# Patient Record
Sex: Female | Born: 1955 | Race: White | Hispanic: No | Smoking: Never smoker
Health system: Southern US, Community
[De-identification: ages and names within clinical notes are randomized; demographics above are authoritative.]

## PROBLEM LIST (undated history)

## (undated) DIAGNOSIS — C801 Malignant (primary) neoplasm, unspecified: Secondary | ICD-10-CM

## (undated) DIAGNOSIS — T7840XA Allergy, unspecified, initial encounter: Secondary | ICD-10-CM

## (undated) DIAGNOSIS — E871 Hypo-osmolality and hyponatremia: Secondary | ICD-10-CM

## (undated) DIAGNOSIS — F419 Anxiety disorder, unspecified: Secondary | ICD-10-CM

## (undated) DIAGNOSIS — K7030 Alcoholic cirrhosis of liver without ascites: Secondary | ICD-10-CM

## (undated) DIAGNOSIS — M1990 Unspecified osteoarthritis, unspecified site: Secondary | ICD-10-CM

## (undated) DIAGNOSIS — I8500 Esophageal varices without bleeding: Secondary | ICD-10-CM

## (undated) DIAGNOSIS — K219 Gastro-esophageal reflux disease without esophagitis: Secondary | ICD-10-CM

## (undated) DIAGNOSIS — L409 Psoriasis, unspecified: Secondary | ICD-10-CM

## (undated) DIAGNOSIS — I10 Essential (primary) hypertension: Secondary | ICD-10-CM

## (undated) HISTORY — DX: Gastro-esophageal reflux disease without esophagitis: K21.9

## (undated) HISTORY — DX: Psoriasis, unspecified: L40.9

## (undated) HISTORY — PX: UPPER GASTROINTESTINAL ENDOSCOPY: SHX188

## (undated) HISTORY — DX: Anxiety disorder, unspecified: F41.9

## (undated) HISTORY — DX: Unspecified osteoarthritis, unspecified site: M19.90

## (undated) HISTORY — PX: BLADDER SURGERY: SHX569

## (undated) HISTORY — DX: Hypo-osmolality and hyponatremia: E87.1

## (undated) HISTORY — DX: Malignant (primary) neoplasm, unspecified: C80.1

## (undated) HISTORY — DX: Esophageal varices without bleeding: I85.00

## (undated) HISTORY — PX: BREAST REDUCTION SURGERY: SHX8

## (undated) HISTORY — DX: Allergy, unspecified, initial encounter: T78.40XA

## (undated) HISTORY — DX: Essential (primary) hypertension: I10

## (undated) HISTORY — DX: Alcoholic cirrhosis of liver without ascites: K70.30

## (undated) HISTORY — PX: BASAL CELL CARCINOMA EXCISION: SHX1214

## (undated) HISTORY — PX: REDUCTION MAMMAPLASTY: SUR839

## (undated) HISTORY — PX: COSMETIC SURGERY: SHX468

## (undated) HISTORY — PX: COLONOSCOPY: SHX174

---

## 2002-06-13 ENCOUNTER — Other Ambulatory Visit: Admission: RE | Admit: 2002-06-13 | Discharge: 2002-06-13 | Payer: Self-pay | Admitting: Family Medicine

## 2002-06-19 ENCOUNTER — Encounter: Admission: RE | Admit: 2002-06-19 | Discharge: 2002-06-19 | Payer: Self-pay | Admitting: Family Medicine

## 2002-06-19 ENCOUNTER — Encounter: Payer: Self-pay | Admitting: Family Medicine

## 2002-06-27 ENCOUNTER — Encounter: Payer: Self-pay | Admitting: Family Medicine

## 2002-06-27 ENCOUNTER — Encounter: Admission: RE | Admit: 2002-06-27 | Discharge: 2002-06-27 | Payer: Self-pay | Admitting: Family Medicine

## 2002-07-27 ENCOUNTER — Encounter: Payer: Self-pay | Admitting: Internal Medicine

## 2002-07-27 ENCOUNTER — Ambulatory Visit (HOSPITAL_COMMUNITY): Admission: RE | Admit: 2002-07-27 | Discharge: 2002-07-27 | Payer: Self-pay | Admitting: Internal Medicine

## 2003-07-31 ENCOUNTER — Other Ambulatory Visit: Admission: RE | Admit: 2003-07-31 | Discharge: 2003-07-31 | Payer: Self-pay | Admitting: Family Medicine

## 2003-08-07 ENCOUNTER — Encounter: Admission: RE | Admit: 2003-08-07 | Discharge: 2003-08-07 | Payer: Self-pay | Admitting: Family Medicine

## 2004-05-26 ENCOUNTER — Ambulatory Visit: Payer: Self-pay | Admitting: Family Medicine

## 2004-07-28 ENCOUNTER — Ambulatory Visit: Payer: Self-pay | Admitting: Family Medicine

## 2004-07-31 ENCOUNTER — Other Ambulatory Visit: Admission: RE | Admit: 2004-07-31 | Discharge: 2004-07-31 | Payer: Self-pay | Admitting: Family Medicine

## 2004-07-31 ENCOUNTER — Ambulatory Visit: Payer: Self-pay | Admitting: Family Medicine

## 2004-08-25 ENCOUNTER — Encounter: Admission: RE | Admit: 2004-08-25 | Discharge: 2004-08-25 | Payer: Self-pay | Admitting: Family Medicine

## 2005-08-27 ENCOUNTER — Encounter: Admission: RE | Admit: 2005-08-27 | Discharge: 2005-08-27 | Payer: Self-pay | Admitting: Family Medicine

## 2006-04-21 ENCOUNTER — Ambulatory Visit: Payer: Self-pay | Admitting: Family Medicine

## 2006-06-03 ENCOUNTER — Ambulatory Visit: Payer: Self-pay | Admitting: Family Medicine

## 2006-06-11 ENCOUNTER — Ambulatory Visit: Payer: Self-pay | Admitting: Family Medicine

## 2006-07-30 ENCOUNTER — Ambulatory Visit: Payer: Self-pay | Admitting: Family Medicine

## 2006-07-30 LAB — CONVERTED CEMR LAB
ALT: 43 units/L — ABNORMAL HIGH (ref 0–40)
AST: 300 units/L — ABNORMAL HIGH (ref 0–37)
Albumin: 3.9 g/dL (ref 3.5–5.2)
Alkaline Phosphatase: 122 units/L — ABNORMAL HIGH (ref 39–117)
BUN: 9 mg/dL (ref 6–23)
Basophils Absolute: 0.4 10*3/uL — ABNORMAL HIGH (ref 0.0–0.1)
Basophils Relative: 4.1 % — ABNORMAL HIGH (ref 0.0–1.0)
CO2: 30 meq/L (ref 19–32)
Calcium: 9.2 mg/dL (ref 8.4–10.5)
Chloride: 98 meq/L (ref 96–112)
Cholesterol: 349 mg/dL (ref 0–200)
Creatinine, Ser: 0.8 mg/dL (ref 0.4–1.2)
Direct LDL: 235.3 mg/dL
Eosinophils Absolute: 0.1 10*3/uL (ref 0.0–0.6)
Eosinophils Relative: 0.6 % (ref 0.0–5.0)
GFR calc Af Amer: 97 mL/min
GFR calc non Af Amer: 80 mL/min
Glucose, Bld: 107 mg/dL — ABNORMAL HIGH (ref 70–99)
HCT: 37.1 % (ref 36.0–46.0)
HDL: 35.8 mg/dL — ABNORMAL LOW (ref >39.0)
Hemoglobin: 13 g/dL (ref 12.0–15.0)
Hgb A1c MFr Bld: 4.8 % (ref 4.6–6.0)
Lymphocytes Relative: 21.3 % (ref 12.0–46.0)
MCHC: 35.1 g/dL (ref 30.0–36.0)
MCV: 115.2 fL — ABNORMAL HIGH (ref 78.0–100.0)
Monocytes Absolute: 0.8 10*3/uL — ABNORMAL HIGH (ref 0.2–0.7)
Monocytes Relative: 8.3 % (ref 3.0–11.0)
Neutro Abs: 6 10*3/uL (ref 1.4–7.7)
Neutrophils Relative %: 65.7 % (ref 43.0–77.0)
Platelets: 35 10*3/uL — CL (ref 150–400)
Potassium: 4 meq/L (ref 3.5–5.1)
RBC: 3.22 M/uL — ABNORMAL LOW (ref 3.87–5.11)
RDW: 15.2 % — ABNORMAL HIGH (ref 11.5–14.6)
Sodium: 141 meq/L (ref 135–145)
TSH: 10.57 microintl units/mL — ABNORMAL HIGH (ref 0.35–5.50)
Total Bilirubin: 10.7 mg/dL — ABNORMAL HIGH (ref 0.3–1.2)
Total CHOL/HDL Ratio: 9.7
Total Protein: 8 g/dL (ref 6.0–8.3)
Triglycerides: 270 mg/dL (ref 0–149)
VLDL: 54 mg/dL — ABNORMAL HIGH (ref 0–40)
WBC: 9.3 10*3/uL (ref 4.5–10.5)

## 2006-08-06 ENCOUNTER — Ambulatory Visit: Payer: Self-pay | Admitting: Family Medicine

## 2006-09-14 ENCOUNTER — Ambulatory Visit: Payer: Self-pay | Admitting: Family Medicine

## 2006-10-15 ENCOUNTER — Inpatient Hospital Stay (HOSPITAL_COMMUNITY): Admission: EM | Admit: 2006-10-15 | Discharge: 2006-10-21 | Payer: Self-pay | Admitting: Emergency Medicine

## 2006-10-15 ENCOUNTER — Ambulatory Visit: Payer: Self-pay | Admitting: Internal Medicine

## 2006-10-16 ENCOUNTER — Encounter (INDEPENDENT_AMBULATORY_CARE_PROVIDER_SITE_OTHER): Payer: Self-pay | Admitting: Specialist

## 2006-10-22 ENCOUNTER — Ambulatory Visit: Payer: Self-pay | Admitting: Gastroenterology

## 2006-11-01 ENCOUNTER — Ambulatory Visit: Payer: Self-pay | Admitting: Gastroenterology

## 2006-11-01 LAB — CONVERTED CEMR LAB
ALT: 53 units/L — ABNORMAL HIGH (ref 0–40)
AST: 82 units/L — ABNORMAL HIGH (ref 0–37)
Albumin: 3 g/dL — ABNORMAL LOW (ref 3.5–5.2)
Alkaline Phosphatase: 167 units/L — ABNORMAL HIGH (ref 39–117)
BUN: 12 mg/dL (ref 6–23)
CO2: 31 meq/L (ref 19–32)
Calcium: 8.7 mg/dL (ref 8.4–10.5)
Chloride: 92 meq/L — ABNORMAL LOW (ref 96–112)
Creatinine, Ser: 1 mg/dL (ref 0.4–1.2)
GFR calc Af Amer: 75 mL/min
GFR calc non Af Amer: 62 mL/min
Glucose, Bld: 143 mg/dL — ABNORMAL HIGH (ref 70–99)
HCT: 32.4 % — ABNORMAL LOW (ref 36.0–46.0)
Hemoglobin: 11.2 g/dL — ABNORMAL LOW (ref 12.0–15.0)
INR: 2.5 — ABNORMAL HIGH (ref 0.9–2.0)
MCHC: 34.5 g/dL (ref 30.0–36.0)
MCV: 121.7 fL — ABNORMAL HIGH (ref 78.0–100.0)
Platelets: 173 10*3/uL (ref 150–400)
Potassium: 3.7 meq/L (ref 3.5–5.1)
Prothrombin Time: 19.9 s — ABNORMAL HIGH (ref 10.0–14.0)
RBC: 2.66 M/uL — ABNORMAL LOW (ref 3.87–5.11)
RDW: 16.4 % — ABNORMAL HIGH (ref 11.5–14.6)
Sodium: 132 meq/L — ABNORMAL LOW (ref 135–145)
Total Bilirubin: 21.3 mg/dL — ABNORMAL HIGH (ref 0.3–1.2)
Total Protein: 7.1 g/dL (ref 6.0–8.3)
WBC: 21.5 10*3/uL (ref 4.5–10.5)

## 2006-11-10 ENCOUNTER — Ambulatory Visit: Payer: Self-pay | Admitting: Gastroenterology

## 2006-11-18 ENCOUNTER — Ambulatory Visit: Payer: Self-pay | Admitting: Gastroenterology

## 2006-11-18 LAB — CONVERTED CEMR LAB
ALT: 67 units/L — ABNORMAL HIGH (ref 0–40)
AST: 89 units/L — ABNORMAL HIGH (ref 0–37)
Albumin: 3 g/dL — ABNORMAL LOW (ref 3.5–5.2)
Alkaline Phosphatase: 155 units/L — ABNORMAL HIGH (ref 39–117)
BUN: 14 mg/dL (ref 6–23)
Bilirubin, Direct: 6.2 mg/dL — ABNORMAL HIGH (ref 0.0–0.3)
CO2: 25 meq/L (ref 19–32)
Calcium: 8.6 mg/dL (ref 8.4–10.5)
Chloride: 85 meq/L — ABNORMAL LOW (ref 96–112)
Creatinine, Ser: 0.9 mg/dL (ref 0.4–1.2)
GFR calc Af Amer: 85 mL/min
GFR calc non Af Amer: 70 mL/min
Glucose, Bld: 119 mg/dL — ABNORMAL HIGH (ref 70–99)
INR: 2.2 — ABNORMAL HIGH (ref 0.9–2.0)
Potassium: 5.1 meq/L (ref 3.5–5.1)
Prothrombin Time: 18.4 s — ABNORMAL HIGH (ref 10.0–14.0)
Sodium: 121 meq/L — ABNORMAL LOW (ref 135–145)
Total Bilirubin: 14.1 mg/dL — ABNORMAL HIGH (ref 0.3–1.2)
Total Protein: 6.8 g/dL (ref 6.0–8.3)

## 2006-11-26 ENCOUNTER — Ambulatory Visit: Payer: Self-pay | Admitting: Gastroenterology

## 2006-11-26 LAB — CONVERTED CEMR LAB
BUN: 7 mg/dL (ref 6–23)
CO2: 25 meq/L (ref 19–32)
Calcium: 8.6 mg/dL (ref 8.4–10.5)
Chloride: 89 meq/L — ABNORMAL LOW (ref 96–112)
Creatinine, Ser: 0.7 mg/dL (ref 0.4–1.2)
GFR calc Af Amer: 113 mL/min
GFR calc non Af Amer: 93 mL/min
Glucose, Bld: 139 mg/dL — ABNORMAL HIGH (ref 70–99)
Potassium: 4.5 meq/L (ref 3.5–5.1)
Sodium: 120 meq/L — CL (ref 135–145)

## 2006-11-30 ENCOUNTER — Ambulatory Visit: Payer: Self-pay | Admitting: Internal Medicine

## 2006-12-03 ENCOUNTER — Ambulatory Visit: Payer: Self-pay | Admitting: Gastroenterology

## 2006-12-03 LAB — CONVERTED CEMR LAB
ALT: 30 units/L (ref 0–40)
AST: 59 units/L — ABNORMAL HIGH (ref 0–37)
Albumin: 2.4 g/dL — ABNORMAL LOW (ref 3.5–5.2)
Alkaline Phosphatase: 149 units/L — ABNORMAL HIGH (ref 39–117)
BUN: 10 mg/dL (ref 6–23)
Basophils Absolute: 0 10*3/uL (ref 0.0–0.1)
Basophils Relative: 0 % (ref 0.0–1.0)
Bilirubin, Direct: 3.5 mg/dL — ABNORMAL HIGH (ref 0.0–0.3)
CO2: 26 meq/L (ref 19–32)
Calcium: 8.5 mg/dL (ref 8.4–10.5)
Chloride: 90 meq/L — ABNORMAL LOW (ref 96–112)
Creatinine, Ser: 0.7 mg/dL (ref 0.4–1.2)
Eosinophils Absolute: 0.1 10*3/uL (ref 0.0–0.6)
Eosinophils Relative: 0.5 % (ref 0.0–5.0)
GFR calc Af Amer: 113 mL/min
GFR calc non Af Amer: 93 mL/min
Glucose, Bld: 137 mg/dL — ABNORMAL HIGH (ref 70–99)
HCT: 34.2 % — ABNORMAL LOW (ref 36.0–46.0)
Hemoglobin: 12.1 g/dL (ref 12.0–15.0)
INR: 2.1 — ABNORMAL HIGH (ref 0.9–2.0)
Lymphocytes Relative: 8.4 % — ABNORMAL LOW (ref 12.0–46.0)
MCHC: 35.5 g/dL (ref 30.0–36.0)
MCV: 114.9 fL — ABNORMAL HIGH (ref 78.0–100.0)
Monocytes Absolute: 0.7 10*3/uL (ref 0.2–0.7)
Monocytes Relative: 4.7 % (ref 3.0–11.0)
Neutro Abs: 11.9 10*3/uL — ABNORMAL HIGH (ref 1.4–7.7)
Neutrophils Relative %: 86.4 % — ABNORMAL HIGH (ref 43.0–77.0)
Platelets: 160 10*3/uL (ref 150–400)
Potassium: 5.3 meq/L — ABNORMAL HIGH (ref 3.5–5.1)
Prothrombin Time: 18.2 s — ABNORMAL HIGH (ref 10.0–14.0)
RBC: 2.97 M/uL — ABNORMAL LOW (ref 3.87–5.11)
RDW: 13.8 % (ref 11.5–14.6)
Sodium: 121 meq/L — ABNORMAL LOW (ref 135–145)
Total Bilirubin: 7.8 mg/dL — ABNORMAL HIGH (ref 0.3–1.2)
Total Protein: 6.1 g/dL (ref 6.0–8.3)
WBC: 13.9 10*3/uL — ABNORMAL HIGH (ref 4.5–10.5)

## 2006-12-07 ENCOUNTER — Ambulatory Visit: Payer: Self-pay | Admitting: Gastroenterology

## 2006-12-14 ENCOUNTER — Ambulatory Visit: Payer: Self-pay | Admitting: Gastroenterology

## 2006-12-14 LAB — CONVERTED CEMR LAB
BUN: 8 mg/dL (ref 6–23)
CO2: 25 meq/L (ref 19–32)
Calcium: 8.8 mg/dL (ref 8.4–10.5)
Chloride: 85 meq/L — ABNORMAL LOW (ref 96–112)
Creatinine, Ser: 0.7 mg/dL (ref 0.4–1.2)
GFR calc Af Amer: 113 mL/min
GFR calc non Af Amer: 93 mL/min
Glucose, Bld: 114 mg/dL — ABNORMAL HIGH (ref 70–99)
Potassium: 4.5 meq/L (ref 3.5–5.1)
Sodium: 118 meq/L — CL (ref 135–145)

## 2006-12-20 ENCOUNTER — Ambulatory Visit: Payer: Self-pay | Admitting: Gastroenterology

## 2006-12-20 LAB — CONVERTED CEMR LAB
ALT: 25 units/L (ref 0–40)
AST: 73 units/L — ABNORMAL HIGH (ref 0–37)
Albumin: 2.4 g/dL — ABNORMAL LOW (ref 3.5–5.2)
Alkaline Phosphatase: 192 units/L — ABNORMAL HIGH (ref 39–117)
BUN: 12 mg/dL (ref 6–23)
CO2: 27 meq/L (ref 19–32)
Calcium: 8.3 mg/dL — ABNORMAL LOW (ref 8.4–10.5)
Chloride: 91 meq/L — ABNORMAL LOW (ref 96–112)
Creatinine, Ser: 0.7 mg/dL (ref 0.4–1.2)
GFR calc Af Amer: 113 mL/min
GFR calc non Af Amer: 93 mL/min
Glucose, Bld: 120 mg/dL — ABNORMAL HIGH (ref 70–99)
Potassium: 4.2 meq/L (ref 3.5–5.1)
Sodium: 123 meq/L — ABNORMAL LOW (ref 135–145)
Total Bilirubin: 5.5 mg/dL — ABNORMAL HIGH (ref 0.3–1.2)
Total Protein: 6 g/dL (ref 6.0–8.3)

## 2006-12-21 ENCOUNTER — Ambulatory Visit: Payer: Self-pay | Admitting: Gastroenterology

## 2006-12-29 ENCOUNTER — Ambulatory Visit: Payer: Self-pay | Admitting: Gastroenterology

## 2006-12-29 LAB — CONVERTED CEMR LAB
ALT: 25 units/L (ref 0–35)
AST: 71 units/L — ABNORMAL HIGH (ref 0–37)
Albumin: 2.3 g/dL — ABNORMAL LOW (ref 3.5–5.2)
Alkaline Phosphatase: 124 units/L — ABNORMAL HIGH (ref 39–117)
BUN: 9 mg/dL (ref 6–23)
Basophils Absolute: 0 10*3/uL (ref 0.0–0.1)
Basophils Relative: 0.2 % (ref 0.0–1.0)
Bilirubin, Direct: 2.2 mg/dL — ABNORMAL HIGH (ref 0.0–0.3)
CO2: 31 meq/L (ref 19–32)
Calcium: 8.6 mg/dL (ref 8.4–10.5)
Chloride: 92 meq/L — ABNORMAL LOW (ref 96–112)
Creatinine, Ser: 0.8 mg/dL (ref 0.4–1.2)
Eosinophils Absolute: 0.1 10*3/uL (ref 0.0–0.6)
Eosinophils Relative: 0.9 % (ref 0.0–5.0)
GFR calc Af Amer: 97 mL/min
GFR calc non Af Amer: 80 mL/min
Glucose, Bld: 105 mg/dL — ABNORMAL HIGH (ref 70–99)
HCT: 31.5 % — ABNORMAL LOW (ref 36.0–46.0)
Hemoglobin: 10.7 g/dL — ABNORMAL LOW (ref 12.0–15.0)
INR: 2.1 — ABNORMAL HIGH (ref 0.9–2.0)
Lymphocytes Relative: 28.2 % (ref 12.0–46.0)
MCHC: 34.1 g/dL (ref 30.0–36.0)
MCV: 112.2 fL — ABNORMAL HIGH (ref 78.0–100.0)
Monocytes Absolute: 1.3 10*3/uL — ABNORMAL HIGH (ref 0.2–0.7)
Monocytes Relative: 13.7 % — ABNORMAL HIGH (ref 3.0–11.0)
Neutro Abs: 5.6 10*3/uL (ref 1.4–7.7)
Neutrophils Relative %: 57 % (ref 43.0–77.0)
Platelets: 199 10*3/uL (ref 150–400)
Potassium: 3.3 meq/L — ABNORMAL LOW (ref 3.5–5.1)
Prothrombin Time: 18.2 s — ABNORMAL HIGH (ref 10.0–14.0)
RBC: 2.81 M/uL — ABNORMAL LOW (ref 3.87–5.11)
RDW: 14.5 % (ref 11.5–14.6)
Sodium: 135 meq/L (ref 135–145)
Total Bilirubin: 5.9 mg/dL — ABNORMAL HIGH (ref 0.3–1.2)
Total Protein: 6 g/dL (ref 6.0–8.3)
WBC: 9.8 10*3/uL (ref 4.5–10.5)

## 2007-01-06 ENCOUNTER — Ambulatory Visit: Payer: Self-pay | Admitting: Gastroenterology

## 2007-01-06 LAB — CONVERTED CEMR LAB
BUN: 9 mg/dL (ref 6–23)
CO2: 30 meq/L (ref 19–32)
Calcium: 8.8 mg/dL (ref 8.4–10.5)
Chloride: 87 meq/L — ABNORMAL LOW (ref 96–112)
Creatinine, Ser: 0.8 mg/dL (ref 0.4–1.2)
GFR calc Af Amer: 97 mL/min
GFR calc non Af Amer: 80 mL/min
Glucose, Bld: 111 mg/dL — ABNORMAL HIGH (ref 70–99)
Potassium: 2.8 meq/L — ABNORMAL LOW (ref 3.5–5.1)
Sodium: 134 meq/L — ABNORMAL LOW (ref 135–145)

## 2007-01-10 ENCOUNTER — Ambulatory Visit: Payer: Self-pay | Admitting: Gastroenterology

## 2007-01-10 LAB — CONVERTED CEMR LAB
ALT: 24 units/L (ref 0–35)
AST: 67 units/L — ABNORMAL HIGH (ref 0–37)
Albumin: 2.4 g/dL — ABNORMAL LOW (ref 3.5–5.2)
Alkaline Phosphatase: 149 units/L — ABNORMAL HIGH (ref 39–117)
BUN: 12 mg/dL (ref 6–23)
Basophils Absolute: 0.1 10*3/uL (ref 0.0–0.1)
Basophils Relative: 1.2 % — ABNORMAL HIGH (ref 0.0–1.0)
Bilirubin, Direct: 2 mg/dL — ABNORMAL HIGH (ref 0.0–0.3)
CO2: 36 meq/L — ABNORMAL HIGH (ref 19–32)
Calcium: 9 mg/dL (ref 8.4–10.5)
Ceruloplasmin: 31 mg/dL (ref 21–63)
Chloride: 94 meq/L — ABNORMAL LOW (ref 96–112)
Creatinine, Ser: 1 mg/dL (ref 0.4–1.2)
Eosinophils Absolute: 0.1 10*3/uL (ref 0.0–0.6)
Eosinophils Relative: 1.1 % (ref 0.0–5.0)
Ferritin: 254.1 ng/mL (ref 10.0–291.0)
GFR calc Af Amer: 75 mL/min
GFR calc non Af Amer: 62 mL/min
Glucose, Bld: 106 mg/dL — ABNORMAL HIGH (ref 70–99)
HCT: 33.2 % — ABNORMAL LOW (ref 36.0–46.0)
HCV Ab: NEGATIVE
Hemoglobin: 11.4 g/dL — ABNORMAL LOW (ref 12.0–15.0)
Hep B S Ab: NEGATIVE
Hepatitis B Surface Ag: NEGATIVE
Iron: 84 ug/dL (ref 42–145)
Lymphocytes Relative: 18.5 % (ref 12.0–46.0)
MCHC: 34.3 g/dL (ref 30.0–36.0)
MCV: 109.8 fL — ABNORMAL HIGH (ref 78.0–100.0)
Monocytes Absolute: 1.4 10*3/uL — ABNORMAL HIGH (ref 0.2–0.7)
Monocytes Relative: 13.3 % — ABNORMAL HIGH (ref 3.0–11.0)
Neutro Abs: 7.2 10*3/uL (ref 1.4–7.7)
Neutrophils Relative %: 65.9 % (ref 43.0–77.0)
Platelets: 288 10*3/uL (ref 150–400)
Potassium: 2.8 meq/L — ABNORMAL LOW (ref 3.5–5.1)
RBC: 3.03 M/uL — ABNORMAL LOW (ref 3.87–5.11)
RDW: 14.6 % (ref 11.5–14.6)
Saturation Ratios: 37.4 % (ref 20.0–50.0)
Sodium: 137 meq/L (ref 135–145)
Total Bilirubin: 5.1 mg/dL — ABNORMAL HIGH (ref 0.3–1.2)
Total Protein: 6.6 g/dL (ref 6.0–8.3)
Transferrin: 160.6 mg/dL — ABNORMAL LOW (ref >=212.0)
WBC: 10.8 10*3/uL — ABNORMAL HIGH (ref 4.5–10.5)

## 2007-01-11 ENCOUNTER — Ambulatory Visit (HOSPITAL_COMMUNITY): Admission: RE | Admit: 2007-01-11 | Discharge: 2007-01-11 | Payer: Self-pay | Admitting: Gastroenterology

## 2007-01-18 ENCOUNTER — Ambulatory Visit: Payer: Self-pay | Admitting: Gastroenterology

## 2007-01-18 LAB — CONVERTED CEMR LAB
CO2: 34 meq/L — ABNORMAL HIGH (ref 19–32)
Calcium: 9.6 mg/dL (ref 8.4–10.5)
GFR calc Af Amer: 85 mL/min
GFR calc non Af Amer: 70 mL/min
Glucose, Bld: 105 mg/dL — ABNORMAL HIGH (ref 70–99)
Sodium: 140 meq/L (ref 135–145)

## 2007-02-03 ENCOUNTER — Ambulatory Visit: Payer: Self-pay | Admitting: Gastroenterology

## 2007-02-03 LAB — CONVERTED CEMR LAB
Chloride: 98 meq/L (ref 96–112)
Creatinine, Ser: 0.7 mg/dL (ref 0.4–1.2)
GFR calc non Af Amer: 93 mL/min
Glucose, Bld: 145 mg/dL — ABNORMAL HIGH (ref 70–99)
Potassium: 3.6 meq/L (ref 3.5–5.1)
Sodium: 137 meq/L (ref 135–145)

## 2007-02-04 ENCOUNTER — Ambulatory Visit: Payer: Self-pay | Admitting: Gastroenterology

## 2007-02-11 ENCOUNTER — Ambulatory Visit: Payer: Self-pay | Admitting: Gastroenterology

## 2007-02-11 LAB — CONVERTED CEMR LAB
CO2: 31 meq/L (ref 19–32)
Creatinine, Ser: 0.9 mg/dL (ref 0.4–1.2)
GFR calc Af Amer: 85 mL/min
Potassium: 3.2 meq/L — ABNORMAL LOW (ref 3.5–5.1)
Sodium: 133 meq/L — ABNORMAL LOW (ref 135–145)

## 2007-02-18 ENCOUNTER — Ambulatory Visit: Payer: Self-pay | Admitting: Gastroenterology

## 2007-02-18 LAB — CONVERTED CEMR LAB
CO2: 35 meq/L — ABNORMAL HIGH (ref 19–32)
GFR calc Af Amer: 97 mL/min
GFR calc non Af Amer: 80 mL/min
Glucose, Bld: 105 mg/dL — ABNORMAL HIGH (ref 70–99)
Potassium: 4.3 meq/L (ref 3.5–5.1)

## 2007-03-09 ENCOUNTER — Ambulatory Visit: Payer: Self-pay | Admitting: Gastroenterology

## 2007-03-09 LAB — CONVERTED CEMR LAB
ALT: 33 units/L (ref 0–35)
Albumin: 3.5 g/dL (ref 3.5–5.2)
Alkaline Phosphatase: 213 units/L — ABNORMAL HIGH (ref 39–117)
BUN: 11 mg/dL (ref 6–23)
Basophils Absolute: 0 10*3/uL (ref 0.0–0.1)
Bilirubin, Direct: 1 mg/dL — ABNORMAL HIGH (ref 0.0–0.3)
Calcium: 10.4 mg/dL (ref 8.4–10.5)
Eosinophils Absolute: 0.2 10*3/uL (ref 0.0–0.6)
GFR calc Af Amer: 97 mL/min
GFR calc non Af Amer: 80 mL/min
Glucose, Bld: 115 mg/dL — ABNORMAL HIGH (ref 70–99)
Lymphocytes Relative: 20.4 % (ref 12.0–46.0)
MCHC: 34.4 g/dL (ref 30.0–36.0)
MCV: 107.9 fL — ABNORMAL HIGH (ref 78.0–100.0)
Monocytes Relative: 9.2 % (ref 3.0–11.0)
Neutro Abs: 6.5 10*3/uL (ref 1.4–7.7)
Platelets: 171 10*3/uL (ref 150–400)

## 2007-04-08 ENCOUNTER — Ambulatory Visit: Payer: Self-pay | Admitting: Gastroenterology

## 2007-04-22 ENCOUNTER — Ambulatory Visit: Payer: Self-pay | Admitting: Gastroenterology

## 2007-04-29 ENCOUNTER — Ambulatory Visit: Payer: Self-pay | Admitting: Gastroenterology

## 2007-05-04 ENCOUNTER — Ambulatory Visit: Payer: Self-pay | Admitting: Psychology

## 2007-05-20 ENCOUNTER — Ambulatory Visit: Payer: Self-pay | Admitting: Psychology

## 2007-05-20 ENCOUNTER — Ambulatory Visit: Payer: Self-pay | Admitting: Gastroenterology

## 2007-05-20 LAB — CONVERTED CEMR LAB
Benzodiazepines.: NEGATIVE
Creatinine,U: 55.1 mg/dL
Marijuana Metabolite: NEGATIVE
Phencyclidine (PCP): NEGATIVE
Propoxyphene: NEGATIVE

## 2007-05-31 ENCOUNTER — Ambulatory Visit: Payer: Self-pay | Admitting: Psychology

## 2007-06-20 ENCOUNTER — Ambulatory Visit: Payer: Self-pay | Admitting: Gastroenterology

## 2007-06-20 LAB — CONVERTED CEMR LAB
Alcohol, Ethyl (B): <5 mg/dL (ref 0–10)
Cocaine Metabolites: NEGATIVE
Creatinine,U: 48.2 mg/dL
Opiate Screen, Urine: NEGATIVE
Phencyclidine (PCP): NEGATIVE

## 2007-06-22 DIAGNOSIS — K7030 Alcoholic cirrhosis of liver without ascites: Secondary | ICD-10-CM | POA: Insufficient documentation

## 2007-06-22 DIAGNOSIS — L408 Other psoriasis: Secondary | ICD-10-CM | POA: Insufficient documentation

## 2007-06-22 DIAGNOSIS — J45909 Unspecified asthma, uncomplicated: Secondary | ICD-10-CM | POA: Insufficient documentation

## 2007-06-23 ENCOUNTER — Ambulatory Visit: Payer: Self-pay | Admitting: Psychology

## 2007-07-04 ENCOUNTER — Ambulatory Visit: Payer: Self-pay | Admitting: Gastroenterology

## 2007-07-04 LAB — CONVERTED CEMR LAB
ALT: 24 units/L (ref 0–35)
Alkaline Phosphatase: 208 units/L — ABNORMAL HIGH (ref 39–117)
Glucose, Bld: 98 mg/dL (ref 70–99)
Potassium: 3.9 meq/L (ref 3.5–5.1)
Sodium: 140 meq/L (ref 135–145)
Total Bilirubin: 2.7 mg/dL — ABNORMAL HIGH (ref 0.3–1.2)
Total Protein: 7 g/dL (ref 6.0–8.3)

## 2007-07-21 ENCOUNTER — Ambulatory Visit: Payer: Self-pay | Admitting: Psychology

## 2007-07-21 ENCOUNTER — Ambulatory Visit: Payer: Self-pay | Admitting: Gastroenterology

## 2007-07-21 LAB — CONVERTED CEMR LAB
Amphetamine Screen, Ur: NEGATIVE
Barbiturate Quant, Ur: NEGATIVE
Marijuana Metabolite: NEGATIVE
Methadone: NEGATIVE

## 2007-08-04 ENCOUNTER — Ambulatory Visit: Payer: Self-pay | Admitting: Psychology

## 2007-08-18 ENCOUNTER — Ambulatory Visit: Payer: Self-pay | Admitting: Gastroenterology

## 2007-08-18 LAB — CONVERTED CEMR LAB
Amphetamine Screen, Ur: NEGATIVE
Barbiturate Quant, Ur: NEGATIVE
Marijuana Metabolite: NEGATIVE
Methadone: NEGATIVE

## 2007-09-01 ENCOUNTER — Ambulatory Visit: Payer: Self-pay | Admitting: Psychology

## 2007-09-07 ENCOUNTER — Encounter: Admission: RE | Admit: 2007-09-07 | Discharge: 2007-09-07 | Payer: Self-pay | Admitting: Obstetrics and Gynecology

## 2007-09-09 DIAGNOSIS — K219 Gastro-esophageal reflux disease without esophagitis: Secondary | ICD-10-CM | POA: Insufficient documentation

## 2007-09-09 DIAGNOSIS — E871 Hypo-osmolality and hyponatremia: Secondary | ICD-10-CM | POA: Insufficient documentation

## 2007-09-09 DIAGNOSIS — I10 Essential (primary) hypertension: Secondary | ICD-10-CM | POA: Insufficient documentation

## 2007-09-20 ENCOUNTER — Ambulatory Visit: Payer: Self-pay | Admitting: Gastroenterology

## 2007-09-20 LAB — CONVERTED CEMR LAB
Amphetamine Screen, Ur: NEGATIVE
CO2: 34 meq/L — ABNORMAL HIGH (ref 19–32)
Calcium: 9.5 mg/dL (ref 8.4–10.5)
Creatinine, Ser: 0.7 mg/dL (ref 0.4–1.2)
GFR calc Af Amer: 113 mL/min
Glucose, Bld: 125 mg/dL — ABNORMAL HIGH (ref 70–99)
Marijuana Metabolite: NEGATIVE
Opiate Screen, Urine: NEGATIVE
Phencyclidine (PCP): NEGATIVE
Potassium: 3.7 meq/L (ref 3.5–5.1)
Propoxyphene: NEGATIVE

## 2007-09-29 ENCOUNTER — Ambulatory Visit: Payer: Self-pay | Admitting: Psychology

## 2007-10-19 ENCOUNTER — Ambulatory Visit: Payer: Self-pay | Admitting: Gastroenterology

## 2007-10-19 LAB — CONVERTED CEMR LAB
Amphetamine Screen, Ur: NEGATIVE
Cocaine Metabolites: NEGATIVE
Marijuana Metabolite: NEGATIVE
Opiate Screen, Urine: NEGATIVE
Phencyclidine (PCP): NEGATIVE
Propoxyphene: NEGATIVE

## 2007-10-27 ENCOUNTER — Ambulatory Visit: Payer: Self-pay | Admitting: Psychology

## 2007-11-15 ENCOUNTER — Encounter: Payer: Self-pay | Admitting: Gastroenterology

## 2007-12-26 ENCOUNTER — Ambulatory Visit: Payer: Self-pay | Admitting: Gastroenterology

## 2007-12-27 ENCOUNTER — Ambulatory Visit: Payer: Self-pay | Admitting: Gastroenterology

## 2007-12-27 LAB — CONVERTED CEMR LAB
Albumin: 3.9 g/dL (ref 3.5–5.2)
BUN: 12 mg/dL (ref 6–23)
Basophils Relative: 0.6 % (ref 0.0–1.0)
Creatinine, Ser: 0.8 mg/dL (ref 0.4–1.2)
Eosinophils Absolute: 0.3 10*3/uL (ref 0.0–0.7)
Eosinophils Relative: 3.8 % (ref 0.0–5.0)
GFR calc Af Amer: 96 mL/min
GFR calc non Af Amer: 80 mL/min
Glucose, Bld: 210 mg/dL — ABNORMAL HIGH (ref 70–99)
HCT: 39.4 % (ref 36.0–46.0)
Hemoglobin: 14 g/dL (ref 12.0–15.0)
INR: 1.3 — ABNORMAL HIGH (ref 0.8–1.0)
MCV: 100.7 fL — ABNORMAL HIGH (ref 78.0–100.0)
Monocytes Absolute: 0.7 10*3/uL (ref 0.1–1.0)
Monocytes Relative: 9.3 % (ref 3.0–12.0)
Neutro Abs: 3.6 10*3/uL (ref 1.4–7.7)
RBC: 3.92 M/uL (ref 3.87–5.11)
TSH: 2.95 microintl units/mL (ref 0.35–5.50)
Total Protein: 7.8 g/dL (ref 6.0–8.3)
WBC: 7.3 10*3/uL (ref 4.5–10.5)

## 2007-12-28 ENCOUNTER — Encounter: Payer: Self-pay | Admitting: Gastroenterology

## 2008-01-09 ENCOUNTER — Ambulatory Visit: Payer: Self-pay | Admitting: Gastroenterology

## 2008-01-10 LAB — CONVERTED CEMR LAB
BUN: 9 mg/dL (ref 6–23)
Chloride: 99 meq/L (ref 96–112)
Creatinine, Ser: 0.8 mg/dL (ref 0.4–1.2)
GFR calc Af Amer: 96 mL/min
GFR calc non Af Amer: 80 mL/min
Potassium: 4 meq/L (ref 3.5–5.1)

## 2008-02-03 ENCOUNTER — Ambulatory Visit: Payer: Self-pay | Admitting: Gastroenterology

## 2008-02-06 LAB — CONVERTED CEMR LAB
CO2: 29 meq/L (ref 19–32)
Calcium: 9.4 mg/dL (ref 8.4–10.5)
Chloride: 98 meq/L (ref 96–112)
Creatinine, Ser: 0.9 mg/dL (ref 0.4–1.2)
GFR calc non Af Amer: 70 mL/min
Sodium: 136 meq/L (ref 135–145)

## 2008-03-01 ENCOUNTER — Telehealth: Payer: Self-pay | Admitting: Gastroenterology

## 2008-03-05 ENCOUNTER — Ambulatory Visit: Payer: Self-pay | Admitting: Gastroenterology

## 2008-03-06 LAB — CONVERTED CEMR LAB
Calcium: 9.8 mg/dL (ref 8.4–10.5)
GFR calc Af Amer: 96 mL/min
Glucose, Bld: 147 mg/dL — ABNORMAL HIGH (ref 70–99)
Potassium: 3.9 meq/L (ref 3.5–5.1)
Sodium: 142 meq/L (ref 135–145)

## 2008-03-07 ENCOUNTER — Ambulatory Visit: Payer: Self-pay | Admitting: Gastroenterology

## 2008-03-08 ENCOUNTER — Encounter: Payer: Self-pay | Admitting: Gastroenterology

## 2008-03-09 ENCOUNTER — Ambulatory Visit: Payer: Self-pay | Admitting: Gastroenterology

## 2008-03-09 ENCOUNTER — Encounter: Payer: Self-pay | Admitting: Gastroenterology

## 2008-03-14 ENCOUNTER — Ambulatory Visit: Payer: Self-pay | Admitting: Gastroenterology

## 2008-03-15 LAB — CONVERTED CEMR LAB
ALT: 20 units/L (ref 0–35)
AST: 28 units/L (ref 0–37)
Alkaline Phosphatase: 174 units/L — ABNORMAL HIGH (ref 39–117)
Creatinine, Ser: 0.9 mg/dL (ref 0.4–1.2)
GFR calc Af Amer: 84 mL/min
INR: 1.3 — ABNORMAL HIGH (ref 0.8–1.0)
Prothrombin Time: 14.7 s — ABNORMAL HIGH (ref 10.9–13.3)
Sodium: 137 meq/L (ref 135–145)
Total Bilirubin: 2.1 mg/dL — ABNORMAL HIGH (ref 0.3–1.2)
Total Protein: 7.3 g/dL (ref 6.0–8.3)

## 2008-03-19 ENCOUNTER — Telehealth: Payer: Self-pay | Admitting: Gastroenterology

## 2008-03-21 ENCOUNTER — Encounter: Payer: Self-pay | Admitting: Gastroenterology

## 2008-03-21 DIAGNOSIS — R799 Abnormal finding of blood chemistry, unspecified: Secondary | ICD-10-CM | POA: Insufficient documentation

## 2008-03-30 ENCOUNTER — Ambulatory Visit (HOSPITAL_COMMUNITY): Admission: RE | Admit: 2008-03-30 | Discharge: 2008-03-30 | Payer: Self-pay | Admitting: Gastroenterology

## 2008-04-03 ENCOUNTER — Ambulatory Visit: Payer: Self-pay | Admitting: Psychology

## 2008-04-05 ENCOUNTER — Encounter: Payer: Self-pay | Admitting: Gastroenterology

## 2008-04-16 ENCOUNTER — Telehealth: Payer: Self-pay | Admitting: Gastroenterology

## 2008-04-16 ENCOUNTER — Ambulatory Visit: Payer: Self-pay | Admitting: Psychology

## 2008-05-01 ENCOUNTER — Ambulatory Visit: Payer: Self-pay | Admitting: Psychology

## 2008-05-14 ENCOUNTER — Encounter: Payer: Self-pay | Admitting: Gastroenterology

## 2008-05-21 ENCOUNTER — Telehealth: Payer: Self-pay | Admitting: Gastroenterology

## 2008-05-25 ENCOUNTER — Ambulatory Visit: Payer: Self-pay | Admitting: Gastroenterology

## 2008-05-29 ENCOUNTER — Ambulatory Visit: Payer: Self-pay | Admitting: Gastroenterology

## 2008-05-30 LAB — CONVERTED CEMR LAB
AST: 37 units/L (ref 0–37)
Bilirubin, Direct: 0.4 mg/dL — ABNORMAL HIGH (ref 0.0–0.3)
CO2: 31 meq/L (ref 19–32)
Chloride: 101 meq/L (ref 96–112)
Creatinine, Ser: 0.8 mg/dL (ref 0.4–1.2)
GFR calc non Af Amer: 80 mL/min
INR: 1.2 — ABNORMAL HIGH (ref 0.8–1.0)
Potassium: 4.2 meq/L (ref 3.5–5.1)
Prothrombin Time: 14.3 s — ABNORMAL HIGH (ref 10.9–13.3)
Sodium: 137 meq/L (ref 135–145)
Total Bilirubin: 1.6 mg/dL — ABNORMAL HIGH (ref 0.3–1.2)

## 2008-06-20 ENCOUNTER — Telehealth: Payer: Self-pay | Admitting: Gastroenterology

## 2008-06-26 ENCOUNTER — Ambulatory Visit: Payer: Self-pay | Admitting: Gastroenterology

## 2008-06-27 LAB — CONVERTED CEMR LAB
BUN: 11 mg/dL (ref 6–23)
CO2: 30 meq/L (ref 19–32)
Calcium: 9.7 mg/dL (ref 8.4–10.5)
Glucose, Bld: 106 mg/dL — ABNORMAL HIGH (ref 70–99)
Sodium: 137 meq/L (ref 135–145)

## 2008-07-25 ENCOUNTER — Telehealth: Payer: Self-pay | Admitting: Gastroenterology

## 2008-08-14 ENCOUNTER — Ambulatory Visit: Payer: Self-pay | Admitting: Gastroenterology

## 2008-09-07 ENCOUNTER — Encounter: Admission: RE | Admit: 2008-09-07 | Discharge: 2008-09-07 | Payer: Self-pay | Admitting: Obstetrics and Gynecology

## 2008-10-01 ENCOUNTER — Encounter: Payer: Self-pay | Admitting: Gastroenterology

## 2008-10-22 ENCOUNTER — Encounter: Payer: Self-pay | Admitting: Gastroenterology

## 2008-10-29 ENCOUNTER — Encounter: Payer: Self-pay | Admitting: Gastroenterology

## 2008-11-23 ENCOUNTER — Encounter (INDEPENDENT_AMBULATORY_CARE_PROVIDER_SITE_OTHER): Payer: Self-pay | Admitting: *Deleted

## 2008-12-10 ENCOUNTER — Encounter: Payer: Self-pay | Admitting: Gastroenterology

## 2009-01-09 ENCOUNTER — Ambulatory Visit: Payer: Self-pay | Admitting: Gastroenterology

## 2009-01-11 ENCOUNTER — Telehealth (INDEPENDENT_AMBULATORY_CARE_PROVIDER_SITE_OTHER): Payer: Self-pay | Admitting: *Deleted

## 2009-01-11 LAB — CONVERTED CEMR LAB
AST: 38 units/L — ABNORMAL HIGH (ref 0–37)
Alkaline Phosphatase: 142 units/L — ABNORMAL HIGH (ref 39–117)
BUN: 18 mg/dL (ref 6–23)
Basophils Relative: 0.4 % (ref 0.0–3.0)
Creatinine, Ser: 0.8 mg/dL (ref 0.4–1.2)
Eosinophils Absolute: 0.3 10*3/uL (ref 0.0–0.7)
Hemoglobin: 14.8 g/dL (ref 12.0–15.0)
INR: 1.3 — ABNORMAL HIGH (ref 0.8–1.0)
MCHC: 35.5 g/dL (ref 30.0–36.0)
MCV: 100.4 fL — ABNORMAL HIGH (ref 78.0–100.0)
Monocytes Absolute: 0.8 10*3/uL (ref 0.1–1.0)
Neutro Abs: 4.8 10*3/uL (ref 1.4–7.7)
Neutrophils Relative %: 52 % (ref 43.0–77.0)
RBC: 4.15 M/uL (ref 3.87–5.11)
RDW: 12.4 % (ref 11.5–14.6)

## 2009-01-14 ENCOUNTER — Ambulatory Visit: Payer: Self-pay | Admitting: Gastroenterology

## 2009-01-30 ENCOUNTER — Telehealth: Payer: Self-pay | Admitting: Gastroenterology

## 2009-03-21 ENCOUNTER — Ambulatory Visit (HOSPITAL_COMMUNITY): Admission: RE | Admit: 2009-03-21 | Discharge: 2009-03-21 | Payer: Self-pay | Admitting: Gastroenterology

## 2009-03-21 ENCOUNTER — Ambulatory Visit: Payer: Self-pay | Admitting: Gastroenterology

## 2009-03-26 ENCOUNTER — Encounter: Payer: Self-pay | Admitting: Gastroenterology

## 2009-03-29 ENCOUNTER — Telehealth: Payer: Self-pay | Admitting: Gastroenterology

## 2009-03-29 ENCOUNTER — Ambulatory Visit: Payer: Self-pay | Admitting: Psychology

## 2009-04-10 ENCOUNTER — Telehealth: Payer: Self-pay | Admitting: Gastroenterology

## 2009-04-12 ENCOUNTER — Ambulatory Visit: Payer: Self-pay | Admitting: Gastroenterology

## 2009-04-15 ENCOUNTER — Ambulatory Visit: Payer: Self-pay | Admitting: Gastroenterology

## 2009-04-16 LAB — CONVERTED CEMR LAB
ALT: 29 units/L (ref 0–35)
CO2: 20 meq/L (ref 19–32)
Calcium: 9.5 mg/dL (ref 8.4–10.5)
Chloride: 111 meq/L (ref 96–112)
GFR calc non Af Amer: 79.33 mL/min (ref >60)
Potassium: 5.2 meq/L — ABNORMAL HIGH (ref 3.5–5.1)
Sodium: 155 meq/L — ABNORMAL HIGH (ref 135–145)
Total Protein: 7.5 g/dL (ref 6.0–8.3)

## 2009-04-23 ENCOUNTER — Emergency Department (HOSPITAL_COMMUNITY): Admission: EM | Admit: 2009-04-23 | Discharge: 2009-04-23 | Payer: Self-pay | Admitting: Emergency Medicine

## 2009-04-26 ENCOUNTER — Ambulatory Visit: Payer: Self-pay | Admitting: Gastroenterology

## 2009-04-29 LAB — CONVERTED CEMR LAB
BUN: 14 mg/dL (ref 6–23)
GFR calc non Af Amer: 79.32 mL/min (ref >60)
Glucose, Bld: 77 mg/dL (ref 70–99)
Potassium: 4.3 meq/L (ref 3.5–5.1)

## 2009-05-16 ENCOUNTER — Encounter: Payer: Self-pay | Admitting: Gastroenterology

## 2009-06-26 ENCOUNTER — Encounter (INDEPENDENT_AMBULATORY_CARE_PROVIDER_SITE_OTHER): Payer: Self-pay | Admitting: *Deleted

## 2009-06-26 ENCOUNTER — Encounter: Payer: Self-pay | Admitting: Gastroenterology

## 2009-07-19 ENCOUNTER — Ambulatory Visit: Payer: Self-pay | Admitting: Psychology

## 2009-08-20 ENCOUNTER — Encounter: Payer: Self-pay | Admitting: Gastroenterology

## 2009-08-20 ENCOUNTER — Ambulatory Visit: Payer: Self-pay | Admitting: Psychology

## 2009-08-21 ENCOUNTER — Encounter: Payer: Self-pay | Admitting: Gastroenterology

## 2009-08-23 ENCOUNTER — Ambulatory Visit: Payer: Self-pay | Admitting: Gastroenterology

## 2009-09-09 ENCOUNTER — Encounter: Admission: RE | Admit: 2009-09-09 | Discharge: 2009-09-09 | Payer: Self-pay | Admitting: Obstetrics and Gynecology

## 2010-01-21 ENCOUNTER — Encounter: Payer: Self-pay | Admitting: Gastroenterology

## 2010-01-31 ENCOUNTER — Encounter (INDEPENDENT_AMBULATORY_CARE_PROVIDER_SITE_OTHER): Payer: Self-pay | Admitting: *Deleted

## 2010-01-31 ENCOUNTER — Telehealth (INDEPENDENT_AMBULATORY_CARE_PROVIDER_SITE_OTHER): Payer: Self-pay | Admitting: *Deleted

## 2010-01-31 DIAGNOSIS — I8500 Esophageal varices without bleeding: Secondary | ICD-10-CM | POA: Insufficient documentation

## 2010-03-27 ENCOUNTER — Ambulatory Visit (HOSPITAL_COMMUNITY): Admission: RE | Admit: 2010-03-27 | Discharge: 2010-03-27 | Payer: Self-pay | Admitting: Gastroenterology

## 2010-03-27 ENCOUNTER — Ambulatory Visit: Payer: Self-pay | Admitting: Gastroenterology

## 2010-04-30 ENCOUNTER — Encounter: Payer: Self-pay | Admitting: Gastroenterology

## 2010-05-01 ENCOUNTER — Telehealth: Payer: Self-pay | Admitting: Gastroenterology

## 2010-05-05 ENCOUNTER — Ambulatory Visit (HOSPITAL_COMMUNITY): Admission: RE | Admit: 2010-05-05 | Discharge: 2010-05-05 | Payer: Self-pay | Admitting: Gastroenterology

## 2010-07-09 ENCOUNTER — Emergency Department (HOSPITAL_COMMUNITY)
Admission: EM | Admit: 2010-07-09 | Discharge: 2010-07-09 | Payer: Self-pay | Source: Home / Self Care | Admitting: Emergency Medicine

## 2010-07-09 LAB — URINALYSIS, MICROSCOPIC ONLY
Bilirubin Urine: NEGATIVE
Ketones, ur: NEGATIVE mg/dL
Nitrite: NEGATIVE
Protein, ur: NEGATIVE mg/dL
Specific Gravity, Urine: 1.009 (ref 1.005–1.030)
Urine Glucose, Fasting: NEGATIVE mg/dL
Urobilinogen, UA: 1 mg/dL (ref 0.0–1.0)
pH: 7 (ref 5.0–8.0)

## 2010-07-09 LAB — POCT URINALYSIS DIPSTICK
Bilirubin Urine: NEGATIVE
Ketones, ur: NEGATIVE mg/dL
Nitrite: NEGATIVE
Protein, ur: NEGATIVE mg/dL
Specific Gravity, Urine: 1.015 (ref 1.005–1.030)
Urine Glucose, Fasting: NEGATIVE mg/dL
Urobilinogen, UA: 1 mg/dL (ref 0.0–1.0)
pH: 6.5 (ref 5.0–8.0)

## 2010-07-09 LAB — POCT I-STAT, CHEM 8
BUN: 10 mg/dL (ref 6–23)
Calcium, Ion: 1.18 mmol/L (ref 1.12–1.32)
Chloride: 100 mEq/L (ref 96–112)
Creatinine, Ser: 0.9 mg/dL (ref 0.4–1.2)
Glucose, Bld: 124 mg/dL — ABNORMAL HIGH (ref 70–99)
HCT: 48 % — ABNORMAL HIGH (ref 36.0–46.0)
Hemoglobin: 16.3 g/dL — ABNORMAL HIGH (ref 12.0–15.0)
Potassium: 3.8 mEq/L (ref 3.5–5.1)
Sodium: 140 mEq/L (ref 135–145)
TCO2: 32 mmol/L (ref 0–100)

## 2010-07-27 ENCOUNTER — Encounter: Payer: Self-pay | Admitting: Family Medicine

## 2010-08-05 NOTE — Miscellaneous (Signed)
Summary: rx refill  Clinical Lists Changes  Medications: Changed medication from SPIRONOLACTONE 25 MG  TABS (SPIRONOLACTONE) 2 TABLETS, ONCE A DAY to SPIRONOLACTONE 100 MG TABS (SPIRONOLACTONE) 1 once daily - Signed Rx of SPIRONOLACTONE 100 MG TABS (SPIRONOLACTONE) 1 once daily;  #30 x 6;  Signed;  Entered by: Chales Abrahams CMA;  Authorized by: Rachael Fee MD;  Method used: [facility]  [phone]*, [address] [place], [place]  [address], Ph: [phone] or [phone], Fax: [phone]    Prescriptions: SPIRONOLACTONE 100 MG TABS (SPIRONOLACTONE) 1 once daily  #30 x 6   Entered by:   Chales Abrahams CMA   Authorized by:   Rachael Fee MD   Signed by:   Chales Abrahams CMA on 03/08/2008   Method used:   Electronically to        [facility]  [phone]* (retail)       [address] [place], [place]  [address]       Ph: [phone] or [phone]       Fax: [phone]   RxID:   [id number]

## 2010-08-05 NOTE — Miscellaneous (Signed)
Summary: Nadolol  Clinical Lists Changes  Medications: Changed medication from NADOLOL 20 MG  TABS (NADOLOL) 1 tablet once daily to NADOLOL 20 MG  TABS (NADOLOL) 1 tablet once daily - Signed Rx of NADOLOL 20 MG  TABS (NADOLOL) 1 tablet once daily;  #90 x 6;  Signed;  Entered by: Chales Abrahams CMA;  Authorized by: Rachael Fee MD;  Method used: Electronically to [facility]*, [address] Shipping/mailing, [place], [place]  [address], Ph: [phone], Fax: [phone]    Prescriptions: NADOLOL 20 MG  TABS (NADOLOL) 1 tablet once daily  #90 x 6   Entered by:   Chales Abrahams CMA   Authorized by:   Rachael Fee MD   Signed by:   Chales Abrahams CMA on 10/29/2008   Method used:   Electronically to        [facility]* (retail)       [address]       [address] Shipping/mailing       [place], [place]  [address]       Ph: [phone]       Fax: [phone]   RxID:   [id number]

## 2010-08-05 NOTE — Procedures (Signed)
Summary: Gastroenterology EGD  Gastroenterology EGD   Imported By: Milford Cage CMA 09/09/2007 14:59:55  _____________________________________________________________________  External Attachment:    Type:   Image     Comment:   External Document

## 2010-08-05 NOTE — Miscellaneous (Signed)
Summary: Lasix refill  Clinical Lists Changes  Medications: Changed medication from FUROSEMIDE 80 MG TABS (FUROSEMIDE) Take 1 tablet by mouth BID to FUROSEMIDE 80 MG TABS (FUROSEMIDE) Take 1 tablet by mouth once daily - Signed Rx of FUROSEMIDE 80 MG TABS (FUROSEMIDE) Take 1 tablet by mouth once daily;  #90 x 1;  Signed;  Entered by: Chales Abrahams CMA;  Authorized by: Rachael Fee MD;  Method used: Electronically to [facility]*, [address], [address] Shipping/mailing, [place], [place]  [address], Ph: [phone], Fax: [phone]    Prescriptions: FUROSEMIDE 80 MG TABS (FUROSEMIDE) Take 1 tablet by mouth once daily  #90 x 1   Entered by:   Chales Abrahams CMA   Authorized by:   Rachael Fee MD   Signed by:   Chales Abrahams CMA on 05/14/2008   Method used:   Electronically to        [facility]* (retail)       [address]       [address] Shipping/mailing       [place], [place]  [address]       Ph: [phone]       Fax: [phone]   RxID:   [id number]

## 2010-08-05 NOTE — Letter (Signed)
Summary: [facility]  [facility]   Imported By: Sherian Rein 05/19/2010 10:15:18  _____________________________________________________________________  External Attachment:    Type:   Image     Comment:   External Document

## 2010-08-05 NOTE — Progress Notes (Signed)
Summary: Flurosemide refill  Phone Note Call from Patient Call back at Home Phone [phone]   Caller: Patient Call For: Brinkley Peet Reason for Call: Refill Medication Details for Reason: Flurosemide refill Summary of Call: before 2:30pm today: [phone] after 2:30pm today: [phone] pt out of Flurosemide 40mg  and would like more... [facility] Initial call taken by: Vallarie Mare,  June 20, 2008 12:24 PM    New/Updated Medications: FUROSEMIDE 40 MG TABS (FUROSEMIDE) 1 by mouth once daily   Prescriptions: FUROSEMIDE 40 MG TABS (FUROSEMIDE) 1 by mouth once daily  #90 x 1   Entered by:   Chales Abrahams CMA   Authorized by:   Rachael Fee MD   Signed by:   Chales Abrahams CMA on 06/20/2008   Method used:   Electronically to        [facility]* (retail)       [address]       [address] Shipping/mailing       [place], [place]  [address]       Ph: [phone]       Fax: [phone]   RxID:   [id number] FUROSEMIDE 40 MG TABS (FUROSEMIDE) 1 by mouth once daily  #90 x 3   Entered by:   Chales Abrahams CMA   Authorized by:   Rachael Fee MD   Signed by:   Chales Abrahams CMA on 06/20/2008   Method used:   [facility]ronically to        [facility]  [phone]* (retail)       [address] [place], [place]  [address]       Ph: [phone] or [phone]       Fax: [phone]   RxID:   [id number]  wrong pharmacy info resent to [facility]

## 2010-08-05 NOTE — Letter (Signed)
Summary: EGD Instructions  [facility]  [address] [place], [place] [address]   Phone: [phone]  Fax: [phone]       [name] [name]    July 21, 1955    MRN: [id number]       Procedure Day /Date:03/27/10 ONGEX     Arrival Time: 630 am      Procedure Time:730 am     Location of Procedure:                     [facility] ( Outpatient Registration)   PREPARATION FOR ENDOSCOPY   On 03/27/10  THE DAY OF THE PROCEDURE:  1.   Nothing after midnight to eat or drink.                                                                                                  MEDICATION INSTRUCTIONS  Unless otherwise instructed, you should take regular prescription medications with a small sip of water as early as possible the morning of your procedure.               OTHER INSTRUCTIONS  You will need a responsible adult at least 55 years of age to accompany you and drive you home.   This person must remain in the waiting room during your procedure.  Wear loose fitting clothing that is easily removed.  Leave jewelry and other valuables at home.  However, you may wish to bring a book to read or an iPod/MP3 player to listen to music as you wait for your procedure to start.  Remove all body piercing jewelry and leave at home.  Total time from sign-in until discharge is approximately 2-3 hours.  You should go home directly after your procedure and rest.  You can resume normal activities the day after your procedure.  The day of your procedure you should not:   Drive   Make legal decisions   Operate machinery   Drink alcohol   Return to work  You will receive specific instructions about eating, activities and medications before you leave.    The above instructions have been reviewed and explained to me by   Chales Abrahams CMA Duncan Dull)  January 31, 2010 11:29 AM     I fully understand and can verbalize these instructions over the phone and mailed to  home Date 01/31/10

## 2010-08-05 NOTE — Progress Notes (Signed)
Summary: Does she need labs  Phone Note Call from Patient Call back at [phone]   Call For: Dr Christella Hartigan Reason for Call: Talk to Nurse Summary of Call: Wonders if she needs to do labs before her rov 04-15-2009. Initial call taken by: Leanor [facility],  April 10, 2009 10:13 AM  Follow-up for Phone Call        no answer Chales Abrahams CMA (AAMA)  April 10, 2009 10:50 AM  pt aware to have labs this week Follow-up by: Chales Abrahams CMA (AAMA),  April 10, 2009 11:02 AM

## 2010-08-05 NOTE — Letter (Signed)
Summary: [facility]   Imported By: Lester [place] 09/17/2009 08:39:00  _____________________________________________________________________  External Attachment:    Type:   Image     Comment:   External Document

## 2010-08-05 NOTE — Progress Notes (Signed)
Summary: Triage  Phone Note Call from Patient Call back at [phone]   Caller: Patient Call For: Dr. Christella Hartigan Summary of Call: [facility] wants to order Ultrasound of Liver and they do not have the ability to do that at [facility]. She wants to know if Dr. Christella Hartigan can order the ultrasound of the liver Dx 571.2 Initial call taken by: Karna Christmas,  May 01, 2010 8:35 AM  Follow-up for Phone Call        Dr Christella Hartigan is this ok to order? Chales Abrahams CMA Duncan Dull)  May 01, 2010 8:48 AM   Additional Follow-up for Phone Call Additional follow up Details #1::        yes Additional Follow-up by: Rachael Fee MD,  May 01, 2010 8:59 AM    Additional Follow-up for Phone Call Additional follow up Details #2::    Dr Hamilton Capri Rolm Baptise)  would like a copy.  Appt scheduled for 05/05/10  8 am pt aware. Follow-up by: Chales Abrahams CMA Duncan Dull),  May 01, 2010 9:08 AM

## 2010-08-05 NOTE — Procedures (Signed)
Summary: Recall Assessment/[facility]  Recall Assessment/[facility]   Imported By: Sherian Rein 02/05/2010 10:53:06  _____________________________________________________________________  External Attachment:    Type:   Image     Comment:   External Document

## 2010-08-05 NOTE — Assessment & Plan Note (Signed)
GI PROBLEM LIST:   1. Alcoholic cirrhosis, extensive, long history of drinking, presented winter, 2008 with jaundice and ascites. Hospitalized April, 2008 for several days.  Her most recent imaging was an ultrasound April, 2007 showing an echogenic nodular liver consistent with cirrhosis, no obvious masses.  Ascites.  In June 2008, difficult to control electrolytes and ascites. Renal consultation with Dr. Arrie Aran. The patient is currently on Lasix 80 mg t.i.d. and very low dose aldactone 25 mg once daily with poor control of ascites.  Early July 2008, large volume paracentesis with 9 liters of ascites removed (with albumin infusion).  Chronic liver disease workup hepatitis A antibody positive (old exposure). Hepatitis B negative, hepatitis C negative;  alpha 1 antitrypsin ceruloplasmin, ANA, AMA, antismooth muscle antibody all normal. Iron tests essentially normal.  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  Started on nadolol 20 mg daily.  Summer, 2009 modifying her diuretic regimen to a more Aldactone based regimen.  Most recent EGD: September 2010, no esophageal varices seen; no portal gastropathy. Most recent local liver imaging: MRI September 2009, cirrhosis without any signs of discrete liver lesions. Most recent local AFP: September 2009, 11.3 (slightly above normal) Was undergoing pre-transplant evaluation at [facility] early 2010, however her insurance carrier pulled out of [facility]. Currently at [facility], Dr. Hamilton Capri, for pre-transplant workup (they will be doing HCC screening at [facility], imaging and AFPS.   History of Present Illness Visit Type: Follow-up Visit Primary GI MD: Rob Bunting MD Primary Provider: Herb Grays, M.D. Requesting Provider: n/a Chief Complaint: Follow up History of Present Illness:      very pleasant 55 year old woman whom I saw about 4 months.  Good holidays.  She is feeling  well. still goes to personal trainer, running again.   Saw Dr. Briant Sites earlier this week and consulted with transplant surgeon.  Current MELD based on this weeks labs from [facility] ws 9-10.           Current Medications (verified): 1)  Furosemide 40 Mg Tabs (Furosemide) .Marland Kitchen.. 1 By Mouth Once Daily 2)  Spironolactone 100 Mg Tabs (Spironolactone) .... Take 1/2 Tablet By Mouth Once Daily 3)  Nadolol 20 Mg  Tabs (Nadolol) .Marland Kitchen.. 1 Tablet Once Daily 4)  B Complex  Tabs (B Complex Vitamins) .... Take One By Mouth Once Daily 5)  Citracal/vitamin D 250-200 Mg-Unit  Tabs (Calcium Citrate-Vitamin D) .Marland Kitchen.. 1 Tablet Once Daily 6)  Advair Diskus 100-50 Mcg/dose Misc (Fluticasone-Salmeterol) .... Use Every Am 7)  Pre-Natal Formula  Tabs (Prenatal Multivit-Min-Fe-Fa) .... Take One By Mouth Once Daily  Allergies (verified): 1)  Bactrim (Sulfamethoxazole-Trimethoprim) 2)  Lisinopril (Lisinopril) 3)  Prinivil (Lisinopril)  Vital Signs:  Patient profile:   55 year old female Height:      64 inches Weight:      153.6 pounds Pulse rate:   68 / minute Pulse rhythm:   regular BP sitting:   118 / 72  (left arm) Cuff size:   regular  Vitals Entered By: Harlow Mares CMA Duncan Dull) (August 23, 2009 3:59 PM)  Physical Exam  Additional Exam:  Constitutional: generally well appearing Psychiatric: alert and oriented times 3 Abdomen: soft, non-tender, non-distended, normal bowel sounds, no ascites Extremities no lower extremity edema   Impression & Recommendations:  Problem # 1:  ALCOHOLIC CIRRHOSIS her meld score is between 9 and 10 currently. She is on fairly minimal diuretics without any edema problems. She will return to see me in  6 months and sooner if needed.  Patient Instructions: 1)  Continue on spironalactone 50mg  a day (new script written). 2)  Continue on lasix 40mg  a day. 3)  Return to see Dr. Christella Hartigan in 6 months. 4)  The medication list was reviewed and reconciled.  All changed / newly  prescribed medications were explained.  A complete medication list was provided to the patient / caregiver. Prescriptions: SPIRONOLACTONE 50 MG  TABS (SPIRONOLACTONE) take one pill a day  #30 x 11   Entered and Authorized by:   Rachael Fee MD   Signed by:   Rachael Fee MD on 08/23/2009   Method used:   Electronically to        [facility]* (retail)       [address]       [address] Shipping/mailing       [place], [place]  [address]       Ph: [phone]       Fax: [phone]   RxID:   [id number]

## 2010-08-05 NOTE — Progress Notes (Signed)
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

## 2010-08-05 NOTE — Progress Notes (Signed)
Summary: need labs put in  Phone Note Call from Patient Call back at Home Phone [phone]   Caller: Patient Call For: Jordon Bourquin Reason for Call: Talk to Nurse Summary of Call: pt want to know if we can put labs in b4 her app on 9-2 states that she always gets labs b4 seeing dr  Initial call taken by: Tawni Levy,  March 01, 2008 2:50 PM  Follow-up for Phone Call        labs entered in idx  .sign  Teryl Lucy RN  March 01, 2008 2:59 PM

## 2010-08-05 NOTE — Progress Notes (Signed)
Summary: Pt needs labs  Phone Note Outgoing Call   Call placed by: Chales Abrahams CMA,  January 11, 2009 8:42 AM Summary of Call: left message on machine to call back pt needs lab results Chales Abrahams CMA  January 11, 2009 8:42 AM  Follow-up for Phone Call        pt aware Follow-up by: Chales Abrahams CMA,  January 11, 2009 9:29 AM

## 2010-08-05 NOTE — Miscellaneous (Signed)
Summary: Spironolactone refill  Clinical Lists Changes  Medications: Changed medication from SPIRONOLACTONE 100 MG TABS (SPIRONOLACTONE) 1 once daily to SPIRONOLACTONE 100 MG TABS (SPIRONOLACTONE) 1  tab by mouth once daily - Signed Rx of SPIRONOLACTONE 100 MG TABS (SPIRONOLACTONE) 1  tab by mouth once daily;  #90 x 3;  Signed;  Entered by: Chales Abrahams CMA;  Authorized by: Rachael Fee MD;  Method used: Electronically to [facility]*, [address], [address] Shipping/mailing, [place], [place]  [address], Ph: [phone], Fax: [phone]    Prescriptions: SPIRONOLACTONE 100 MG TABS (SPIRONOLACTONE) 1  tab by mouth once daily  #90 x 3   Entered by:   Chales Abrahams CMA   Authorized by:   Rachael Fee MD   Signed by:   Chales Abrahams CMA on 10/01/2008   Method used:   Electronically to        [facility]* (retail)       [address]       [address] Shipping/mailing       [place], [place]  [address]       Ph: [phone]       Fax: [phone]   RxID:   [id number]

## 2010-08-05 NOTE — Procedures (Signed)
Summary: Upper Endoscopy  Patient: [name] Note: All result statuses are Final unless otherwise noted.  Tests: (1) Upper Endoscopy (EGD)   EGD Upper Endoscopy       DONE     [facility]     [address] [place], [place]  [address]           ENDOSCOPY PROCEDURE REPORT           PATIENT:  [name] [name], [name] [name]  MR#:  [id number]     BIRTHDATE:  09-15-1955, 54 yrs. old  GENDER:  female     ENDOSCOPIST:  Rachael Fee, MD     PROCEDURE DATE:  03/27/2010     PROCEDURE:  EGD, diagnostic     ASA CLASS:  Class III     INDICATIONS:  variceal surveillance (Grade II prsesent in 2008),     last EGD 03/2009 no varices or gastropathy     MEDICATIONS:  Fentanyl 75 mcg IV, Versed 5 mg IV     TOPICAL ANESTHETIC:  Cetacaine Spray           DESCRIPTION OF PROCEDURE:   After the risks benefits and     alternatives of the procedure were thoroughly explained, informed     consent was obtained.  The  endoscope was introduced through the     mouth and advanced to the second portion of the duodenum, without     limitations.  The instrument was slowly withdrawn as the mucosa     was fully examined.     <<PROCEDUREIMAGES>>     The upper, middle, and distal third of the esophagus were     carefully inspected and no abnormalities were noted. The z-line     was well seen at the GEJ. The endoscope was pushed into the fundus     which was normal including a retroflexed view. The antrum,gastric     body, first and second part of the duodenum were unremarkable (see     image2, image3, image4, image5, and image6).    Retroflexed views     revealed no abnormalities.    The scope was then withdrawn from     the patient and the procedure completed.     COMPLICATIONS:  None           ENDOSCOPIC IMPRESSION:     1) Normal EGD; no varices or portal gastropathy           RECOMMENDATIONS:     Will forward these results to [facility], Dr. Briant Sites.     Will repeat EGD in 18 months.     Continue the good  work!           ______________________________     Rachael Fee, MD           n.     eSIGNED:   Rachael Fee at 03/27/2010 07:43 AM           Ernst Spell, [id number]  Note: An exclamation mark (!) indicates a result that was not dispersed into the flowsheet. Document Creation Date: 03/27/2010 7:44 AM _______________________________________________________________________  (1) Order result status: Final Collection or observation date-time: 03/27/2010 07:37 Requested date-time:  Receipt date-time:  Reported date-time:  Referring Physician:   Ordering Physician: Rob Bunting [phone]) Specimen Source:  Source: Launa Grill Order Number: [id number] Lab site:   Appended Document: Upper Endoscopy cheryl, can you fax to [facility] Dr. Briant Sites, he is not in biscom  also needs recall EGD in 18 months  Appended Document: Upper Endoscopy    Clinical Lists Changes  Observations: Added new observation of EGD DUE: 09/2011 (03/31/2010 10:42)      Appended Document: Upper Endoscopy recall in IDX and EMR  Appended Document: Upper Endoscopy sent to transcription

## 2010-08-05 NOTE — Progress Notes (Signed)
Summary: RX  Phone Note Call from Patient Call back at Home Phone [phone]   Caller: Patient Call For: [name] Reason for Call: Talk to Nurse Details for Reason: RX Summary of Call: KLOROCON was cut down to half per Dr Christella Hartigan.[name] [name] is out of meds can a [facility]  [address]  [phone]* and make sure that correct mg are called in since it was cut down. Initial call taken by: Guadlupe [place],  March 19, 2008 9:19 AM      Prescriptions: KLOR-CON M20 20 MEQ TBCR (POTASSIUM CHLORIDE CRYS CR) Take 1/2  tablet by mouth once a day  #30 x 3   Entered by:   Chales Abrahams CMA   Authorized by:   Rachael Fee MD   Signed by:   Chales Abrahams CMA on 03/19/2008   Method used:   [facility]ron[facility] to        [facility]  [phone]* (retail)       [address] [place], [place]  [address]       Ph: [phone] or [phone]       Fax: [phone]   RxID:   [id number]

## 2010-08-13 ENCOUNTER — Encounter: Payer: Self-pay | Admitting: Gastroenterology

## 2010-08-15 ENCOUNTER — Other Ambulatory Visit: Payer: Self-pay | Admitting: Obstetrics and Gynecology

## 2010-08-15 DIAGNOSIS — Z1231 Encounter for screening mammogram for malignant neoplasm of breast: Secondary | ICD-10-CM

## 2010-08-27 ENCOUNTER — Encounter: Payer: Self-pay | Admitting: Gastroenterology

## 2010-08-27 NOTE — Letter (Signed)
Summary: Heloise Purpura MD/[facility]  Heloise Purpura MD/[facility]   Imported By: Lester [place] 08/19/2010 08:52:59  _____________________________________________________________________  External Attachment:    Type:   Image     Comment:   External Document

## 2010-09-09 ENCOUNTER — Other Ambulatory Visit: Payer: Self-pay | Admitting: Urology

## 2010-09-09 ENCOUNTER — Encounter (HOSPITAL_COMMUNITY): Payer: 59

## 2010-09-09 ENCOUNTER — Encounter: Payer: Self-pay | Admitting: Gastroenterology

## 2010-09-09 ENCOUNTER — Ambulatory Visit (HOSPITAL_COMMUNITY)
Admission: RE | Admit: 2010-09-09 | Discharge: 2010-09-09 | Disposition: A | Discharge status: Home / Self Care | Payer: 59 | Source: Ambulatory Visit | Attending: Urology | Admitting: Urology

## 2010-09-09 ENCOUNTER — Other Ambulatory Visit (HOSPITAL_COMMUNITY): Payer: Self-pay | Admitting: Urology

## 2010-09-09 DIAGNOSIS — D494 Neoplasm of unspecified behavior of bladder: Secondary | ICD-10-CM

## 2010-09-09 DIAGNOSIS — J45909 Unspecified asthma, uncomplicated: Secondary | ICD-10-CM | POA: Insufficient documentation

## 2010-09-09 DIAGNOSIS — I498 Other specified cardiac arrhythmias: Secondary | ICD-10-CM | POA: Insufficient documentation

## 2010-09-09 DIAGNOSIS — Z01818 Encounter for other preprocedural examination: Secondary | ICD-10-CM | POA: Insufficient documentation

## 2010-09-09 DIAGNOSIS — Z01812 Encounter for preprocedural laboratory examination: Secondary | ICD-10-CM | POA: Insufficient documentation

## 2010-09-09 LAB — CBC
MCV: 98.4 fL (ref 78.0–100.0)
Platelets: 220 10*3/uL (ref 150–400)
RDW: 13.1 % (ref 11.5–15.5)
WBC: 9 10*3/uL (ref 4.0–10.5)

## 2010-09-09 LAB — COMPREHENSIVE METABOLIC PANEL
Albumin: 4.2 g/dL (ref 3.5–5.2)
Alkaline Phosphatase: 92 U/L (ref 39–117)
BUN: 13 mg/dL (ref 6–23)
GFR calc Af Amer: >60 mL/min (ref >60)
Potassium: 4.1 mEq/L (ref 3.5–5.1)
Sodium: 140 mEq/L (ref 135–145)
Total Protein: 7.7 g/dL (ref 6.0–8.3)

## 2010-09-09 LAB — SURGICAL PCR SCREEN: MRSA, PCR: NEGATIVE

## 2010-09-11 ENCOUNTER — Ambulatory Visit
Admission: RE | Admit: 2010-09-11 | Discharge: 2010-09-11 | Disposition: A | Discharge status: Home / Self Care | Payer: 59 | Source: Ambulatory Visit | Attending: Obstetrics and Gynecology | Admitting: Obstetrics and Gynecology

## 2010-09-11 DIAGNOSIS — Z1231 Encounter for screening mammogram for malignant neoplasm of breast: Secondary | ICD-10-CM

## 2010-09-11 NOTE — Letter (Signed)
Summary: [facility]  [facility]   Imported By: Sherian Rein 09/04/2010 11:50:15  _____________________________________________________________________  External Attachment:    Type:   Image     Comment:   External Document

## 2010-09-15 ENCOUNTER — Other Ambulatory Visit: Payer: Self-pay | Admitting: Urology

## 2010-09-15 ENCOUNTER — Ambulatory Visit (HOSPITAL_COMMUNITY)
Admission: RE | Admit: 2010-09-15 | Discharge: 2010-09-15 | Disposition: A | Discharge status: Home / Self Care | Payer: 59 | Source: Ambulatory Visit | Attending: Urology | Admitting: Urology

## 2010-09-15 DIAGNOSIS — J45909 Unspecified asthma, uncomplicated: Secondary | ICD-10-CM | POA: Insufficient documentation

## 2010-09-15 DIAGNOSIS — K7460 Unspecified cirrhosis of liver: Secondary | ICD-10-CM | POA: Insufficient documentation

## 2010-09-15 DIAGNOSIS — C672 Malignant neoplasm of lateral wall of bladder: Secondary | ICD-10-CM | POA: Insufficient documentation

## 2010-09-15 DIAGNOSIS — I10 Essential (primary) hypertension: Secondary | ICD-10-CM | POA: Insufficient documentation

## 2010-09-21 NOTE — Op Note (Signed)
[name], [name]             ACCOUNT NO.:  [id number]  MEDICAL RECORD NO.:  [id number]           PATIENT TYPE:  O  LOCATION:  DAYL                         FACILITY:  [facility]  PHYSICIAN:  Heloise Purpura, MD      DATE OF BIRTH:  28-Apr-1956  DATE OF PROCEDURE:  09/15/2010 DATE OF DISCHARGE:                              OPERATIVE REPORT   PREOPERATIVE DIAGNOSIS:  Bladder tumor.  POSTOPERATIVE DIAGNOSIS:  Bladder tumor.  PROCEDURE: 1. Cystoscopy. 2. Transurethral resection of bladder tumor (3 cm). 3. Pelvic exam under anesthesia.  SURGEON:  Heloise Purpura, MD  ANESTHESIA:  General.  COMPLICATIONS:  None.  ESTIMATED BLOOD LOSS:  Minimal.  INDICATION:  [name] is a 55 year old female who recently presented with gross hematuria and underwent an evaluation including a CT scan which revealed no evidence of upper tract malignancy.  However, examination of the bladder demonstrated a 3-cm papillary left-sided bladder tumor.  I therefore recommended proceeding with transurethral resection of her bladder tumor.  The potential risks, complications, and alternative options associated with the above procedure were discussed in detail and informed consent was obtained.  DESCRIPTION OF PROCEDURE:  The patient was taken to the operating room and a general anesthetic was administered.  She was given preoperative antibiotics, placed in the dorsal lithotomy position, and prepped and draped in the usual sterile fashion.  Next, preoperative time-out was performed.  Cystourethroscopy was then performed with the 12 and 70 degrees lenses.  This revealed the aforementioned 3 cm papillary bladder tumor on the left side of the bladder just beyond the left ureteral orifice but not involving the left ureteral orifice.  No other bladder tumors, stones, or other mucosal pathology was identified.  The ureteral orifices were both in their normal anatomic position effluxing clear urine.  The  cystoscope was then removed and replaced with a 28-French resectoscope sheath.  Using loupe cutting resection, the patient's bladder tumor was resected in its entirety down to the base with cuts down into the muscularis propria.  This bladder tumor was then removed for permanent pathologic analysis.  The left ureteral orifice did not appear to be involved with the resection.  No residual tumor was identified at the resection site, but there was noted to be evidence of perivesical fat.  For this reason, it was decided not to proceed with postoperative mitomycin C instillation at the end of the procedure.  A 0.038 sensor guidewire was advanced up the left ureter to confirm patency of the ureter.  Again, there was no evidence to suggest involvement of the ureteral orifice or distal ureter with the resection. Loop cautery was used to provide hemostasis and the bladder was emptied and reinspected.  There did appear to be excellent hemostasis at this time and the procedure was ended.  The bladder was emptied.  A pelvic exam was then performed with no evidence of any pelvic masses noted.  The patient tolerated the procedure well without complications.  She was able to be awakened and transferred to the recovery unit in satisfactory condition.     Heloise Purpura, MD     LB/MEDQ  D:  09/15/2010  T:  09/15/2010  Job:  [id number]  Electronically Signed by Heloise Purpura MD on 09/21/2010 04:41:50 PM

## 2010-10-09 LAB — POCT URINALYSIS DIP (DEVICE)
Nitrite: POSITIVE — AB
Protein, ur: >=300 mg/dL — AB
Urobilinogen, UA: 1 mg/dL (ref 0.0–1.0)
pH: 7 (ref 5.0–8.0)

## 2010-11-12 ENCOUNTER — Other Ambulatory Visit: Payer: Self-pay

## 2010-11-12 ENCOUNTER — Other Ambulatory Visit (HOSPITAL_COMMUNITY): Payer: Self-pay | Admitting: *Deleted

## 2010-11-12 DIAGNOSIS — K7030 Alcoholic cirrhosis of liver without ascites: Secondary | ICD-10-CM

## 2010-11-18 NOTE — Assessment & Plan Note (Signed)
[facility]                         GASTROENTEROLOGY OFFICE NOTE   NAME:[name] [name], [name] [name]                    MRN:          [id number]  DATE:04/22/2007                            DOB:          Nov 09, 1955    PRIMARY CARE PHYSICIAN:  Dr. Herb Grays.   NEPHROLOGIST:  Dr. Arrie Aran.   LIVER TRANSPLANT HEPATOLOGIST:  Dr. Jason Coop at [facility].   GI PROBLEM LIST:  Alcoholic cirrhosis, extensive, long history of  drinking, presented winter, 2008 with jaundice and ascites.  Hospitalized April, 2008 for several days.  Her most recent imaging was  an ultrasound April, 2007 showing an echogenic nodular liver consistent  with cirrhosis, no obvious masses.  Ascites.  In June 2008, difficult to  control electrolytes and ascites. Renal consultation with Dr.  Arrie Aran. The patient is currently on Lasix 80 mg t.i.d. and very low  dose aldactone 25 mg once daily with poor control of ascites.  Early  July 2008, large volume paracentesis with 9 liters of ascites removed  (with albumin infusion).  Chronic liver disease workup hepatitis A  antibody positive (old exposure). Hepatitis B negative, hepatitis C  negative;  alpha 1 antitrypsin ceruloplasmin, ANA, AMA, antismooth  muscle antibody all normal. Iron tests essentially normal.  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  Started on nadolol 20 mg  daily.   INTERVAL HISTORY:  I last saw Tyese 1 month ago.  She has been doing  very well since then.  Her fluid overload is very well controlled on an  unusual combination of diuretics (80 of Lasix t.i.d. and 25 mg of  spironolactone once daily).  This keeps her edema under good control,  and keeps her sodium from dropping out like it did when she was on  higher doses of aldactone.  She takes 20 mEq of potassium every day.  She saw Dr. Jason Coop earlier this week for initial liver  transplant  evaluation.  She is rescheduled to see him again in 4 months.  I  performed esophagogastroduodenoscopy earlier this month summarized  above.  She was started on nadolol with good response.   CURRENT MEDICINES:  1. Advair.  2. B12.  3. Citracal.  4. Lasix 80 mg t.i.d.  5. Spironolactone 25 mg once daily.  6. Proventil.  7. Klor-Con.  8. Prenatal vitamins.  9. Nadolol 20 mg once daily.   PHYSICAL EXAMINATION:  Weight 117 pounds which is up 2 pounds since her  last visit.  Blood pressure 102/62, pulse 68.  CONSTITUTIONAL:  Generally well-appearing.  ABDOMEN:  Soft, nontender, nondistended, normal bowel sounds.  No  obvious ascites.  EXTREMITIES:  Trace edema bilaterally.   ASSESSMENT AND PLAN:  A 55 year old woman with alcoholic cirrhosis  complicated by edema, under good control on current diuretics.  Shia is  doing very well.  She had labs drawn 1 week ago with Dr. Arrie Aran and  that showed a potassium of 4.2 and creatinine of 0.8.  Her liver tests  have essentially stabilized with a bilirubin the mid 2s.  I see  no  reason to adjust her diuretics at this point.  It is admittedly an  unusual ratio, but she had sodiums that were as low as 120, and slightly  lower on higher doses of spironolactone, and so her current ratio is  very electrolyte neutral, and also is controlling her edema very well.  She will follow up with me in 2 months.  She will follow up with Dr.  Julieta Gutting in 4 months.  We will get her arranged to begin a formal alcohol  cessation program.     Rachael Fee, MD  Electronically Signed    DPJ/MedQ  DD: 04/22/2007  DT: 04/23/2007  Job #: [id number]   cc:   Tammy R. Collins Scotland, M.D.  Terrial Rhodes, M.D.  Jason Coop, MD

## 2010-11-18 NOTE — Assessment & Plan Note (Signed)
[facility]                         GASTROENTEROLOGY OFFICE NOTE   [name], [name] CROSLIN                    MRN:          [id number]  DATE:02/04/2007                            DOB:          [birth year]    PRIMARY CARE PHYSICIAN:  Tammy R. Collins Scotland, M.D.   GI PROBLEM LIST:  Alcoholic cirrhosis, extensive, long history of  drinking, presented winter, 2008 with jaundice and ascites.  Hospitalized April, 2008 for several days.  Her most recent imaging was  an ultrasound April, 2007 showing an echogenic nodular liver consistent  with cirrhosis, no obvious masses.  Ascites.  In June 2008, difficult to  control electrolytes and ascites. Renal consultation with Dr.  Arrie Aran. The patient is currently on Lasix 80 mg t.i.d. and very low  dose aldactone 25 mg once daily with poor control of ascites.  Early  July 2008, large volume paracentesis with 9 liters of ascites removed  (with albumin infusion).  Chronic liver disease workup hepatitis A  antibody positive (old exposure). Hepatitis B negative, hepatitis C  negative;  alpha 1 antitrypsin ceruloplasmin, ANA, AMA, antismooth  muscle antibody all normal. Iron tests essentially normal.   INTERVAL HISTORY:  I last saw [name] [name] one month ago. Since then, she  underwent a 9 liter large volume paracentesis with albumin infusion. She  has done extremely well since then. She actually continued to lose about  a pound a day following the paracentesis and is down 30 pounds now since  her last visit. She continues on Lasix 80 mg t.i.d. and aldactone 25 mg  once daily with 15 mEq of potassium (just recently increased). She had  labs done yesterday showing normal electrolytes, normal creatinine. She  feels much better. She is not still losing weight. She actually  stabilized about a week ago and may have put on 1-2 pounds since then.   CURRENT MEDICATIONS:  1. Advair.  2. B12.  3. Citracal.  4. Lasix 80 mg t.i.d.  5.  Spironolactone 25 mg once daily.  6. Proventil.  7. Klor-Con 15 mEq once daily.   PHYSICAL EXAMINATION:  Weight 109 pounds, which is down 30 pounds from  her last visit. Blood pressure 118/78, pulse 104.  CONSTITUTIONAL: Mildly jaundiced.  NEUROLOGIC: Alert and oriented x3. No tremor.  ABDOMEN: Flat to only mild ascites.  EXTREMITIES: Trace edema bilaterally in her ankles.   ASSESSMENT/PLAN:  A 55 year old woman with advanced alcoholic cirrhosis  complicated by edema that seems to be under good control on current  diuretics.   She had 9 liters taken off and then continued to lose weight. She did  not contact me. I would have been a bit nervous about her creatinine if  she had contacted [place] about her continued weight loss, but she seems to  have stabilized and after 30 pounds down and her electrolytes are  remarkably very normal. She will continue on Lasix 80 mg three times a  day, spironolactone at this small dose of 25 mg once daily and keep her  Klor-Con at the same dose of 15 and she will get a BMET checked next  week  to see if we need to adjust her potassium dose. She will return to  see me in one month. We will begin her on hepatitis B immunization  series. I will dictate a note to her boss saying she looks much better  and is probably fit to begin slowly back into work. Not yet performed  EGD to screen her for varices, we will discuss that at her next visit in  one months.     Rachael Fee, MD  Electronically Signed    DPJ/MedQ  DD: 02/04/2007  DT: 02/04/2007  Job #: [id number]   cc:   Terrial Rhodes, M.D.  Tammy R. Collins Scotland, M.D.

## 2010-11-18 NOTE — Assessment & Plan Note (Signed)
[facility]                         GASTROENTEROLOGY OFFICE NOTE   [name], [name] [name] GAUTNEY                    MRN:          [id number]  DATE:09/20/2007                            DOB:          [birth year]    PRIMARY CARE PHYSICIAN:  Tammy R. Collins Scotland, M.D.   LIVER TRANSPLANT HEPATOLOGIST:  Jason Coop, M.D., [facility].   GI PROBLEM LIST:  Alcoholic cirrhosis, extensive, long history of  drinking, presented winter, 2008 with jaundice and ascites.  Hospitalized April, 2008 for several days.  Her most recent imaging was  an ultrasound April, 2007 showing an echogenic nodular liver consistent  with cirrhosis, no obvious masses.  Ascites.  In June 2008, difficult to  control electrolytes and ascites. Renal consultation with Dr.  Arrie Aran. The patient is currently on Lasix 80 mg t.i.d. and very low  dose aldactone 25 mg once daily with poor control of ascites.  Early  July 2008, large volume paracentesis with 9 liters of ascites removed  (with albumin infusion).  Chronic liver disease workup hepatitis A  antibody positive (old exposure). Hepatitis B negative, hepatitis C  negative;  alpha 1 antitrypsin ceruloplasmin, ANA, AMA, antismooth  muscle antibody all normal. Iron tests essentially normal.  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  Started on nadolol 20 mg  daily.   INTERVAL HISTORY:  I last saw [name] [name] [name] about three months ago.  Since then  she has done very well.  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  She was  seen at [facility] about two weeks ago and they are very  pleased with how she has done.  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  She had an AFP also that was slightly above normal at 11.   CURRENT  MEDICATIONS:  1. Advair.  2. Prenatal vitamin.  3. Proventil inhaler.  4. Ativan.  5. Citrucel.  6. Lasix 80 mg t.i.d.  7. Spironolactone 25 mg once daily.  8. Nadolol 20 mg once daily.   PHYSICAL EXAMINATION:  VITAL SIGNS:  Weight 124 pounds.  This is up six  pounds since her last visit.  Blood pressure 104/78, pulse 72.  GENERAL:  Generally well-appearing.  HEENT:  Anicteric sclerae.  LUNGS:  Clear to auscultation bilaterally.  ABDOMEN:  Soft, nontender, nondistended.  No obvious ascites.  EXTREMITIES:  No lower extremity edema.   ASSESSMENT/PLAN:  A 55 year old woman with alcoholic cirrhosis  complicated by edema and portal hypertension.   [name] is doing very well.  She has a very unusual diuretic regimen but  this seems to work for her (Lasix 80 mg t.i.d., aldactone 25 mg once  daily).  Prior to this she had very hard to control potassium and  electrolyte levels, but under the care of Dr. Arrie Aran from nephrology  we have got her to this regimen and she has been on this for several  months without any problems with her electrolytes.  As long as she stays  on her potassium daily her kidney function has stayed normal.  I will  arrange for her just to have a BMET check today.  She will return to see  me in three months.  Just prior to that she will get a full set of labs  to include complete metabolic profile, CBC, and coags.  I will  communicate the results with her liver transplant center, who plan to  see her every six months for now.  Her MELD score is low, must be less  than 10, although I do not have labs in the recent 2-3 months to  calculate it completely.  Needless to say she has been abstinent for  almost a year and doing fantastically.     Rachael Fee, MD  Electronically Signed    DPJ/MedQ  DD: 09/20/2007  DT: 09/21/2007  Job #: [id number]   cc:   Onalee Hua L. Dellia Cloud, PhD  Tammy R. Collins Scotland, M.D.  Jason Coop, MD

## 2010-11-18 NOTE — Assessment & Plan Note (Signed)
[facility]                         GASTROENTEROLOGY OFFICE NOTE   [name], [name]                    MRN:          [id number]  DATE:01/10/2007                            DOB:          1956-03-06    PRIMARY CARE PHYSICIAN:  [name] [name], M.D.   GI PROBLEM LIST:  Alcoholic cirrhosis, extensive, long history of  drinking, presented winter, 2008 with jaundice and ascites.  Hospitalized April, 2008 for several days.  Her most recent imaging was  an ultrasound April, 2007 showing an echogenic nodular liver consistent  with cirrhosis, no obvious masses.  Ascites.  In June 2008, difficult to  control electrolytes and ascites. Renal consultation with Dr.  Arrie [name]. The patient is currently on Lasix 80 mg t.i.d. and very low  dose aldactone 25 mg once daily with poor control of ascites.   INTERVAL HISTORY:  I last saw [name] [name] 2-3 weeks ago. Since then, her Lasix  has been greatly increased and her aldactone initially stopped, but  after a visitation with Dr. Arrie [name] it was restarted at a very low  dose. Her sodium has responded very well. Lab tests done July 3 showed a  sodium of 134, potassium of 2.8, normal BUN and creatinine. Dr.  Arrie [name] saw her that day and started her on spironolactone 25 mg once  daily, which she started shortly after those lab tests were done so she  has been on spironolactone now for 3-4 days. Her weight is up 6 pounds  since her last visit. She is getting increasingly uncomfortable with  abdominal girth and lower extremity edema.   CURRENT MEDICATIONS:  1. Advair.  2. B12.  3. Citracal.  4. Lasix 80 mg t.i.d.  5. Spironolactone 25 mg once daily.  6. Proventil.   PHYSICAL EXAMINATION:  Weight 139 pounds, blood pressure 112/78, pulse  92.  CONSTITUTIONAL: Mildly jaundiced, alert and oriented x3. Mild tremor,  but no asterixis.  ABDOMEN: Tense ascites.  There is 2-3+ bilateral edema in her ankles and up to her  mid calves.   ASSESSMENT/PLAN:  A 55 year old woman with advanced alcoholic cirrhosis  complicated by edema and electrolyte issues.   Her potassium was 2.8 last Thursday. Since then, she has started  spironolactone and restarted spironolactone at a very low dose so it  probably is a bit higher than that now. I will get a repeat basic  metabolic profile today to see where she is at. We may have to start  potassium supplements. Her ascites and edema is not getting better and  this is complicated by her difficult electrolyte situation. I will  arrange for a large volume paracentesis to be done by Interventional  Radiology and if more than 5 liters are removed, she will get 12.5 mg of  IV albumin per liter removed. Looking back on her records, I do not see  formal labs done for hepatitis A, B or C as well as other common causes  of chronic liver disease so I will arrange for that to be done with her  blood testing today. She is about 2-1/2 months sober and she has no  thoughts about restarting drinking. Hopefully, this will continue and if  we get a couple more months behind her without drinking I will arrange  for her to have liver transplant evaluation at one of the nearby  transplant centers. We briefly discussed TIPS procedure as an  alternative way to manage edema in her situation. She did have some  encephalopathy when she first presented and she does have mild tremor. I  do not think she is encephalopathic, but I am concerned that TIPS may  put her into encephalopathy, but we may need to consider that option in  the future. She will return to see me in three weeks and sooner if  needed.     [name] Fee, MD  Electronically Signed    DPJ/MedQ  DD: 01/10/2007  DT: 01/10/2007  Job #: [id number]   cc:   [name] [name], M.D.

## 2010-11-18 NOTE — Letter (Signed)
February 04, 2007    Nancy Nordmann  Executive Vice [employer]  [address]  [place], [place]  [address]  Fax:  [phone]   RE:  [name] [name], [name] [name]  MRN:  [id number]  /  DOB:  17-Dec-1955   Dear Mr. Anderson:   I have been taking care of [name] [name] and she has been improving.  I  think it is safe for her to return to work on a limited basis.  I  advised part time and slowly increasing as she can tolerate.    Sincerely,      Rachael Fee, MD  Electronically Signed    DPJ/MedQ  DD: 02/04/2007  DT: 02/05/2007  Job #: [id number]

## 2010-11-18 NOTE — Assessment & Plan Note (Signed)
[facility]                         GASTROENTEROLOGY OFFICE NOTE   [name], [name] GRZESIAK                    MRN:          [id number]  DATE:11/10/2006                            DOB:          Sep 10, 1955    GASTROINTESTINAL PROBLEM LIST:  1. Alcoholic cirrhosis.  Extensive long history of drinking.      Presented winter 2008 with jaundice and ascites.  Hospitalized      April 2008 for several days.   MOST RECENT IMAGING:  Ultrasound April 40981 showed echogenic nodular  liver consistent with cirrhosis, no obvious masses.  Ascites.   Most recent liver tests late April 2008, total bilirubin 21, AST 81, ALT  53, INR 2.5, creatinine 1.  White count 22,000 (patient on  prednisolone), hemoglobin 11.2, platelets 173.   INTERVAL HISTORY:  I last saw [name] [name] when she was hospitalized at  [facility] for several days for complications from  alcoholic cirrhosis.  She had a 4.5 liter ascites tap.  Fluid analysis  showed noninfected ascites consistent with portal hypertension.  Her  hepatitis discriminate function was in the upper 60s, low 70s, so she  was started on prednisolone.  She was also started on Aldactone and  Lasix at 100 and 40 mg, respectively.  She has been home for 2-3 weeks.  She has 2-3 bowel movements a day.  She says she is more able to  concentrate very well. She is still fairly tremulous.  She was taking 1-  2 Ativan a day.  She has not had anything more to drink in about a month  now.   CURRENT MEDICATIONS:  1. Advair.  2. Rhinocort.  3. Allegra.  4. Albuterol.  5. Lasix 40 mg once daily.  6. Spironolactone 100 mg once daily.  7. Prenatal vitamin.  8. Prednisone 40 mg once daily.  9. Potassium chloride unclear dose once daily.  10.Proventil inhaler.  11.Ativan 1 mg 2-3 times daily.   PHYSICAL EXAMINATION:  VITAL SIGNS:  Weight 133 pounds, blood pressure  118/82, pulse 80.  HEENT:  Jaundice skin.  Icteric sclerae.  LUNGS:  Clear to auscultation bilaterally.  Fairly tense ascites.  ABDOMEN:  Nontender.  EXTREMITIES:  There is 1+ pitting edema in ankles bilaterally.   ASSESSMENT/PLAN:  A 55 year old woman with advanced alcoholic cirrhosis.   She has been not watching her salt intake, so I recommended that she be  on a low salt diet, avoiding any extra salt and staying away from can  foods that may have extra salt in them.  Will also increase her  Aldactone to 150 mg a day and increase her Lasix to 60 mg once daily.  I  have refilled her Ativan, given her 50 pills of that. She does say it  does seem to be helping her with some minor tremors.  She does not have  extreme encephalopathy but she asked about driving again and I told her  that I do not think it is safe for her to drive.  She also asked about  going back to work and I do not see that she  could be very functional at  work in her current state and recommend that she begin filing for long-  term disability.  I told her that her long-term prognosis is still quite  guarded.  She will begin tapering the prednisone that she is on over the  next 2-3 weeks so that by the time she returns to see me in 2-3 weeks,  she will be off of prednisone.  She will get basic metabolic profile  checked in one week after the increase of her water pills.     Rachael Fee, MD  Electronically Signed    DPJ/MedQ  DD: 11/10/2006  DT: 11/10/2006  Job #: [id number]

## 2010-11-18 NOTE — Assessment & Plan Note (Signed)
[facility]                         GASTROENTEROLOGY OFFICE NOTE   NAME:[name] [name], [name] [name]                    MRN:          [id number]  DATE:03/09/2007                            DOB:          1955-07-23    PRIMARY CARE PHYSICIAN:  Dr. Herb Grays.   GI PROBLEM LIST:  Alcoholic cirrhosis, extensive, long history of  drinking, presented winter, 2008 with jaundice and ascites.  Hospitalized April, 2008 for several days.  Her most recent imaging was  an ultrasound April, 2007 showing an echogenic nodular liver consistent  with cirrhosis, no obvious masses.  Ascites.  In June 2008, difficult to  control electrolytes and ascites. Renal consultation with Dr.  Arrie Aran. The patient is currently on Lasix 80 mg t.i.d. and very low  dose aldactone 25 mg once daily with poor control of ascites.  Early  July 2008, large volume paracentesis with 9 liters of ascites removed  (with albumin infusion).  Chronic liver disease workup hepatitis A  antibody positive (old exposure). Hepatitis B negative, hepatitis C  negative;  alpha 1 antitrypsin ceruloplasmin, ANA, AMA, antismooth  muscle antibody all normal. Iron tests essentially normal.   INTERVAL HISTORY:  I last saw [name] 1 month ago.  Since then she has  done very well.  Her edema is very well controlled on 80 of Lasix t.i.d.  and 25 mg of spironolactone once daily.  She takes 20 mEq of potassium  every day.  She is back to working about half day.  She is not drinking,  her last drink was at least 5 months ago.  She feels great.   CURRENT MEDICINES:  1. Advair.  2. B-12.  3. Citracal.  4. Lasix 80 mg t.i.d.  5. Spironolactone 25 mg once daily.  6. Proventil.  7. Klor-Con 20 mEq once daily.   PHYSICAL EXAMINATION:  Weight 115 pounds which is up 6 pounds since her  last visit, blood pressure 122/84, pulse 80.  CONSTITUTIONAL:  Generally well-appearing.  ABDOMEN:  Soft, nontender, nondistended, no obvious  ascites.  EXTREMITIES:  No lower extremity edema.   ASSESSMENT/PLAN:  A 55 year old woman with alcoholic cirrhosis  complicated by edema under good control on current diuretics.   She just had her second hepatitis B shot today.  She will get a basic  set of labs including liver tests, coags, electrolytes and I will adjust  her diuretics if needed based on her electrolytes.  Will arrange for her  to have an esophagogastroduodenoscopy to screen her for varices in the  near future.  I do not see that she has had an alpha fetoprotein so I  will have that drawn today too.  Lastly I will arrange for her to be  evaluated by liver transplant center at [facility].  She has really done well  over the past couple months but at her young age I feel that she  probably will eventually require liver transplantation and getting to  know their team is probably a good idea now.  She will return to see me  in 6-8 weeks and sooner if needed.     Reuel Boom  Marye Round, MD  Electronically Signed    DPJ/MedQ  DD: 03/09/2007  DT: 03/09/2007  Job #: [id number]   cc:   Tammy R. Collins Scotland, M.D.

## 2010-11-18 NOTE — Assessment & Plan Note (Signed)
[facility]                         GASTROENTEROLOGY OFFICE NOTE   [name], [name] HENSLER                    MRN:          [id number]  DATE:12/21/2006                            DOB:          [birth year]    GI FOLLOWUP NOTE:   GI PROBLEM LIST:  Alcoholic cirrhosis, extensive, long history of  drinking, presented winter, 2008 with jaundice and ascites.  Hospitalized April, 2008 for several days.  Her most recent imaging was  an ultrasound April, 2007 showing an echogenic nodular liver consistent  with cirrhosis, no obvious masses.  Ascites.  Her most recent liver  tests were late April, 2008, bilirubin improving.  Total bili 7.8, was  as high as 20.  Platelets 160, INR 2.1, AST 59, ALT 30, albumin 2.4.  Sodium 121, potassium 5.3.   INTERVAL HISTORY:  I last saw [name] [name] 2 weeks ago for difficulties  with her hyponatremia and her hyperkalemia.  I had decreased her Lasix  to 40 mg a day and decreased her Spironolactone to 50 mg a day,  rechecked her basic metabolic profile, and her sodium actually went even  lower to 118.  Dr. Leone Payor, one of my partners, changed her diuretics by  stopping her Spironolactone completely and starting her Lasix back up at  60 mg twice a day.  She had electrolytes rechecked yesterday showing a  potassium of 4.2, sodium of 123.  Her creatinine is 0.7.  Her liver  tests continued to slowly improve; now her total bilirubin is 5.5.  She  has gained 10 pounds since her last visit.  She is getting increasingly  uncomfortable in her abdomen and her legs.  She has had to buy looser-  fitting shoes even.   CURRENT MEDICINES:  1. Advair twice daily.  2. Lasix 60 mg twice a day.  3. Prenatal vitamin.  4. Proventil.  5. Ativan.  6. B-12.  7. Citrucel.   PHYSICAL EXAMINATION:  VITAL SIGNS:  Weight 133 pounds, which is up 13  pounds since her last visit.  Blood pressure 98/66, pulse 88.  CONSTITUTIONAL:  Jaundiced  appearing.  Alert and oriented x3.  Mild  tremor but no asterixis.  ABDOMEN:  Somewhat tense ascites.  2 to 3+ edema bilaterally in her  ankles.   ASSESSMENT AND PLAN:  A 55 year old woman with advanced alcoholic  cirrhosis complicated by edema, hyponatremia.   I will adjust her Lasix further.  She will go up to 80 mg twice a day.  She will call me on Friday, just 2 or 3 days from now, if she is still  slipping in terms of her edema, and then I will change this to 80 mg  three times a day.  Will also get a renal consultation for help with her  hyponatremia and her very difficult-to-manage electrolytes and fluid  status.  She has not restarted drinking.  It has been 2-3 months now.  Hopefully, we can get her to the 5-6 month  period, at which time, if she is still sober at that point, I will be  referring her for consideration for liver transplantation.  She will  return to see me in 1-2 weeks, and sooner if needed.     Rachael Fee, MD  Electronically Signed    DPJ/MedQ  DD: 12/21/2006  DT: 12/22/2006  Job #: [id number]   cc:   Tammy R. Collins Scotland, M.D.

## 2010-11-18 NOTE — Assessment & Plan Note (Signed)
[facility]                         GASTROENTEROLOGY OFFICE NOTE   NAME:[name] [name], [name] [name]                    MRN:          [id number]  DATE:07/04/2007                            DOB:          [birth year]    PRIMARY CARE PHYSICIAN:  Tammy R. Collins Scotland, M.D.   LIVER TRANSPLANT HEPATOLOGIST:  Jason Coop, MD at [facility]   GI PROBLEM LIST:  Alcoholic cirrhosis, extensive, long history of  drinking, presented winter, 2008 with jaundice and ascites.  Hospitalized April, 2008 for several days.  Her most recent imaging was  an ultrasound April, 2007 showing an echogenic nodular liver consistent  with cirrhosis, no obvious masses.  Ascites.  In June 2008, difficult to  control electrolytes and ascites. Renal consultation with Dr.  Arrie Aran. The patient is currently on Lasix 80 mg t.i.d. and very low  dose aldactone 25 mg once daily with poor control of ascites.  Early  July 2008, large volume paracentesis with 9 liters of ascites removed  (with albumin infusion).  Chronic liver disease workup hepatitis A  antibody positive (old exposure). Hepatitis B negative, hepatitis C  negative;  alpha 1 antitrypsin ceruloplasmin, ANA, AMA, antismooth  muscle antibody all normal. Iron tests essentially normal.  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  Started on nadolol 20 mg  daily.   INTERVAL HISTORY:  I last saw [name] [name] 2-1/2 months ago. Since then, she  has done very well. 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 She has  had no recurrence of edema, abdominal or in her extremities. She moves  her bowels once a day. She did have an acute confusional episode 3-4  weeks ago that lasted less than a half hour. She said she was wearing a  lot of clothes and felt very hot and so perhaps this had something to do  with  it. This does not sound like hepatic encephalopathy due to its  onset and resolution. She has not had anything to drink in many months.  She had a urine drug screen mid December that was negative for all  substances including alcohol. She is due to see Dr. Julieta Gutting again in  early March.   CURRENT MEDICATIONS:  1. Advair.  2. B12.  3. Citracal.  4. Lasix 80 mg t.i.d.  5. Spironolactone 25 mg once daily.  6. Potassium 20 mg once daily.  7. Prenatal vitamins.  8. Nadolol 20 mg once daily.   PHYSICAL EXAMINATION:  Weight 118 pounds, which is up one pound since  her last visit. Blood pressure 110/80, pulse 60.  CONSTITUTIONAL: Generally well-appearing. Anicteric sclerae.  LUNGS:  Clear to auscultation bilaterally.  ABDOMEN: Soft and nontender, nondistended. No obvious ascites.  EXTREMITIES: No lower extremity edema.   ASSESSMENT/PLAN:  A 55 year old woman with alcoholic cirrhosis  complicated by edema and portal hypertension.   [name] [name] is doing very well. She will get a repeat set of liver tests and  electrolytes today. If her electrolytes  and creatinine are in the normal  range, I would not adjust her diuretics, although she is admittedly on a  fairly unusual regimen. She is due to see Dr. Julieta Gutting again in two  months time and will return to see me 2-3 weeks after that.     Rachael Fee, MD  Electronically Signed    DPJ/MedQ  DD: 07/04/2007  DT: 07/04/2007  Job #: [id number]   cc:   Tammy R. Collins Scotland, M.D.  Jason Coop, MD

## 2010-11-18 NOTE — Assessment & Plan Note (Signed)
[facility]                         GASTROENTEROLOGY OFFICE NOTE   NAME:[name] [name], [name] [name]                    MRN:          [id number]  DATE:12/07/2006                            DOB:          07-30-1955    GI PROBLEM LIST:  Alcoholic cirrhosis, extensive, long history of  drinking, presented winter, 2008 with jaundice and ascites.  Hospitalized April, 2008 for several days.  Her most recent imaging was  an ultrasound April, 2007 showing an echogenic nodular liver consistent  with cirrhosis, no obvious masses.  Ascites.  Her most recent liver  tests were late April, 2008, bilirubin improving.  Total bili 7.8, was  as high as 20.  Platelets 160, INR 2.1, AST 59, ALT 30, albumin 2.4.  Sodium 121, potassium 5.3.   HISTORY:  I last saw [name] [name] about a month ago.  We have been  following her electrolytes quite closely.  Her sodium dipped to the low  120s.  She was very difficult to get in touch with.  It turns out she  has been drinking a lot of extra free water, and so, more recently, she  stopped that.  I have decreased her diuretics a bit as well.  Her most  recent electrolytes are summarized above.  She obviously still has some  mild hyperkalemia and hyponatremia.  She has not restarted drinking.   CURRENT MEDICINES:  1. Advair.  2. Lasix 60 mg once daily.  3. Spironolactone 100 mg once daily.  4. Potassium chloride, unclear what dose, she takes once daily.  5. Vitamin B12.  6. Citrucel.   PHYSICAL EXAMINATION:  Weight 120 pounds, which is down 13 pounds since  her visit 1 month ago.  Blood pressure 118/70, pulse 84.  CONSTITUTIONAL:  Less jaundiced appearing.  Alert and oriented x3.  Still very mild, fine tremor.  I do not believe this is asterixis.  ABDOMEN:  Soft, obvious ascites, although not tense.  EXTREMITIES:  Trace edema in right ankle, 1+ edema in left ankle.   ASSESSMENT AND PLAN:  A 55 year old woman with advanced alcoholic  cirrhosis.  She has had issues with her electrolytes.  I see that she is  on a potassium supplement, and we will have her hold that.  She will  call back today with the exact dose of what she is for our own records.  She will decrease the Lasix to 40 mg a day, and decrease spironolactone  to 50 mg a day.  She will have a basic metabolic profile checked in 1  week's time.  Of note, her sodium has been in the low 120s for at least  4-5 weeks.  She has been drinking a lot of extra water, and has been  cutting that back the past couple of days.  Overall, I am encouraged  that her bilirubin is about a third of what it was at it's height,  although she obviously has underlying cirrhosis.  She will return to see  me in 2 weeks' time, sooner if needed.   She asked that I communicate with her employer about her condition, and  I  will forward this note to them.  I cannot provide an anticipated date  of return to work.     Rachael Fee, MD  Electronically Signed    DPJ/MedQ  DD: 12/07/2006  DT: 12/07/2006  Job #: [id number]   cc:   Mr. Nancy Nordmann

## 2010-11-19 ENCOUNTER — Ambulatory Visit (HOSPITAL_COMMUNITY)
Admission: RE | Admit: 2010-11-19 | Discharge: 2010-11-19 | Disposition: A | Discharge status: Home / Self Care | Payer: 59 | Source: Ambulatory Visit | Attending: *Deleted | Admitting: *Deleted

## 2010-11-19 ENCOUNTER — Ambulatory Visit (HOSPITAL_COMMUNITY)
Admission: RE | Admit: 2010-11-19 | Discharge: 2010-11-19 | Disposition: A | Discharge status: Home / Self Care | Payer: 59 | Source: Ambulatory Visit | Attending: Gastroenterology | Admitting: Gastroenterology

## 2010-11-19 ENCOUNTER — Other Ambulatory Visit (HOSPITAL_COMMUNITY): Payer: Self-pay | Admitting: *Deleted

## 2010-11-19 ENCOUNTER — Telehealth: Payer: Self-pay | Admitting: Gastroenterology

## 2010-11-19 DIAGNOSIS — F1020 Alcohol dependence, uncomplicated: Secondary | ICD-10-CM | POA: Insufficient documentation

## 2010-11-19 DIAGNOSIS — K7030 Alcoholic cirrhosis of liver without ascites: Secondary | ICD-10-CM | POA: Insufficient documentation

## 2010-11-19 DIAGNOSIS — K5900 Constipation, unspecified: Secondary | ICD-10-CM | POA: Insufficient documentation

## 2010-11-19 DIAGNOSIS — K7460 Unspecified cirrhosis of liver: Secondary | ICD-10-CM

## 2010-11-19 DIAGNOSIS — K449 Diaphragmatic hernia without obstruction or gangrene: Secondary | ICD-10-CM | POA: Insufficient documentation

## 2010-11-19 MED ORDER — IOHEXOL 350 MG/ML SOLN
100.0000 mL | Freq: Once | INTRAVENOUS | Status: AC | PRN
  Administered 2010-11-19: 100 mL via INTRAVENOUS

## 2010-11-19 NOTE — Telephone Encounter (Signed)
Dr Christella Hartigan pt would like you to see her recent CT scan Meld 11 now.  She just wants you to have all the info.

## 2010-11-20 NOTE — Telephone Encounter (Signed)
Please call her.  The CT looks good; no masses in liver.  There was a question of fullness in stomach but she had EGD less than a year ago and it was normal. I am not worried about gastric lesion.

## 2010-11-20 NOTE — Telephone Encounter (Signed)
Pt aware.

## 2010-11-21 NOTE — Consult Note (Signed)
[name] [name], [name] [name]             ACCOUNT NO.:  [id number]   MEDICAL RECORD NO.:  [id number]          PATIENT TYPE:  INP   LOCATION:  1844                         FACILITY:  [facility]   PHYSICIAN:  Rachael Fee, MD   DATE OF BIRTH:  11-17-1955   DATE OF CONSULTATION:  DATE OF DISCHARGE:                                 CONSULTATION   GI CONSULTATION:   PRIMARY GASTROENTEROLOGIST:  Dr. Lina Sar.   REASON FOR REFERRAL:  Dr. Drue Novel asked me to evaluate [name] [name] in  consultation regarding hepatitic failure.   HPI:  [name] [name] is a pleasant 55 year old alcoholic who has been  jaundice for several months.  She saw Dr. Tawanna Cooler as an outpatient in early  February.  He recommended, very strongly, that she needed hospital  admission.  At that point, her bilirubin was 10.  She declined admission  and has finally resurfaced after several more months of drinking vodka  and orange juice on a daily basis.  She noticed the yellowing in her  eyes, getting worse and worse and in the past 2-3 weeks, she has had  increased swelling in her abdomen and in her legs.   She has had no hematemesis.  No hematochezia.  No melena.  No fevers or  chills.  No specific abdominal pains, although her abdominal girth is  causing some general abdominal discomfort.  She has taken no Tylenol in  the past several months.   LABS IN THE EMERGENCY ROOM:  Show a CBC with a hemoglobin of 11.9, an  MCV of 113, platelets of 43,000.  Total bili of 19.2, AST of 246, ALT of  39, albumin of 2.9.  INR of 2.2.  Sodium of 131, BUN 5, creatinine 0.6,  lipase of 140 and an ammonia level of 44.   REVIEW OF SYSTEMS:  Is notable for no fevers, chills.  Mild shortness of  breath has increased as her abdominal girth has increased.  No marked  confusion.  The rest of her review of systems is essentially normal.   PAST MEDICAL HISTORY:  1. Alcoholism.  2. Hypertension.  3. Asthma.  4. Gastroesophageal reflux disease.   OUTPATIENT MEDICINES:  Zantac, Advair, Proventil.   ALLERGIES:  TO SULFA ANTIBIOTICS.   SOCIAL HISTORY:  Nonsmoker.  Drinks 4-5 vodka drinks on a daily basis.  Married.   FAMILY HISTORY:  Noncontributory.   PHYSICAL EXAMINATION:  VITAL SIGNS:  Afebrile.  Blood pressure 130/90.  Pulse 101, 97% on room air.  CONSTITUTIONAL:  Deeply jaundiced.  Cachectic, except for her  protuberant abdomen.  HEENT:  Eyes:  Icteric.  Extraocular movements intact.  Mouth:  Oropharynx moist.  No lesions.  NECK:  Supple.  No  lymphadenopathy.  LUNGS:  Clear to auscultation bilaterally.  HEART:  Regular rate and rhythm.  ABDOMEN:  Distended.  Obvious large amount of tense ascites.  No caput  medusae.  EXTREMITIES:  Two plus edema bilaterally in her legs.  Also, mild  asterixis.  SKIN:  No rashes or lesions on visible extremities.   ASSESSMENT/PLAN:  [age]-old woman with acute alcohol hepatitis,  it  is unclear if she has underlying cirrhosis given her many years of  significant alcohol abuse.  She probably does have underlying cirrhosis.  I spoke very clearly with her and her husband about the life threatening  nature of her disease and that if she survives this episode and returns  to drinking, it is unlikely that she will be alive in 1-2 years from  now.  I think it is important to determine whether she does have  underlying cirrhosis and so I will arrange for her to have abdominal  ultrasound to see if she has nodular contours to her liver.  She will  also be started on diuretics at 100 of Aldactone daily and 40 of Lasix  daily.  She will also be arranged to have a large volume paresthesias to  help her with some of the abdominal discomfort that she is experiencing.  The fluid will need to be sent for the usual testing, including cell  count, differential, culture, albumin and total protein level.  There is  some data that steroids may be of some benefit.  I will calculate her  hepatitis  discriminant function after we make sure that she is not  infected or bleeding, which I do not think is the case and if she  qualifies, I will start her on the appropriate steroids.  There is also  data encouraging of eating is important and we will do that for her.  She has not been without alcohol for more than a day or two, so it is  not clear that she will go into DTs after the next 48-72 hours.  She has  some shakes now, that is either alcoholic withdrawal, the beginning of  DTs or some asterixis.  She will be on the alcohol withdrawal pathway.      Rachael Fee, MD  Electronically Signed     DPJ/MEDQ  D:  10/15/2006  T:  10/16/2006  Job:  [id number]   cc:   Tinnie Gens A. Tawanna Cooler, MD  Hedwig Morton. Juanda Chance, MD

## 2010-11-21 NOTE — Assessment & Plan Note (Signed)
HEALTHCARE                            [facility] OFFICE NOTE   NAME:Crist, OMUNIQUE PEDERSON                    MRN:          [id number]  DATE:08/06/2006                            DOB:          06/09/1956    PHYSICAL EVALUATION:  Ms. Colquhoun is a 54 year old married female who  comes in today for evaluation of nose bleeds, jaundice, anxiety,  depression and alcohol abuse.   The patient was noted back in 2003 to be an alcoholic.  At that time she  was sent to see Dr. Lina Sar for a complete GI evaluation.  She  subsequently has continued to drink and comes in today extremely  jaundiced, having recurrent nose bleeds, weight loss from 140 to 127  over a 2 month period of time.  Blood pressure down to 97/53.   PAST MEDICAL HISTORY:  See diagnostic and evaluation form of 2003.  There has been essentially no change.   CURRENT MEDICATIONS:  1. Diovan/hydrochlorothiazide 80/12.5 one daily.  2. Zantac 300 daily.  3. Advair 1 puff b.i.d.  4. Allegra 180 daily.   She denies smoking but she continues to drink excessively.   PHYSICAL EVALUATION:  Height 5 feet 2-3/4 inches.  Weight 127.  Temp 97.  Pulse 70 and regular.  Respirations 12 regular.  BP 97/53.  GENERAL:  She was a thin, jaundiced female.  HEAD, EYES, EARS, NOSE, THROAT:  Were negative except for marked scleral  icterus and skin icterus.  NECK:  Was supple. Thyroid was not enlarged.  CHEST:  Was clear to auscultation.  CARDIAC:  Exam negative.  BREAST:  Exam was negative.  She has had a reduction mammoplasty.  ABDOMEN:  Shows the liver to be markedly enlarged at 6 to 8 cm below the  right costal margin and it is nodular.  The rest of the abdominal exam  was negative.  RECTUM:  Stool was guaiac positive and normal color and of course it was  jaundiced stool, yellow stool, but guaiac positive.   LABORATORY DATA:  Shows a hemoglobin of 13, a platelet count of 35,000,  cholesterol 349,  triglycerides 270, blood sugar 107, alk phos 122, SGOT  300, SGPT 43, total bilirubin 10.7, TSH 10.57.   1. Acute hepatic failure, secondary to alcoholism with marked jaundice      and gastrointestinal bleed.  Plan:  Admit stat for immediate      evaluation and treatment.  The patient declined, she says she is      going to go home, she does not want any therapy.  I then tried to      talk to her about [facility].  She declined in that too. I      then tried to get her husband involved, but she said she would not      allow me to speak with him.  2. Hypertension.  Blood pressure low because she is not metabolizing      her medication.  I asked her to hold her Diovan.  3. Allergic rhinitis.  Continue Allegra p.r.n.  4. History of asthma.  Continue  Advair 1 puff b.i.d.  5. Gastroesophageal reflux disease.  Continue Zantac 300 daily.   Again, an hour was spent with the patient trying to convince her to go  to the hospital to be admitted today for evaluation, over and over again  the patient declined.  EKG, tetanus booster and Pneumovax were done.     Jeffrey A. Tawanna Cooler, MD  Electronically Signed    JAT/MedQ  DD: 08/06/2006  DT: 08/06/2006  Job #: [id number]   cc:   Hedwig Morton. Juanda Chance, MD

## 2010-11-21 NOTE — Assessment & Plan Note (Signed)
[facility] OFFICE NOTE   NAME:[name] [name], [name] [name]                    MRN:          [id number]  DATE:08/20/2006                            DOB:          [birth year]    The patient was originally seen on August 06, 2006 for a physical  evaluation.  At that time she was noted be markedly jaundice secondary  to chronic and acute alcohol abuse.  At that juncture we recommended  stat admission, indeed the chemistry showed she was in marked liver  failure with elevation of all of her enzymes including a marked  elevation of her bilirubin.  At that juncture her bilirubin was 43.   The patient declined admission.  She also declined outpatient therapy.  She was asked to come back in one week for follow up, hoping she would  reconsider.  The patient never came back for follow up.   As I explained in my written note, she is in a marked hepatic failure.  She needs immediate hospitalization and it is most probable that this  liver failure is not reversible, however I think we ought to try.  However, the patient declined to even try.  She understands that the  consequences of not coming to hospital include bleeding, hepatic  failure, death, etc.  She accepts this responsibility, but again refuses  either outpatient or inpatient treatment.     Jeffrey A. Tawanna Cooler, MD  Electronically Signed    JAT/MedQ  DD: 08/20/2006  DT: 08/20/2006  Job #: [id number]

## 2010-11-21 NOTE — Discharge Summary (Signed)
[name] [name], [name] [name]             ACCOUNT NO.:  [id number]   MEDICAL RECORD NO.:  [id number]          PATIENT TYPE:  INP   LOCATION:  5709                         FACILITY:  [facility]   PHYSICIAN:  Iva Boop, MD,FACGDATE OF BIRTH:  [birth year]   DATE OF ADMISSION:  10/15/2006  DATE OF DISCHARGE:  10/22/2006                               DISCHARGE SUMMARY   PROCEDURE:  Paracentesis with removal of 4.5 L of sterile fluid October 16, 2006 by Michael Litter PA-C for interventional radiology.   ADMISSION DIAGNOSES:  1. Chronic alcoholism.  2. Longstanding jaundice dating back to February 1.  Her total      bilirubin was 10.7 on July 30, 2006.  Hemoglobin at time of this      admission was 19.2.  3. History of asthma.  4. History of hypertension.  5. Gastroesophageal reflux disease.  6. Status post breast reduction surgery.  7. 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  8. 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  Her      platelets on August 04, 2006 measured 35.  9. Hyperlipidemia with cholesterol of 349, triglycerides of 270 and      HDL 35.8,  LDL of 54, August 04, 2006.  10.Mild anemia with marked macrocytosis.   DISCHARGE DIAGNOSES:  1. Acute alcoholic hepatitis on top of chronic alcoholic cirrhosis.  2. Marked jaundice.  Has begun treatment with prednisolone for      alcoholic hepatitis.  She has had some improvement in her total      bilirubin during the course of hospital stay.  3. New onset coagulopathy secondary to synthetic liver dysfunction.  4. Questionable urinary tract infection.  On analysis and recheck of      urinalysis there is no evidence to support active urinary tract      infection and antibiotics were discontinued.  5. Mild increase of lipase.  No evidence for active pancreatitis.  6. Incidental gallbladder sludge seen on ultrasound.  7. Ascites, status post paracentesis.  No evidence for pancreatitis      and  also no biliary ductal dilatation seen on abdominal ultrasound.  8. Mild hyperglycemia.  9. Macrocytic anemia.  Hemoglobin and hematocrit stable following      initial decline.  No active gastrointestinal bleeding.  10.Hypokalemia, corrected in daily potassium supplementation      initiated.  11.Gait instability, multifactorial, probably secondary to chronic      alcohol abuse.  12.The patient may have a small element of hepatic encephalopathy, but      mentally she is clear.  13.Mildly elevated thyroid stimulated hormone.   BRIEF HISTORY:  [name] [name] is a 55 year old white female who is an  alcoholic.  When she was seen by Dr. Tawanna Cooler in the office in February, she  was jaundiced and was advised that she should be admitted to the  hospital for treatment.  She declined admission and returned to her  daily four to five vodka and orange juice drinks daily.  She has become  more noticeably jaundiced.  She has also had developed swelling in her  legs and abdomen.  It was because of swelling that she presented to the  emergency room and was admitted by Dr. Willow Ora.  He initiated diuretics  of Aldactone and Lasix as well as called for GI consult.   LABORATORY:  Hemoglobin on admission was 11.9.  It was 10.4 at  discharge.  Hematocrit 30 at discharge.  Platelets ranged from 43 on  admission to 53 near discharge.  White blood cell count was 9.4. MCV was  115.6.  Hematocrit 33.8 at admission and 30 at discharge.  Differential  on blood count was high mostly normal with the exception of slight  decrease in lymphocytes at 10%.  PT 29, INR 2.6 and PTT of 56.  Serial  assay of PT/INR showed some increase in measurements during the course  of hospitalization.  Sodium 131 at admission, 139 at discharge.  Potassium low at 2.7.  It was 3.5 at discharge.  BUN 5, creatinine 0.9.  Glucose ranging 101 to 122.  Chloride 92, CO2 30.  Calcium low at 7.9.  Total protein 5.4.  Albumin 2.3.  Total bilirubin  19.2 on admission,  17.9 near discharge.  Direct bilirubin 9.5 on admission.  Alkaline  phosphatase ranging 101 to 95.  AST 246 on admission, 114 at discharge.  ALT 39 on admission, 31 at discharge.  Albumin 2.2.  TSH 9.14. Hepatitis  A total antibody positive.  The hepatitis A IgM was negative.  Hepatitis  B surface antigen, hepatitis B surface antibody, hepatitis C antibody  all negative.  Analysis of ascitic fluid showed clear yellow appearance,  8 white blood cells of which 9% were neutrophils, 39% lymphocytes, 52%  macrophages.  Mesothelial cells were present in the ascites.  The  ascitic protein level was less than 3 and the ascitic albumin level less  than 1.  Initial urinalysis showed positive nitrites, small amount of  leukocyte esterase, but many epithelials and only 0-2 white blood cells  and red blood cells.  Bacteria were a few.  On redo urinalysis was a  large amount of bilirubin, no nitrites, no leukocyte esterase and  microscopic analysis was not pursued.  Ammonia level of 44.  Elevated  TSH 9.146.   HOSPITAL COURSE:  1. Alcoholic hepatitis and cirrhosis.  After admission, the patient      had serial CMETs drawn and this showed steady improvement in the      transaminitis.  Her bilirubin was slower to decline, but it did      show some improvement.  However, she was still quite jaundiced near      the time of discharge.  The patient was advised that she should      completely abstain from alcohol.  She was advised that her overall      prognosis for 5 years arrival was poor and her prognosis would be      much worse should she pick up alcohol again.  She declined      outpatient rehab, but said that she had had her last drink prior to      admission.  Dr. Christella Hartigan started  prednisolone within the first 48      hours of the patient's hospitalization, and she is to continue on      this medication at 40 mg a day for at least 1 month.  This is to     treat the alcoholic  hepatitis.  The patient's appetite improved.      She was a  little bit tired and somewhat out of it, but never truly      confused when she came in.  Overall her psychomotor slowing      improved during the course of this hospitalization.  Her ammonia      level was only minimally elevated at 44 and she was not started on      any lactulose.  2. Chronic thrombocytopenia.  This was unchanged during this      admission.  3. Macrocytic anemia.  Following hydration, the patient's hemoglobin      did drop.  She had no overt bleeding. We did not hemoccult her      stools, but they were brown and benign in appearance.  The patient      may require endoscopic evaluation in the future in the outpatient      setting.  4. Question of UTI.  She was empirically treated for a few days on      Cipro, but the urinalysis was contaminated with multiple squamous      epithelials.  The Cipro was discontinued and recheck of the urine      showed a normal urinalysis.  5. Ascites.  The patient had a paracentesis with removal of 4.5 L of      ascitic fluid on April 12.  The fluid analysis did not show any      evidence for SBE.  She was started on Aldactone 100 mg daily and      furosemide 40 mg daily and has shown steady decline in her weight.      Renal function was monitored and did not show any onset of renal      dysfunction related to the diuretics therapy.  Plan will be to      check CMET next week between hospital departure and a return office      visit with Dr. Christella Hartigan.  6. Hypokalemia.  This was corrected with potassium by mouth and she      has been started on a daily supplement of potassium chloride 20 mEq      daily.  7. Coagulopathy secondary to liver dysfunction.  Coags were monitored      several times during hospital stay and did not show any      improvement.  In fact, her PT and her INR both went up during the      course of hospitalization.  8. Gait instability.  The patient had several  bruises on her body and      did relay that she had fallen when she went to her dentist's office      a couple of weeks ago.  She says she tripped, but in the hospital      her gait was quite unsteady.  The patient's claim was that she was      walking fine when she came into the hospital, but this is      questionable.  The gait instability may reflect some alcohol-      related neuropathy, but in any event, she did have a fall during      the hospitalization and bruised her buttock.  She was seen by PT      and they worked with her on her gait and plan to have some home PT      and plan to initiate home PT after discharge.  They also      recommended, and the patient was ordered, a rolling  walker for use      at home.  9. Elevated TSH.  The level was mildly elevated at 9.14.  Dr. Felicity Coyer      recommended that this be checked in about a month after the patient      has been convalescent and hopefully alcohol free.  CONDITION ON DISCHARGE:  Improved, but overall prognosis in this patient  with advanced cirrhosis and acute alcoholic hepatitis is poor.   DISCHARGE MEDICATIONS:  1. Furosemide 40 mg once daily.  2. Spirolactone 100 mg once daily.  3. Advair discus inhaler to 100/50 1 puff twice daily.  4. Prenatal vitamin (this can be OTC type) once daily.  5. Prednisolone 5 mg tablets eight tablets for a total dose of 40 mg      once daily.  6. Potassium chloride 20 mEq once daily.  7. Proventil inhaler as needed.  8. Ativan 1 mg p.o. q.6h. p.r.n.   The patient is advised to follow complete and total alcohol abstinence.   FOLLOW UP:  1. Physical therapy through [facility] services will be      coming to see the patient at home.  2. Medical appointment with Dr. Rob Bunting at [facility], May 7 at      9:30 a.m.  3. Follow up with Dr. Tawanna Cooler as needed, but should probably have an      appointment made with him regarding the TSH level in about a      month's time.  4.  At this point the bulk of her followup will probably be with GI.   WORK STATUS:  The patient has been advised she cannot return to work and  that for the time being she should not be driving an automobile.   SOCIAL CONSIDERATIONS:  The patient has social work review pending with  the hospital Child psychotherapist.  She does not qualify for any financial  assistance from the hospital or from the pharmacy here because she does  have health care insurance.  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  Another factor is that her husband is a been unemployed for a  month or so and she is now essentially unemployed and it is doubtful  that she will be going back to work.  Social worker is planning to  see  the patient before she is discharged, but the patient will need to  contact the Social Security Administration for assistance with  disability application and talk to her employer about any disability  that she might be provided with through her insurance.      Jennye Moccasin, PA-C      Iva Boop, MD,FACG  Electronically Signed    SG/MEDQ  D:  10/21/2006  T:  10/22/2006  Job:  [phone]   cc:   Tinnie Gens A. Tawanna Cooler, MD

## 2011-03-11 ENCOUNTER — Encounter (INDEPENDENT_AMBULATORY_CARE_PROVIDER_SITE_OTHER): Payer: 59 | Admitting: Ophthalmology

## 2011-03-11 DIAGNOSIS — H3530 Unspecified macular degeneration: Secondary | ICD-10-CM

## 2011-03-11 DIAGNOSIS — H2510 Age-related nuclear cataract, unspecified eye: Secondary | ICD-10-CM

## 2011-03-11 DIAGNOSIS — H43819 Vitreous degeneration, unspecified eye: Secondary | ICD-10-CM

## 2011-04-08 ENCOUNTER — Telehealth: Payer: Self-pay

## 2011-04-08 DIAGNOSIS — K7460 Unspecified cirrhosis of liver: Secondary | ICD-10-CM

## 2011-04-08 NOTE — Telephone Encounter (Signed)
Message copied by Donata Duff on Wed Apr 08, 2011  1:43 PM ------      Message from: Donata Duff      Created: Tue Sep 09, 2010  1:59 PM       Pt needs rov in October needs cmet inr 571.2 prior to visit

## 2011-04-08 NOTE — Telephone Encounter (Signed)
rov and labs in [facility] pt aware

## 2011-04-09 ENCOUNTER — Telehealth: Payer: Self-pay | Admitting: Gastroenterology

## 2011-04-09 DIAGNOSIS — K7460 Unspecified cirrhosis of liver: Secondary | ICD-10-CM

## 2011-04-09 NOTE — Telephone Encounter (Signed)
Dr Christella Hartigan can we add this?

## 2011-04-12 NOTE — Telephone Encounter (Signed)
Yes, please add a fasting lipid panel and fasting blood glucose.  thanks

## 2011-04-13 NOTE — Telephone Encounter (Signed)
Pt aware labs added to [facility]

## 2011-04-14 ENCOUNTER — Other Ambulatory Visit: Payer: Self-pay

## 2011-04-14 MED ORDER — NADOLOL 20 MG PO TABS: 20.0000 mg | ORAL_TABLET | Freq: Every day | ORAL | Status: DC

## 2011-05-01 ENCOUNTER — Other Ambulatory Visit: Payer: Self-pay | Admitting: Gastroenterology

## 2011-05-01 ENCOUNTER — Other Ambulatory Visit (INDEPENDENT_AMBULATORY_CARE_PROVIDER_SITE_OTHER): Payer: 59

## 2011-05-01 DIAGNOSIS — K7460 Unspecified cirrhosis of liver: Secondary | ICD-10-CM

## 2011-05-01 LAB — COMPREHENSIVE METABOLIC PANEL
ALT: 36 U/L — ABNORMAL HIGH (ref 0–35)
AST: 47 U/L — ABNORMAL HIGH (ref 0–37)
BUN: 13 mg/dL (ref 6–23)
CO2: 29 mEq/L (ref 19–32)
Chloride: 102 mEq/L (ref 96–112)
Glucose, Bld: 89 mg/dL (ref 70–99)
Potassium: 4.3 mEq/L (ref 3.5–5.1)
Total Bilirubin: 0.9 mg/dL (ref 0.3–1.2)
Total Protein: 7.9 g/dL (ref 6.0–8.3)

## 2011-05-01 LAB — LIPID PANEL
Cholesterol: 207 mg/dL — ABNORMAL HIGH (ref 0–200)
HDL: 58.4 mg/dL (ref >39.00)
Total CHOL/HDL Ratio: 4
Triglycerides: 46 mg/dL (ref 0.0–149.0)
VLDL: 9.2 mg/dL (ref 0.0–40.0)

## 2011-05-05 ENCOUNTER — Other Ambulatory Visit: Payer: 59

## 2011-05-05 ENCOUNTER — Ambulatory Visit (INDEPENDENT_AMBULATORY_CARE_PROVIDER_SITE_OTHER): Payer: 59 | Admitting: Gastroenterology

## 2011-05-05 ENCOUNTER — Encounter: Payer: Self-pay | Admitting: Gastroenterology

## 2011-05-05 VITALS — BP 118/64 | HR 72 | Ht 63.0 in | Wt 130.0 lb

## 2011-05-05 DIAGNOSIS — K7460 Unspecified cirrhosis of liver: Secondary | ICD-10-CM

## 2011-05-05 NOTE — Progress Notes (Signed)
1. Alcoholic cirrhosis, extensive, long history of  drinking, presented winter, 2008 with jaundice and ascites.  Hospitalized April, 2008 for several days. Her most recent imaging was  an ultrasound April, 2007 showing an echogenic nodular liver consistent  with cirrhosis, no obvious masses. Ascites. In June 2008, difficult to  control electrolytes and ascites. Renal consultation with Dr.  Arrie Aran. The patient is currently on Lasix 80 mg t.i.d. and very low  dose aldactone 25 mg once daily with poor control of ascites. Early  July 2008, large volume paracentesis with 9 liters of ascites removed  (with albumin infusion). Chronic liver disease workup hepatitis A  antibody positive (old exposure). Hepatitis B negative, hepatitis C  negative; alpha 1 antitrypsin ceruloplasmin, ANA, AMA, antismooth  muscle antibody all normal. Iron tests essentially normal. 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 Started on nadolol 20 mg  daily. Summer, 2009 modifying her diuretic regimen to a more Aldactone based regimen.   Most recent EGD: sept 2011: normal EGD (recommended repeat at 26month interval)  Was undergoing pre-transplant evaluation at [facility], early 2010, however her insurance carrier pulled out of [facility].   Currently at [facility], Dr. Hamilton Capri, for pre-transplant workup (they will be doing HCC screening at [facility] imaging and AFPS.    HPI: This is a very pleasant 55 year old woman whom I last saw over a year ago.  In the interim she was diagnosed with a bladder tumor. She was treated with surgery and then bladder washing with bcg.  She had an arthritic reaction that developed in her for several weeks.  From a liver standpoint she has done very well. No edema, no overt GI bleeding. She continues to take Lasix, Aldactone, nadolol.  She has not had screening for hepatoma in about a year. She has not been back to [facility] in a while.    Past Medical History  Diagnosis Date  . Esophageal varices   . Cirrhosis, alcoholic   . Hyponatremia   . GERD (gastroesophageal reflux disease)   . Hypertension   . Psoriasis   . Asthma   . Arthritis     Past Surgical History  Procedure Date  . Bladder surgery     Tumor removed   . Breast reduction surgery     Bilateral     Current Outpatient Prescriptions  Medication Sig Dispense Refill  . b complex vitamins tablet Take 1 tablet by mouth daily.        . calcium citrate-vitamin D 200-200 MG-UNIT TABS Take 1 tablet by mouth daily.        . diclofenac (VOLTAREN) 75 MG EC tablet Take 75 mg by mouth daily.       . Fluticasone-Salmeterol (ADVAIR DISKUS) 100-50 MCG/DOSE AEPB Inhale 1 puff into the lungs every morning.        . furosemide (LASIX) 40 MG tablet Take 40 mg by mouth daily.        . nadolol (CORGARD) 20 MG tablet Take 1 tablet (20 mg total) by mouth daily.  90 tablet  3  . Prenatal Multivit-Min-Fe-FA (PRE-NATAL FORMULA) TABS Take by mouth daily.        Marland Kitchen spironolactone (ALDACTONE) 50 MG tablet Take 50 mg by mouth daily.          Allergies as of 05/05/2011 - Review Complete 05/05/2011  Allergen Reaction Noted  . Lisinopril  12/07/2006  . Sulfamethoxazole w/trimethoprim  12/07/2006  Family History  Problem Relation Age of Onset  . Colon cancer Neg Hx     History   Social History  . Marital Status: Married    Spouse Name: N/A    Number of Children: N/A  . Years of Education: N/A   Occupational History  . Not on file.   Social History Main Topics  . Smoking status: Never Smoker   . Smokeless tobacco: Never Used  . Alcohol Use: No  . Drug Use: No  . Sexually Active: Not on file   Other Topics Concern  . Not on file   Social History Narrative  . No narrative on file      Physical Exam: BP 118/64  Pulse 72  Ht 5\' 3"  (1.6 m)  Wt 130 lb (58.968 kg)  BMI 23.03 kg/m2 Constitutional: generally  well-appearing Anicteric sclera Psychiatric: alert and oriented x3 Abdomen: soft, nontender, nondistended, no obvious ascites, no peritoneal signs, normal bowel sounds No lower extremity edema    Assessment and plan: 55 y.o. female with alcohol cirrhosis  Clinically she is very well compensated cirrhosis. She had a basic set of labs recently showing INR 1.2, bilirubin normal, transaminase is very slightly elevated, normal platelets. She needs hepatoma screening with ultrasound and alpha-fetoprotein. She will continue on her relatively low-dose diuretics and nadolol. She'll return to see me in one year and sooner if needed.

## 2011-05-05 NOTE — Patient Instructions (Addendum)
You will be set up for an ultrasound for cirrhosis, screening for hepatoma.   You will have labs checked today in the basement lab.  Please head down after you check out with the front desk  (AFP) Continue current lasix, nadolol, aldactone doses. Return to see Dr. Christella Hartigan in 1 year, sooner if needed. It is important that you have a relatively low salt diet.  High salt diet can cause fluid to accumulate in your legs, abdomen and even around your lungs. You should try to avoid NSAID type over the counter pain medicines as best as possible. Tylenol is safe to take for 'routine' aches and pains, but never take more than 1/2 the dose suggested on the package instructions (never more than 2 grams per day). Avoid alcohol.

## 2011-05-06 ENCOUNTER — Telehealth: Payer: Self-pay

## 2011-05-06 NOTE — Telephone Encounter (Signed)
Her MELD score is 8.  Thanks

## 2011-05-06 NOTE — Telephone Encounter (Signed)
Pt is aware and thanked me and Dr Christella Hartigan for all our help

## 2011-05-06 NOTE — Telephone Encounter (Signed)
Pt scheduled for abd [place] 05/11/11 8 am 745 arrival nothing to eat or drink after midnight  Dr Stormy Card would like for you to calculate her MELD score, your lap top was not working when she was here yesterday.

## 2011-05-11 ENCOUNTER — Ambulatory Visit (HOSPITAL_COMMUNITY)
Admission: RE | Admit: 2011-05-11 | Discharge: 2011-05-11 | Disposition: A | Discharge status: Home / Self Care | Payer: 59 | Source: Ambulatory Visit | Attending: Gastroenterology | Admitting: Gastroenterology

## 2011-05-11 DIAGNOSIS — K7460 Unspecified cirrhosis of liver: Secondary | ICD-10-CM | POA: Insufficient documentation

## 2011-07-21 ENCOUNTER — Encounter: Payer: Self-pay | Admitting: Gastroenterology

## 2011-08-14 ENCOUNTER — Other Ambulatory Visit: Payer: Self-pay | Admitting: Obstetrics and Gynecology

## 2011-08-14 DIAGNOSIS — Z1231 Encounter for screening mammogram for malignant neoplasm of breast: Secondary | ICD-10-CM

## 2011-08-18 ENCOUNTER — Other Ambulatory Visit: Payer: Self-pay

## 2011-08-18 MED ORDER — FUROSEMIDE 40 MG PO TABS: 40.0000 mg | ORAL_TABLET | Freq: Every day | ORAL | Status: DC

## 2011-08-18 MED ORDER — SPIRONOLACTONE 50 MG PO TABS: 50.0000 mg | ORAL_TABLET | Freq: Every day | ORAL | Status: DC

## 2011-08-21 ENCOUNTER — Other Ambulatory Visit: Payer: Self-pay | Admitting: Obstetrics and Gynecology

## 2011-08-21 DIAGNOSIS — M899 Disorder of bone, unspecified: Secondary | ICD-10-CM

## 2011-08-21 DIAGNOSIS — M949 Disorder of cartilage, unspecified: Secondary | ICD-10-CM

## 2011-09-14 ENCOUNTER — Ambulatory Visit
Admission: RE | Admit: 2011-09-14 | Discharge: 2011-09-14 | Disposition: A | Discharge status: Home / Self Care | Payer: 59 | Source: Ambulatory Visit | Attending: Obstetrics and Gynecology | Admitting: Obstetrics and Gynecology

## 2011-09-14 DIAGNOSIS — M949 Disorder of cartilage, unspecified: Secondary | ICD-10-CM

## 2011-09-14 DIAGNOSIS — M899 Disorder of bone, unspecified: Secondary | ICD-10-CM

## 2011-09-14 DIAGNOSIS — Z1231 Encounter for screening mammogram for malignant neoplasm of breast: Secondary | ICD-10-CM

## 2011-11-09 ENCOUNTER — Telehealth: Payer: Self-pay

## 2011-11-09 DIAGNOSIS — K7460 Unspecified cirrhosis of liver: Secondary | ICD-10-CM

## 2011-11-09 NOTE — Telephone Encounter (Signed)
830 am  11/11/11 arrive 815 am [facility]  For [place] and labs have been entered.  Pt is aware

## 2011-11-09 NOTE — Telephone Encounter (Signed)
Message copied by Donata Duff on Mon Nov 09, 2011  8:13 AM ------      Message from: Donata Duff      Created: Tue May 12, 2011  1:14 PM       Ultrasound shows no sign of tumors, neither did AFP.  She will need BOTH of these tests again in 6 months for hepatoma screening.

## 2011-11-11 ENCOUNTER — Ambulatory Visit (HOSPITAL_COMMUNITY)
Admission: RE | Admit: 2011-11-11 | Discharge: 2011-11-11 | Disposition: A | Discharge status: Home / Self Care | Payer: 59 | Source: Ambulatory Visit | Attending: Gastroenterology | Admitting: Gastroenterology

## 2011-11-11 ENCOUNTER — Other Ambulatory Visit: Payer: 59

## 2011-11-11 DIAGNOSIS — K7460 Unspecified cirrhosis of liver: Secondary | ICD-10-CM | POA: Insufficient documentation

## 2011-11-11 DIAGNOSIS — I868 Varicose veins of other specified sites: Secondary | ICD-10-CM | POA: Insufficient documentation

## 2012-02-12 ENCOUNTER — Other Ambulatory Visit: Payer: Self-pay | Admitting: Dermatology

## 2012-03-11 ENCOUNTER — Encounter (INDEPENDENT_AMBULATORY_CARE_PROVIDER_SITE_OTHER): Payer: 59 | Admitting: Ophthalmology

## 2012-03-11 DIAGNOSIS — H2510 Age-related nuclear cataract, unspecified eye: Secondary | ICD-10-CM

## 2012-03-11 DIAGNOSIS — H3553 Other dystrophies primarily involving the sensory retina: Secondary | ICD-10-CM

## 2012-03-11 DIAGNOSIS — H3530 Unspecified macular degeneration: Secondary | ICD-10-CM

## 2012-03-11 DIAGNOSIS — H43819 Vitreous degeneration, unspecified eye: Secondary | ICD-10-CM

## 2012-03-22 ENCOUNTER — Other Ambulatory Visit: Payer: Self-pay | Admitting: Gastroenterology

## 2012-04-11 ENCOUNTER — Other Ambulatory Visit: Payer: Self-pay | Admitting: Gastroenterology

## 2012-04-12 ENCOUNTER — Encounter: Payer: Self-pay | Admitting: Gastroenterology

## 2012-04-26 ENCOUNTER — Encounter: Payer: Self-pay | Admitting: Gastroenterology

## 2012-05-05 ENCOUNTER — Other Ambulatory Visit: Payer: Self-pay | Admitting: Dermatology

## 2012-05-18 ENCOUNTER — Encounter: Payer: Self-pay | Admitting: Gastroenterology

## 2012-05-18 ENCOUNTER — Ambulatory Visit (INDEPENDENT_AMBULATORY_CARE_PROVIDER_SITE_OTHER): Payer: 59 | Admitting: Gastroenterology

## 2012-05-18 ENCOUNTER — Ambulatory Visit: Payer: 59

## 2012-05-18 VITALS — BP 120/70 | HR 60 | Ht 63.0 in | Wt 141.0 lb

## 2012-05-18 DIAGNOSIS — K7460 Unspecified cirrhosis of liver: Secondary | ICD-10-CM

## 2012-05-18 LAB — COMPREHENSIVE METABOLIC PANEL
ALT: 42 U/L — ABNORMAL HIGH (ref 0–35)
CO2: 31 mEq/L (ref 19–32)
Creatinine, Ser: 0.9 mg/dL (ref 0.4–1.2)
GFR: 66.76 mL/min (ref >60.00)
Total Bilirubin: 1.6 mg/dL — ABNORMAL HIGH (ref 0.3–1.2)

## 2012-05-18 LAB — CBC WITH DIFFERENTIAL/PLATELET
Basophils Absolute: 0.1 10*3/uL (ref 0.0–0.1)
Eosinophils Absolute: 0.3 10*3/uL (ref 0.0–0.7)
HCT: 41 % (ref 36.0–46.0)
Hemoglobin: 13.7 g/dL (ref 12.0–15.0)
Lymphs Abs: 2.4 10*3/uL (ref 0.7–4.0)
MCHC: 33.5 g/dL (ref 30.0–36.0)
MCV: 99.8 fl (ref 78.0–100.0)
Monocytes Absolute: 0.7 10*3/uL (ref 0.1–1.0)
Monocytes Relative: 9.8 % (ref 3.0–12.0)
Neutro Abs: 3.6 10*3/uL (ref 1.4–7.7)
RDW: 13.5 % (ref 11.5–14.6)

## 2012-05-18 NOTE — Progress Notes (Signed)
Review of pertinent gastrointestinal problems: 1. Alcoholic cirrhosis, extensive, long history of drinking, presented winter, 2008 with jaundice and ascites. Hospitalized April, 2008 for several days. Her most recent imaging was  an ultrasound April, 2007 showing an echogenic nodular liver consistent with cirrhosis, no obvious masses. Ascites. In June 2008, difficult to control electrolytes and ascites. Renal consultation with Dr. Arrie Aran. The patient is currently on Lasix 80 mg t.i.d. and very low dose aldactone 25 mg once daily with poor control of ascites. Early July 2008, large volume paracentesis with 9 liters of ascites removed (with albumin infusion). Chronic liver disease workup hepatitis A antibody positive (old exposure). Hepatitis B negative, hepatitis C negative; alpha 1 antitrypsin ceruloplasmin, ANA, AMA, antismooth muscle antibody all normal. Iron tests essentially normal. 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 Started on nadolol 20 mg daily. Summer, 2009 modifying her diuretic regimen to a more Aldactone based regimen.  Most recent EGD: sept 2011: normal EGD (recommended repeat at 37month interval)  Was undergoing pre-transplant evaluation at [facility], early 2010, however her insurance carrier pulled out of [facility].  Currently at [facility], Dr. Hamilton Capri, for pre-transplant workup (they will be doing HCC screening at [facility], imaging and AFPS.  Was bumped off transplant list due to bladder tumor diagnosis in 2012. Most recent MELD 8, 04/2011 Most recent AFP 6, 11/2011 Most recent liver imaging 11/2011 US showed no discrete liver lesions, + cirrhosis    HPI: This is a    very pleasant 56 year old woman whom I last saw about a year ago.  She has been running a lot. Ran 1/2 marathon a few weeks; finished it in about 2 hours.  Had bladder tumor last year, BCG treatment, with reactive arthritis which improved.  The bladder  tumor is possibly cured, undergoing q 79month surveillance with Dr. Laverle Patter.  No overt liver disease symptoms.  On nadolol 20 once daily.  Lasix 40 once daily, aldacont 50 once daily.   Past Medical History  Diagnosis Date  . Esophageal varices   . Cirrhosis, alcoholic   . Hyponatremia   . GERD (gastroesophageal reflux disease)   . Hypertension   . Psoriasis   . Asthma   . Arthritis     Past Surgical History  Procedure Date  . Bladder surgery     Tumor removed   . Breast reduction surgery     Bilateral     Current Outpatient Prescriptions  Medication Sig Dispense Refill  . b complex vitamins tablet Take 1 tablet by mouth daily.        . calcium citrate-vitamin D 200-200 MG-UNIT TABS Take 1 tablet by mouth daily.        . diclofenac (VOLTAREN) 75 MG EC tablet Take 75 mg by mouth every other day.       . Fluticasone-Salmeterol (ADVAIR DISKUS) 100-50 MCG/DOSE AEPB Inhale 1 puff into the lungs every morning.        . furosemide (LASIX) 40 MG tablet TAKE 1 TABLET BY MOUTH ONCE DAILY  30 tablet  PRN  . nadolol (CORGARD) 20 MG tablet TAKE 1 TABLET BY MOUTH DAILY.  90 tablet  3  . Prenatal Multivit-Min-Fe-FA (PRE-NATAL FORMULA) TABS Take by mouth daily.        Marland Kitchen spironolactone (ALDACTONE) 50 MG tablet Take 1 tablet (50 mg total) by mouth daily.  30 tablet  11    Allergies as of 05/18/2012 - Review Complete 05/18/2012  Allergen Reaction Noted  .  Lisinopril  12/07/2006  . Sulfamethoxazole w-trimethoprim  12/07/2006    Family History  Problem Relation Age of Onset  . Colon cancer Neg Hx     History   Social History  . Marital Status: Married    Spouse Name: N/A    Number of Children: N/A  . Years of Education: N/A   Occupational History  . Not on file.   Social History Main Topics  . Smoking status: Never Smoker   . Smokeless tobacco: Never Used  . Alcohol Use: No  . Drug Use: No  . Sexually Active: Not on file   Other Topics Concern  . Not on file   Social  History Narrative  . No narrative on file      Physical Exam: BP 120/70  Pulse 60  Ht 5\' 3"  (1.6 m)  Wt 141 lb (63.957 kg)  BMI 24.98 kg/m2 Constitutional: generally well-appearing Psychiatric: alert and oriented x3 Abdomen: soft, nontender, nondistended, no obvious ascites, no peritoneal signs, normal bowel sounds No lower extremity edema    Assessment and plan: 56 y.o. female with cirrhosis, well compensated.  She is doing Audiological scientist.  Basic set of labs today to restage her liver disease. She needs hepatoma screening with alpha-fetoprotein and ultrasound. Going to defer on her repeat endoscopy at this point her last one was about 2 years ago showed no varices. She will return to see me in a year and sooner if needed. She has been on diclofenac, taking it every other day and her rheumatologist was noting slightly elevated liver tests. I will have those records sent for. Will compare them to her known baseline LFT abnormality. I will let her know when I decide on this. And we'll also inform her of her meld scor after her labs are backe

## 2012-05-18 NOTE — Patient Instructions (Addendum)
You will have labs checked today in the basement lab.  Please head down after you check out with the front desk  (cbc, inr, AFP, CMET) You will be set up for an ultrasound for hepatoma screening. Will defer on repeat EGD for varices surveillance at this point. We will get records from your [facility], Dr. Clent Ridges sent for review (recent lab tests in past 2-3 months). Return to see Dr. Christella Hartigan in 12 months, sooner if needed. Continue your great health habits. A copy of this information will be made available to Dr. Clent Ridges.  You have been scheduled for an abdominal ultrasound at [facility] Radiology (1st floor of hospital) on 05/20/12 at 8 am. Please arrive 15 minutes prior to your appointment for registration. Make certain not to have anything to eat or drink 6 hours prior to your appointment. Should you need to reschedule your appointment, please contact radiology at [phone]. This test typically takes about 30 minutes to perform.

## 2012-05-19 LAB — AFP TUMOR MARKER: AFP-Tumor Marker: 6.3 ng/mL (ref 0.0–8.0)

## 2012-05-20 ENCOUNTER — Ambulatory Visit (HOSPITAL_COMMUNITY)
Admission: RE | Admit: 2012-05-20 | Discharge: 2012-05-20 | Disposition: A | Discharge status: Home / Self Care | Payer: 59 | Source: Ambulatory Visit | Attending: Gastroenterology | Admitting: Gastroenterology

## 2012-05-20 DIAGNOSIS — K7460 Unspecified cirrhosis of liver: Secondary | ICD-10-CM | POA: Insufficient documentation

## 2012-08-22 ENCOUNTER — Other Ambulatory Visit: Payer: Self-pay | Admitting: Obstetrics and Gynecology

## 2012-08-22 DIAGNOSIS — Z1231 Encounter for screening mammogram for malignant neoplasm of breast: Secondary | ICD-10-CM

## 2012-08-24 ENCOUNTER — Telehealth: Payer: Self-pay | Admitting: Gastroenterology

## 2012-08-24 MED ORDER — SPIRONOLACTONE 50 MG PO TABS: 50.0000 mg | ORAL_TABLET | Freq: Every day | ORAL | Status: DC

## 2012-08-24 NOTE — Telephone Encounter (Signed)
rx sent to the pharmacy and pt is aware. 

## 2012-09-02 ENCOUNTER — Other Ambulatory Visit: Payer: Self-pay | Admitting: Gastroenterology

## 2012-09-15 ENCOUNTER — Ambulatory Visit
Admission: RE | Admit: 2012-09-15 | Discharge: 2012-09-15 | Disposition: A | Discharge status: Home / Self Care | Payer: 59 | Source: Ambulatory Visit | Attending: Obstetrics and Gynecology | Admitting: Obstetrics and Gynecology

## 2012-09-15 DIAGNOSIS — Z1231 Encounter for screening mammogram for malignant neoplasm of breast: Secondary | ICD-10-CM

## 2012-11-21 ENCOUNTER — Telehealth: Payer: Self-pay

## 2012-11-21 DIAGNOSIS — K7460 Unspecified cirrhosis of liver: Secondary | ICD-10-CM

## 2012-11-21 NOTE — Telephone Encounter (Signed)
Message copied by Donata Duff on Mon Nov 21, 2012  8:13 AM ------      Message from: Donata Duff      Created: Mon May 23, 2012  9:25 AM                   ----- Message -----         From: Donata Duff, CMA         Sent: 05/23/2012   9:20 AM           To: Donata Duff, CMA             AFP and liver Ultrasound in 6 months to screen again for hepatoma ------

## 2012-11-22 ENCOUNTER — Other Ambulatory Visit: Payer: 59

## 2012-11-22 DIAGNOSIS — K7460 Unspecified cirrhosis of liver: Secondary | ICD-10-CM

## 2012-11-22 NOTE — Telephone Encounter (Signed)
Pt request [facility] radiology she will come in for labs and request her PCP change to Dr Valentina Lucks.  8 am 11/23/12 NPO for [place].  Pt also will have her rheumatologist send labs that were done recently

## 2012-11-23 ENCOUNTER — Ambulatory Visit (HOSPITAL_COMMUNITY)
Admission: RE | Admit: 2012-11-23 | Discharge: 2012-11-23 | Disposition: A | Discharge status: Home / Self Care | Payer: 59 | Source: Ambulatory Visit | Attending: Gastroenterology | Admitting: Gastroenterology

## 2012-11-23 DIAGNOSIS — K7460 Unspecified cirrhosis of liver: Secondary | ICD-10-CM | POA: Insufficient documentation

## 2013-02-11 ENCOUNTER — Encounter: Payer: Self-pay | Admitting: Gastroenterology

## 2013-03-13 ENCOUNTER — Ambulatory Visit (INDEPENDENT_AMBULATORY_CARE_PROVIDER_SITE_OTHER): Payer: Commercial Managed Care - PPO | Admitting: Ophthalmology

## 2013-03-13 DIAGNOSIS — H3553 Other dystrophies primarily involving the sensory retina: Secondary | ICD-10-CM

## 2013-03-13 DIAGNOSIS — H3530 Unspecified macular degeneration: Secondary | ICD-10-CM

## 2013-03-13 DIAGNOSIS — H43819 Vitreous degeneration, unspecified eye: Secondary | ICD-10-CM

## 2013-03-13 DIAGNOSIS — H2510 Age-related nuclear cataract, unspecified eye: Secondary | ICD-10-CM

## 2013-03-23 ENCOUNTER — Other Ambulatory Visit: Payer: Self-pay | Admitting: Gastroenterology

## 2013-04-07 ENCOUNTER — Other Ambulatory Visit: Payer: Self-pay | Admitting: Gastroenterology

## 2013-05-16 ENCOUNTER — Telehealth: Payer: Self-pay | Admitting: Gastroenterology

## 2013-05-16 DIAGNOSIS — K7460 Unspecified cirrhosis of liver: Secondary | ICD-10-CM

## 2013-05-16 NOTE — Telephone Encounter (Signed)
Yes, cbc, inr, cmet, afp.  Thanks

## 2013-05-16 NOTE — Telephone Encounter (Signed)
Does she need to have labs prior to upcoming appt?

## 2013-05-16 NOTE — Telephone Encounter (Signed)
Pt aware and will have labs  

## 2013-05-19 ENCOUNTER — Other Ambulatory Visit (INDEPENDENT_AMBULATORY_CARE_PROVIDER_SITE_OTHER): Payer: 59

## 2013-05-19 DIAGNOSIS — K7460 Unspecified cirrhosis of liver: Secondary | ICD-10-CM

## 2013-05-19 LAB — COMPREHENSIVE METABOLIC PANEL
Albumin: 3.9 g/dL (ref 3.5–5.2)
Alkaline Phosphatase: 110 U/L (ref 39–117)
BUN: 14 mg/dL (ref 6–23)
CO2: 29 mEq/L (ref 19–32)
Creatinine, Ser: 0.7 mg/dL (ref 0.4–1.2)
GFR: 88.28 mL/min (ref >60.00)
Glucose, Bld: 84 mg/dL (ref 70–99)
Potassium: 4.1 mEq/L (ref 3.5–5.1)
Sodium: 139 mEq/L (ref 135–145)
Total Bilirubin: 1.1 mg/dL (ref 0.3–1.2)
Total Protein: 7.3 g/dL (ref 6.0–8.3)

## 2013-05-19 LAB — CBC WITH DIFFERENTIAL/PLATELET
Basophils Relative: 0.8 % (ref 0.0–3.0)
Eosinophils Absolute: 0.2 10*3/uL (ref 0.0–0.7)
HCT: 41.5 % (ref 36.0–46.0)
Hemoglobin: 13.9 g/dL (ref 12.0–15.0)
Lymphocytes Relative: 35.2 % (ref 12.0–46.0)
Lymphs Abs: 2.4 10*3/uL (ref 0.7–4.0)
MCHC: 33.5 g/dL (ref 30.0–36.0)
Neutro Abs: 3.5 10*3/uL (ref 1.4–7.7)
RBC: 4.23 Mil/uL (ref 3.87–5.11)
WBC: 6.9 10*3/uL (ref 4.5–10.5)

## 2013-05-20 LAB — AFP TUMOR MARKER: AFP-Tumor Marker: 4.8 ng/mL (ref 0.0–8.0)

## 2013-05-30 ENCOUNTER — Encounter: Payer: Self-pay | Admitting: Gastroenterology

## 2013-05-30 ENCOUNTER — Ambulatory Visit (INDEPENDENT_AMBULATORY_CARE_PROVIDER_SITE_OTHER): Payer: 59 | Admitting: Gastroenterology

## 2013-05-30 VITALS — BP 134/70 | HR 72 | Ht 63.25 in | Wt 146.1 lb

## 2013-05-30 DIAGNOSIS — K7460 Unspecified cirrhosis of liver: Secondary | ICD-10-CM

## 2013-05-30 NOTE — Progress Notes (Signed)
Review of pertinent gastrointestinal problems:  1. Alcoholic cirrhosis, extensive, long history of drinking, presented winter, 2008 with jaundice and ascites. Hospitalized April, 2008 for several days. Her most recent imaging was an ultrasound April, 2007 showing an echogenic nodular liver consistent with cirrhosis, no obvious masses. Ascites. In June 2008, difficult to control electrolytes and ascites. Renal consultation with Dr. Arrie Aran. The patient is currently on Lasix 80 mg t.i.d. and very low dose aldactone 25 mg once daily with poor control of ascites. Early July 2008, large volume paracentesis with 9 liters of ascites removed (with albumin infusion). Chronic liver disease workup hepatitis A antibody positive (old exposure). Hepatitis B negative, hepatitis C negative; alpha 1 antitrypsin ceruloplasmin, ANA, AMA, antismooth muscle antibody all normal. Iron tests essentially normal. 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 Started on nadolol 20 mg daily. Summer, 2009 modifying her diuretic regimen to a more Aldactone based regimen.  Most recent EGD: sept 2011: normal EGD Was seen at [facility], Dr. Hamilton Capri, for pre-transplant workup. Was bumped off transplant list due to bladder tumor diagnosis in 2012.  Most recent MELD UNOS 10, 05/2013 labs  Most recent AFP normal, 05/2013 Most recent liver imaging 11/2012 US showed no discrete liver lesions, + cirrhosis Hep A immune, immunized for Hep B through work (works at [employer]).   HPI: This is a  very pleasant 57 year old woman whom I last saw about a year ago.  She's been feeling 'fabulous.'  Running a lot.   Will run 3 times during week (3-57miles).  Longer on weekends.  Up to 8 miles. Doing 1/2 marathon in January in [place].    She has had no overt GI bleeding. No trouble with swelling or edema.     Past Medical History  Diagnosis Date  . Esophageal varices   .  Cirrhosis, alcoholic   . Hyponatremia   . GERD (gastroesophageal reflux disease)   . Hypertension   . Psoriasis   . Asthma   . Arthritis     Past Surgical History  Procedure Laterality Date  . Bladder surgery      Tumor removed   . Breast reduction surgery      Bilateral     Current Outpatient Prescriptions  Medication Sig Dispense Refill  . b complex vitamins tablet Take 1 tablet by mouth daily.        . diclofenac (VOLTAREN) 75 MG EC tablet Take 75 mg by mouth every other day.       . Fluticasone-Salmeterol (ADVAIR DISKUS) 100-50 MCG/DOSE AEPB Inhale 1 puff into the lungs every morning.        . furosemide (LASIX) 40 MG tablet TAKE 1 TABLET BY MOUTH ONCE DAILY  30 tablet  PRN  . nadolol (CORGARD) 20 MG tablet TAKE 1 TABLET BY MOUTH DAILY.  90 tablet  3  . Prenatal Multivit-Min-Fe-FA (PRE-NATAL FORMULA) TABS Take by mouth daily.        Marland Kitchen spironolactone (ALDACTONE) 50 MG tablet Take 1 tablet (50 mg total) by mouth daily.  30 tablet  11   No current facility-administered medications for this visit.    Allergies as of 05/30/2013 - Review Complete 05/30/2013  Allergen Reaction Noted  . Lisinopril  12/07/2006  . Sulfamethoxazole-trimethoprim  12/07/2006    Family History  Problem Relation Age of Onset  . Colon cancer Neg Hx     History   Social History  . Marital Status: Married  Spouse Name: N/A    Number of Children: N/A  . Years of Education: N/A   Occupational History  . Not on file.   Social History Main Topics  . Smoking status: Never Smoker   . Smokeless tobacco: Never Used  . Alcohol Use: No  . Drug Use: No  . Sexual Activity: Not on file   Other Topics Concern  . Not on file   Social History Narrative  . No narrative on file      Physical Exam: BP 134/70  Pulse 72  Ht 5' 3.25" (1.607 m)  Wt 146 lb 2 oz (66.282 kg)  BMI 25.67 kg/m2 Constitutional: generally well-appearing Psychiatric: alert and oriented x3 Abdomen: soft, nontender,  nondistended, no obvious ascites, no peritoneal signs, normal bowel sounds     Assessment and plan: 57 y.o. female with cirrhosis  She is continuing to do very well with a current UNOs   meld score of 10. She is on fairly low-dose diuretics. She is up-to-date on hepatoma screening except she needs abdominal ultrasound now. We will set this up for her. She will continue hepatoma screening every 6 months and will return to see me in one year.

## 2013-05-30 NOTE — Patient Instructions (Addendum)
Continue doing well. You will be set up for an ultrasound for hepatoma screening now and every 6 months.  You have been scheduled for an abdominal ultrasound at [facility] Radiology (1st floor of hospital) on 06/06/13 at 730 am. Please arrive 15 minutes prior to your appointment for registration. Make certain not to have anything to eat or drink 6 hours prior to your appointment. Should you need to reschedule your appointment, please contact radiology at [phone]. This test typically takes about 30 minutes to perform. Please return to see Dr. Christella Hartigan in 12 months, sooner if needed.

## 2013-06-06 ENCOUNTER — Other Ambulatory Visit (HOSPITAL_COMMUNITY): Payer: 59

## 2013-06-06 ENCOUNTER — Ambulatory Visit (HOSPITAL_COMMUNITY)
Admission: RE | Admit: 2013-06-06 | Discharge: 2013-06-06 | Disposition: A | Discharge status: Home / Self Care | Payer: 59 | Source: Ambulatory Visit | Attending: Gastroenterology | Admitting: Gastroenterology

## 2013-06-06 DIAGNOSIS — K7460 Unspecified cirrhosis of liver: Secondary | ICD-10-CM | POA: Insufficient documentation

## 2013-06-18 ENCOUNTER — Encounter: Payer: Self-pay | Admitting: Emergency Medicine

## 2013-06-18 ENCOUNTER — Emergency Department (INDEPENDENT_AMBULATORY_CARE_PROVIDER_SITE_OTHER)
Admission: EM | Admit: 2013-06-18 | Discharge: 2013-06-18 | Disposition: A | Discharge status: Home / Self Care | Payer: 59 | Source: Home / Self Care | Attending: Family Medicine | Admitting: Family Medicine

## 2013-06-18 DIAGNOSIS — H538 Other visual disturbances: Secondary | ICD-10-CM

## 2013-06-18 LAB — POCT URINALYSIS DIP (MANUAL ENTRY)

## 2013-06-18 MED ORDER — ERYTHROMYCIN 5 MG/GM OP OINT: 1.0000 "application " | TOPICAL_OINTMENT | Freq: Four times a day (QID) | OPHTHALMIC | Status: DC

## 2013-06-18 NOTE — ED Provider Notes (Signed)
CSN: [id number]     Arrival date & time 06/18/13  1111 History   First MD Initiated Contact with Patient 06/18/13 1112     Chief Complaint  Patient presents with  . Eye Pain    HPI  Eye blurriness and mild pain x 3-4 days  Pt initially noticed mild eye redness late last week.  Blurriness persisted with development of mild eye pain and redness.  No known trauma  Denies any activity that may have caused a foreign body to go in to the eye.  No eye crusting.  Mild headache.  No recent infections.  Baseline hx/o esophageal varices, liver cirrhosis, p  Past Medical History  Diagnosis Date  . Esophageal varices   . Cirrhosis, alcoholic   . Hyponatremia   . GERD (gastroesophageal reflux disease)   . Hypertension   . Psoriasis   . Asthma   . Arthritis    Past Surgical History  Procedure Laterality Date  . Bladder surgery      Tumor removed   . Breast reduction surgery      Bilateral    Family History  Problem Relation Age of Onset  . Colon cancer Neg Hx    History  Substance Use Topics  . Smoking status: Never Smoker   . Smokeless tobacco: Never Used  . Alcohol Use: No   OB History   Grav Para Term Preterm Abortions TAB SAB Ect Mult Living                 Review of Systems  All other systems reviewed and are negative.    Allergies  Lisinopril and Sulfamethoxazole-trimethoprim  Home Medications   Current Outpatient Rx  Name  Route  Sig  Dispense  Refill  . b complex vitamins tablet   Oral   Take 1 tablet by mouth daily.           . diclofenac (VOLTAREN) 75 MG EC tablet   Oral   Take 75 mg by mouth every other day.          . erythromycin [facility]) ophthalmic ointment   Right Eye   Place 1 application into the right eye 4 (four) times daily.   3.5 g   0   . Fluticasone-Salmeterol (ADVAIR DISKUS) 100-50 MCG/DOSE AEPB   Inhalation   Inhale 1 puff into the lungs every morning.           . furosemide (LASIX) 40 MG tablet      TAKE 1 TABLET  BY MOUTH ONCE DAILY   30 tablet   PRN   . nadolol (CORGARD) 20 MG tablet      TAKE 1 TABLET BY MOUTH DAILY.   90 tablet   3   . Prenatal Multivit-Min-Fe-FA (PRE-NATAL FORMULA) TABS   Oral   Take by mouth daily.           Marland Kitchen spironolactone (ALDACTONE) 50 MG tablet   Oral   Take 1 tablet (50 mg total) by mouth daily.   30 tablet   11    BP 131/90  Pulse 57  Temp(Src) 98.5 F (36.9 C) (Oral)  Ht 5\' 3"  (1.6 m)  Wt 144 lb (65.318 kg)  BMI 25.51 kg/m2  SpO2 96% Physical Exam  Constitutional: She is oriented to person, place, and time. She appears well-developed and well-nourished.  HENT:  Head: Normocephalic and atraumatic.  Right Ear: External ear normal.  Left Ear: External ear normal.  Eyes:    R eye  conjunctivitis  Minimal photophobia.  No corneal abrasion on woods lamp.    Neck: Normal range of motion. Neck supple.  Cardiovascular: Normal rate and regular rhythm.   Pulmonary/Chest: Effort normal and breath sounds normal.  Abdominal: Soft.  Neurological: She is alert and oriented to person, place, and time.  Skin: Skin is warm.    ED Course  Procedures (including critical care time) Labs Review Labs Reviewed  URINE CULTURE  POCT URINALYSIS DIP (MANUAL ENTRY)   Imaging Review No results found.  EKG Interpretation    Date/Time:    Ventricular Rate:    PR Interval:    QRS Duration:   QT Interval:    QTC Calculation:   R Axis:     Text Interpretation:              MDM   1. Blurry vision, right eye    DDx for sxs is fairly broad including conjunctivitis, iritis, uveitis.  Vision is decreased in affected eye.  There is very little pain  Given that pt does have a hx/o psoriasis, inflammatory process may need to be consider in the differential.  Discussed overall plan of care with pt.  Will place pt on op erythromycin with plan for pt to follow up with ophthalmology in the am.  Discussed with pt at length that if she develops any  significant pain or worsening vision, to go to ER.  Pt expressed understanding.      Doree Albee, MD 06/18/13 [id number]

## 2013-06-18 NOTE — ED Notes (Signed)
States on Thursday noted right eye to feel scratchy, with redness and pain

## 2013-06-21 ENCOUNTER — Encounter (INDEPENDENT_AMBULATORY_CARE_PROVIDER_SITE_OTHER): Payer: Commercial Managed Care - PPO | Admitting: Ophthalmology

## 2013-06-21 DIAGNOSIS — H3553 Other dystrophies primarily involving the sensory retina: Secondary | ICD-10-CM

## 2013-06-21 DIAGNOSIS — H43819 Vitreous degeneration, unspecified eye: Secondary | ICD-10-CM

## 2013-06-21 DIAGNOSIS — H2000 Unspecified acute and subacute iridocyclitis: Secondary | ICD-10-CM

## 2013-06-21 DIAGNOSIS — H3530 Unspecified macular degeneration: Secondary | ICD-10-CM

## 2013-06-21 DIAGNOSIS — H2510 Age-related nuclear cataract, unspecified eye: Secondary | ICD-10-CM

## 2013-07-14 ENCOUNTER — Telehealth: Payer: Self-pay

## 2013-07-14 NOTE — Telephone Encounter (Signed)
Cannot switch, needs to be a "non-selective B blocker" such as nadolol. The others are not "non-selective"

## 2013-07-14 NOTE — Telephone Encounter (Signed)
Pharmacy has been notified.

## 2013-07-14 NOTE — Telephone Encounter (Signed)
Metoprolol and atenolol is now the covered medications instead of nadalol.  Can she switch to one of the preferred?  She will get the nadalol if she needs to?  Please advise

## 2013-08-11 ENCOUNTER — Other Ambulatory Visit: Payer: Self-pay

## 2013-08-11 DIAGNOSIS — Z1231 Encounter for screening mammogram for malignant neoplasm of breast: Secondary | ICD-10-CM

## 2013-08-28 ENCOUNTER — Other Ambulatory Visit: Payer: Self-pay | Admitting: Gastroenterology

## 2013-09-18 ENCOUNTER — Ambulatory Visit
Admission: RE | Admit: 2013-09-18 | Discharge: 2013-09-18 | Disposition: A | Discharge status: Home / Self Care | Payer: 59 | Source: Ambulatory Visit

## 2013-09-18 DIAGNOSIS — Z1231 Encounter for screening mammogram for malignant neoplasm of breast: Secondary | ICD-10-CM

## 2013-12-07 ENCOUNTER — Telehealth: Payer: Self-pay

## 2013-12-07 ENCOUNTER — Other Ambulatory Visit: Payer: 59

## 2013-12-07 DIAGNOSIS — K7460 Unspecified cirrhosis of liver: Secondary | ICD-10-CM

## 2013-12-07 NOTE — Telephone Encounter (Signed)
Message copied by Barron Alvine on Thu Dec 07, 2013  8:21 AM ------      Message from: LEWIS, Armstrong Creasy L      Created: Thu Jun 08, 2013  1:28 PM       labs (AFP) and [place] for hepatoma screening in 6 months ------

## 2013-12-07 NOTE — Telephone Encounter (Signed)
Pt has been notified.

## 2013-12-07 NOTE — Telephone Encounter (Addendum)
Left message on machine to call back regarding labs and [place]  You have been scheduled for an abdominal ultrasound at [facility] Radiology (1st floor of hospital) on 12/13/13 at 730 am. Please arrive 15 minutes prior to your appointment for registration. Make certain not to have anything to eat or drink 6 hours prior to your appointment. Should you need to reschedule your appointment, please contact radiology at [phone]. This test typically takes about 30 minutes to perform.

## 2013-12-08 LAB — AFP TUMOR MARKER: AFP TUMOR MARKER: 6.2 ng/mL (ref 0.0–8.0)

## 2013-12-13 ENCOUNTER — Ambulatory Visit (HOSPITAL_COMMUNITY)
Admission: RE | Admit: 2013-12-13 | Discharge: 2013-12-13 | Disposition: A | Discharge status: Home / Self Care | Payer: 59 | Source: Ambulatory Visit | Attending: Gastroenterology | Admitting: Gastroenterology

## 2013-12-13 DIAGNOSIS — K7460 Unspecified cirrhosis of liver: Secondary | ICD-10-CM | POA: Insufficient documentation

## 2013-12-15 ENCOUNTER — Other Ambulatory Visit: Payer: Self-pay | Admitting: Obstetrics and Gynecology

## 2013-12-15 DIAGNOSIS — M8580 Other specified disorders of bone density and structure, unspecified site: Secondary | ICD-10-CM

## 2014-01-24 ENCOUNTER — Ambulatory Visit
Admission: RE | Admit: 2014-01-24 | Discharge: 2014-01-24 | Disposition: A | Discharge status: Home / Self Care | Payer: 59 | Source: Ambulatory Visit | Attending: Obstetrics and Gynecology | Admitting: Obstetrics and Gynecology

## 2014-01-24 DIAGNOSIS — M8580 Other specified disorders of bone density and structure, unspecified site: Secondary | ICD-10-CM

## 2014-03-14 ENCOUNTER — Ambulatory Visit (INDEPENDENT_AMBULATORY_CARE_PROVIDER_SITE_OTHER): Payer: Commercial Managed Care - PPO | Admitting: Ophthalmology

## 2014-03-14 DIAGNOSIS — H2510 Age-related nuclear cataract, unspecified eye: Secondary | ICD-10-CM

## 2014-03-14 DIAGNOSIS — H43819 Vitreous degeneration, unspecified eye: Secondary | ICD-10-CM

## 2014-03-14 DIAGNOSIS — H3553 Other dystrophies primarily involving the sensory retina: Secondary | ICD-10-CM

## 2014-04-09 ENCOUNTER — Encounter: Payer: Self-pay | Admitting: Gastroenterology

## 2014-04-09 ENCOUNTER — Other Ambulatory Visit: Payer: Self-pay | Admitting: Gastroenterology

## 2014-06-06 ENCOUNTER — Telehealth: Payer: Self-pay | Admitting: Gastroenterology

## 2014-06-06 NOTE — Telephone Encounter (Signed)
Pt aware and will keep appt on 06/11/14

## 2014-06-11 ENCOUNTER — Other Ambulatory Visit (INDEPENDENT_AMBULATORY_CARE_PROVIDER_SITE_OTHER): Payer: 59

## 2014-06-11 ENCOUNTER — Ambulatory Visit (INDEPENDENT_AMBULATORY_CARE_PROVIDER_SITE_OTHER): Payer: 59 | Admitting: Gastroenterology

## 2014-06-11 ENCOUNTER — Encounter: Payer: Self-pay | Admitting: Gastroenterology

## 2014-06-11 VITALS — BP 102/64 | HR 56 | Ht 62.75 in | Wt 145.4 lb

## 2014-06-11 DIAGNOSIS — K7030 Alcoholic cirrhosis of liver without ascites: Secondary | ICD-10-CM

## 2014-06-11 LAB — CBC WITH DIFFERENTIAL/PLATELET
BASOS ABS: 0.1 10*3/uL (ref 0.0–0.1)
Basophils Relative: 0.9 % (ref 0.0–3.0)
Eosinophils Absolute: 0.5 10*3/uL (ref 0.0–0.7)
Eosinophils Relative: 5.6 % — ABNORMAL HIGH (ref 0.0–5.0)
HEMATOCRIT: 42.5 % (ref 36.0–46.0)
HEMOGLOBIN: 14.4 g/dL (ref 12.0–15.0)
LYMPHS ABS: 3.3 10*3/uL (ref 0.7–4.0)
Lymphocytes Relative: 35.8 % (ref 12.0–46.0)
MCHC: 33.8 g/dL (ref 30.0–36.0)
MCV: 98.3 fl (ref 78.0–100.0)
MONO ABS: 0.7 10*3/uL (ref 0.1–1.0)
MONOS PCT: 8 % (ref 3.0–12.0)
NEUTROS ABS: 4.5 10*3/uL (ref 1.4–7.7)
Neutrophils Relative %: 49.7 % (ref 43.0–77.0)
Platelets: 243 10*3/uL (ref 150.0–400.0)
RBC: 4.33 Mil/uL (ref 3.87–5.11)
RDW: 13.6 % (ref 11.5–15.5)
WBC: 9.1 10*3/uL (ref 4.0–10.5)

## 2014-06-11 LAB — COMPREHENSIVE METABOLIC PANEL
ALT: 30 U/L (ref 0–35)
AST: 31 U/L (ref 0–37)
Albumin: 4.2 g/dL (ref 3.5–5.2)
Alkaline Phosphatase: 101 U/L (ref 39–117)
BUN: 20 mg/dL (ref 6–23)
CO2: 29 meq/L (ref 19–32)
CREATININE: 1 mg/dL (ref 0.4–1.2)
Calcium: 9.7 mg/dL (ref 8.4–10.5)
Chloride: 99 mEq/L (ref 96–112)
GFR: 59.52 mL/min — AB (ref >60.00)
Glucose, Bld: 91 mg/dL (ref 70–99)
Potassium: 4.2 mEq/L (ref 3.5–5.1)
Sodium: 135 mEq/L (ref 135–145)
Total Bilirubin: 1.3 mg/dL — ABNORMAL HIGH (ref 0.2–1.2)
Total Protein: 7.4 g/dL (ref 6.0–8.3)

## 2014-06-11 LAB — PROTIME-INR
INR: 1.3 ratio — ABNORMAL HIGH (ref 0.8–1.0)
Prothrombin Time: 14.6 s — ABNORMAL HIGH (ref 9.6–13.1)

## 2014-06-11 NOTE — Progress Notes (Signed)
Review of pertinent gastrointestinal problems:  1. Alcoholic cirrhosis, extensive, long history of drinking, presented winter, 2008 with jaundice and ascites. Hospitalized April, 2008 for several days. Her most recent imaging was an ultrasound April, 2007 showing an echogenic nodular liver consistent with cirrhosis, no obvious masses. Ascites. In June 2008, difficult to control electrolytes and ascites. Renal consultation with Dr. Marval Regal. The patient is currently on Lasix 80 mg t.i.d. and very low dose aldactone 25 mg once daily with poor control of ascites. Early July 2008, large volume paracentesis with 9 liters of ascites removed (with albumin infusion). Chronic liver disease workup hepatitis A antibody positive (old exposure). Hepatitis B negative, hepatitis C negative; alpha 1 antitrypsin ceruloplasmin, ANA, AMA, antismooth muscle antibody all normal. Iron tests essentially normal. 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 Started on nadolol 20 mg daily. Summer, 2009 modifying her diuretic regimen to a more Aldactone based regimen.   Most recent EGD: sept 2011: normal EGD  Was seen at [facility], Dr. Zollie Scale, for pre-transplant workup. Was bumped off transplant list due to bladder tumor diagnosis in 2012.   Most recent MELD UNOS 10, 05/2013 labs   Most recent AFP normal, 12/2013  Most recent liver imaging 12/2013 US showed no discrete liver lesions, + cirrhosis  Hep A immune, immunized for Hep B through work (works at [employer]).  HPI: This is a   very pleasant 58 year old woman whom I last saw about 1 year ago.  Feels great.  Down 1 pound since a year ago same scale.  Takes diclofenac daily.   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  She is on pretty minimal diuretics and on that has no problem with fluid overload.  Past Medical History  Diagnosis Date  . Esophageal varices   . Cirrhosis,  alcoholic   . Hyponatremia   . GERD (gastroesophageal reflux disease)   . Hypertension   . Psoriasis   . Asthma   . Arthritis     Past Surgical History  Procedure Laterality Date  . Bladder surgery      Tumor removed   . Breast reduction surgery      Bilateral     Current Outpatient Prescriptions  Medication Sig Dispense Refill  . b complex vitamins tablet Take 1 tablet by mouth daily.      . diclofenac (VOLTAREN) 75 MG EC tablet Take 75 mg by mouth daily.     . furosemide (LASIX) 40 MG tablet TAKE 1 TABLET BY MOUTH ONCE DAILY 30 tablet PRN  . nadolol (CORGARD) 20 MG tablet TAKE 1 TABLET BY MOUTH DAILY. 90 tablet 3  . Prenatal Multivit-Min-Fe-FA (PRE-NATAL FORMULA) TABS Take by mouth daily.      Marland Kitchen spironolactone (ALDACTONE) 50 MG tablet TAKE 1 TABLET (50 MG TOTAL) BY MOUTH DAILY. 30 tablet PRN   No current facility-administered medications for this visit.    Allergies as of 06/11/2014 - Review Complete 06/11/2014  Allergen Reaction Noted  . Lisinopril  12/07/2006  . Sulfamethoxazole-trimethoprim  12/07/2006    Family History  Problem Relation Age of Onset  . Colon cancer Neg Hx     History   Social History  . Marital Status: Married    Spouse Name: N/A    Number of Children: N/A  . Years of Education: N/A   Occupational History  . Not on file.   Social History Main Topics  . Smoking status: Never Smoker   . Smokeless tobacco:  Never Used  . Alcohol Use: No  . Drug Use: No  . Sexual Activity: Not on file   Other Topics Concern  . Not on file   Social History Narrative      Physical Exam: Ht 5' 2.75" (1.594 m)  Wt 145 lb 6 oz (65.942 kg)  BMI 25.95 kg/m2 Constitutional: generally well-appearing Psychiatric: alert and oriented x3 Abdomen: soft, nontender, nondistended, no obvious ascites, no peritoneal signs, normal bowel sounds     Assessment and plan: 58 y.o. female with will compensate alcoholic cirrhosis  She hasn't had a sip of alcohol  in many years. She is on fairly minimal diuretics to control her lower extremity edema very well. She is on nadolol for small to medium sized esophageal varices. She needs alpha-fetoprotein and abdominal ultrasound for hepatoma screening and she will have a repeat set of liver tests, CBC, coags to restage her liver disease as well. She will return to see me in one year and sooner if needed.

## 2014-06-11 NOTE — Patient Instructions (Addendum)
You will be set up for an ultrasound for cirrhosis, hepatoma screening, for January 6th.  You have been scheduled for an abdominal ultrasound at [facility] Radiology (1st floor of hospital) on 07/11/14 at 9:00am. Please arrive 15 minutes prior to your appointment for registration. Make certain not to have anything to eat or drink 6 hours prior to your appointment. Should you need to reschedule your appointment, please contact radiology at [phone]. This test typically takes about 30 minutes to perform.  You will have labs checked today in the basement lab.  Please head down after you check out with the front desk  (cbc, cmet, inr, AFP).  It is important that you have a relatively low salt diet.  High salt diet can cause fluid to accumulate in your legs, abdomen and even around your lungs.  You should try to avoid NSAID type over the counter pain medicines as best as possible. Tylenol is safe to take for 'routine' aches and pains, but never take more than 1/2 the dose suggested on the package instructions (never more than 2 grams per day). Continue to avoid alcohol as you have been doing for the past several years.  Please return to see Dr. Ardis Hughs in 1 year, sooner if needed.

## 2014-06-12 LAB — AFP TUMOR MARKER: AFP TUMOR MARKER: 4.8 ng/mL (ref <6.1)

## 2014-06-22 ENCOUNTER — Other Ambulatory Visit: Payer: Self-pay | Admitting: Gastroenterology

## 2014-07-11 ENCOUNTER — Ambulatory Visit (HOSPITAL_COMMUNITY)
Admission: RE | Admit: 2014-07-11 | Discharge: 2014-07-11 | Disposition: A | Discharge status: Home / Self Care | Payer: 59 | Source: Ambulatory Visit | Attending: Gastroenterology | Admitting: Gastroenterology

## 2014-07-11 ENCOUNTER — Ambulatory Visit (HOSPITAL_COMMUNITY): Payer: 59

## 2014-07-11 DIAGNOSIS — K7460 Unspecified cirrhosis of liver: Secondary | ICD-10-CM | POA: Diagnosis present

## 2014-07-11 DIAGNOSIS — K7030 Alcoholic cirrhosis of liver without ascites: Secondary | ICD-10-CM

## 2014-07-12 ENCOUNTER — Telehealth: Payer: Self-pay | Admitting: Gastroenterology

## 2014-07-12 NOTE — Telephone Encounter (Signed)
Please call the patient. US shows no changes, no concerning lesions. Needs repeat US and AFP level in 6 months, rov with me in 1 year. Thanks

## 2014-07-12 NOTE — Telephone Encounter (Signed)
Left message on machine to call back  

## 2014-07-13 NOTE — Telephone Encounter (Signed)
Pt aware.

## 2014-08-29 ENCOUNTER — Other Ambulatory Visit: Payer: Self-pay

## 2014-08-29 DIAGNOSIS — Z9889 Other specified postprocedural states: Secondary | ICD-10-CM

## 2014-08-29 DIAGNOSIS — Z1231 Encounter for screening mammogram for malignant neoplasm of breast: Secondary | ICD-10-CM

## 2014-09-20 ENCOUNTER — Ambulatory Visit
Admission: RE | Admit: 2014-09-20 | Discharge: 2014-09-20 | Disposition: A | Discharge status: Home / Self Care | Payer: 59 | Source: Ambulatory Visit

## 2014-09-20 DIAGNOSIS — Z9889 Other specified postprocedural states: Secondary | ICD-10-CM

## 2014-09-20 DIAGNOSIS — Z1231 Encounter for screening mammogram for malignant neoplasm of breast: Secondary | ICD-10-CM

## 2014-11-09 ENCOUNTER — Encounter: Payer: Self-pay | Admitting: Gastroenterology

## 2014-11-09 ENCOUNTER — Other Ambulatory Visit: Payer: Self-pay | Admitting: Gastroenterology

## 2014-12-31 ENCOUNTER — Emergency Department (HOSPITAL_COMMUNITY): Payer: 59

## 2014-12-31 ENCOUNTER — Emergency Department (HOSPITAL_COMMUNITY)
Admission: EM | Admit: 2014-12-31 | Discharge: 2015-01-01 | Disposition: A | Discharge status: Home / Self Care | Payer: 59 | Attending: Emergency Medicine | Admitting: Emergency Medicine

## 2014-12-31 ENCOUNTER — Encounter (HOSPITAL_COMMUNITY): Payer: Self-pay

## 2014-12-31 DIAGNOSIS — Z8719 Personal history of other diseases of the digestive system: Secondary | ICD-10-CM | POA: Diagnosis not present

## 2014-12-31 DIAGNOSIS — I951 Orthostatic hypotension: Secondary | ICD-10-CM

## 2014-12-31 DIAGNOSIS — Z872 Personal history of diseases of the skin and subcutaneous tissue: Secondary | ICD-10-CM | POA: Diagnosis not present

## 2014-12-31 DIAGNOSIS — M1990 Unspecified osteoarthritis, unspecified site: Secondary | ICD-10-CM | POA: Insufficient documentation

## 2014-12-31 DIAGNOSIS — E860 Dehydration: Secondary | ICD-10-CM | POA: Diagnosis not present

## 2014-12-31 DIAGNOSIS — R42 Dizziness and giddiness: Secondary | ICD-10-CM | POA: Diagnosis present

## 2014-12-31 DIAGNOSIS — Z791 Long term (current) use of non-steroidal anti-inflammatories (NSAID): Secondary | ICD-10-CM | POA: Diagnosis not present

## 2014-12-31 DIAGNOSIS — J45901 Unspecified asthma with (acute) exacerbation: Secondary | ICD-10-CM | POA: Insufficient documentation

## 2014-12-31 DIAGNOSIS — R110 Nausea: Secondary | ICD-10-CM | POA: Insufficient documentation

## 2014-12-31 DIAGNOSIS — I10 Essential (primary) hypertension: Secondary | ICD-10-CM | POA: Diagnosis not present

## 2014-12-31 DIAGNOSIS — Z79899 Other long term (current) drug therapy: Secondary | ICD-10-CM | POA: Diagnosis not present

## 2014-12-31 DIAGNOSIS — R0602 Shortness of breath: Secondary | ICD-10-CM

## 2014-12-31 NOTE — ED Notes (Signed)
Pt arrived via EMS c/o hypotension, dizziness, nausea.  Denies any pain. Recently started training for a marathon.

## 2014-12-31 NOTE — ED Provider Notes (Signed)
CSN: [id number]     Arrival date & time 12/31/14  2253 History   First MD Initiated Contact with Patient 12/31/14 2302     Chief Complaint  Patient presents with  . Hypotension     (Consider location/radiation/quality/duration/timing/severity/associated sxs/prior Treatment) HPI Patient presents with dizziness described as lightheadedness starting around 8:30 PM. She states she got up from dinner and became lightheaded feeling like she was going to pass out. She also states she was nauseated with mild photo and phonophobia. Denies room spinning sensation. Just complains of some shortness of breath. She denies any cough. She's had no vomiting or diarrhea. Denies pain, specifically chest or abdominal pain. She's had no recent change in her medications and took them at 6 AM this morning. She states she recently started training for a marathon and runs regularly outside. Denies any blood in the stool or melanotic stools. Per EMS systolic blood pressure. Her symptoms have improved while lying flat. Denies any lower extremity swelling or pain.   Past Medical History  Diagnosis Date  . Esophageal varices   . Cirrhosis, alcoholic   . Hyponatremia   . GERD (gastroesophageal reflux disease)   . Hypertension   . Psoriasis   . Asthma   . Arthritis    Past Surgical History  Procedure Laterality Date  . Bladder surgery      Tumor removed   . Breast reduction surgery      Bilateral    Family History  Problem Relation Age of Onset  . Colon cancer Neg Hx    History  Substance Use Topics  . Smoking status: Never Smoker   . Smokeless tobacco: Never Used  . Alcohol Use: No   OB History    No data available     Review of Systems  Constitutional: Negative for fever and chills.  Respiratory: Positive for shortness of breath. Negative for cough and wheezing.   Cardiovascular: Negative for chest pain, palpitations and leg swelling.  Gastrointestinal: Positive for nausea. Negative for  vomiting, abdominal pain, diarrhea, constipation and blood in stool.  Genitourinary: Negative for dysuria, frequency, flank pain and difficulty urinating.  Musculoskeletal: Negative for back pain, neck pain and neck stiffness.  Skin: Negative for rash and wound.  Neurological: Positive for dizziness and light-headedness. Negative for syncope, weakness, numbness and headaches.  All other systems reviewed and are negative.     Allergies  Lisinopril and Sulfamethoxazole-trimethoprim  Home Medications   Prior to Admission medications   Medication Sig Start Date End Date Taking? Authorizing Provider  albuterol (PROVENTIL HFA;VENTOLIN HFA) 108 (90 BASE) MCG/ACT inhaler Inhale 2 puffs into the lungs every 6 (six) hours as needed for wheezing or shortness of breath.   Yes Historical Provider, MD  b complex vitamins tablet Take 1 tablet by mouth daily.     Yes Historical Provider, MD  diclofenac (VOLTAREN) 75 MG EC tablet Take 75 mg by mouth daily.    Yes Historical Provider, MD  furosemide (LASIX) 40 MG tablet TAKE 1 TABLET BY MOUTH ONCE DAILY 06/22/14  Yes Milus Banister, MD  nadolol (CORGARD) 20 MG tablet TAKE 1 TABLET BY MOUTH DAILY. 04/09/14  Yes Milus Banister, MD  Prenatal Multivit-Min-Fe-FA (PRE-NATAL FORMULA) TABS Take 1 tablet by mouth daily.    Yes Historical Provider, MD  spironolactone (ALDACTONE) 50 MG tablet TAKE 1 TABLET (50 MG TOTAL) BY MOUTH DAILY. 11/09/14  Yes Milus Banister, MD   BP 121/61 mmHg  Pulse 86  Temp(Src) 97.4 F (  36.3 C) (Oral)  Resp 16  Ht 5' 3.5" (1.613 m)  Wt 149 lb (67.586 kg)  BMI 25.98 kg/m2  SpO2 94% Physical Exam  Constitutional: She is oriented to person, place, and time. She appears well-developed and well-nourished. No distress.  HENT:  Head: Normocephalic and atraumatic.  Mouth/Throat: Oropharynx is clear and moist.  Eyes: EOM are normal. Pupils are equal, round, and reactive to light.  Neck: Normal range of motion. Neck supple.   Cardiovascular: Normal rate and regular rhythm.  Exam reveals no gallop and no friction rub.   No murmur heard. Pulmonary/Chest: Effort normal and breath sounds normal. No respiratory distress. She has no wheezes. She has no rales. She exhibits no tenderness.  Abdominal: Soft. Bowel sounds are normal. She exhibits no distension and no mass. There is no tenderness. There is no rebound and no guarding.  Musculoskeletal: Normal range of motion. She exhibits no edema or tenderness.  No lower extremity swelling or tenderness.  Neurological: She is alert and oriented to person, place, and time.  5/5 motor in all extremities. Sensation is fully intact.  Skin: Skin is warm and dry. No rash noted. No erythema.  Psychiatric: She has a normal mood and affect. Her behavior is normal.  Nursing note and vitals reviewed.   ED Course  Procedures (including critical care time) Labs Review Labs Reviewed  CBC WITH DIFFERENTIAL/PLATELET - Abnormal; Notable for the following:    RBC 3.76 (*)    All other components within normal limits  COMPREHENSIVE METABOLIC PANEL - Abnormal; Notable for the following:    Glucose, Bld 191 (*)    Creatinine, Ser 1.21 (*)    Calcium 8.6 (*)    Total Protein 6.3 (*)    Albumin 3.4 (*)    Total Bilirubin 1.3 (*)    GFR calc non Af Amer 48 (*)    GFR calc Af Amer 55 (*)    All other components within normal limits  URINALYSIS, ROUTINE W REFLEX MICROSCOPIC (NOT AT [facility]) - Abnormal; Notable for the following:    APPearance CLOUDY (*)    All other components within normal limits  TROPONIN I  BRAIN NATRIURETIC PEPTIDE    Imaging Review Dg Chest Port 1 View  01/01/2015   CLINICAL DATA:  Shortness of breath and asthma  EXAM: PORTABLE CHEST - 1 VIEW  COMPARISON:  Chest radiograph dated 09/09/2010  FINDINGS: The heart size and mediastinal contours are within normal limits. Both lungs are clear. The visualized skeletal structures are unremarkable.  IMPRESSION: No active  disease.   Electronically Signed   By: Anner Crete M.D.   On: 01/01/2015 00:15     EKG Interpretation   Date/Time:  Monday December 31 2014 23:07:43 EDT Ventricular Rate:  82 PR Interval:  141 QRS Duration: 92 QT Interval:  419 QTC Calculation: 489 R Axis:   84 Text Interpretation:  Sinus rhythm Right atrial enlargement Borderline  right axis deviation Borderline ST depression, diffuse leads Borderline  prolonged QT interval Confirmed by Lita Mains  MD, Charon Smedberg ([id number]) on  12/31/2014 11:18:30 PM      MDM   Final diagnoses:  SOB (shortness of breath)  Orthostatic hypotension  Dehydration   Patient states she is feeling much better after the IV fluids. Last blood pressure is 121/61. She is ambulating without any dizziness. O2 saturations 94% on room air. Think likely her hypotension is multifactorial. She states she's been running in the heat and likely is dehydration hydrated. This combined with  Lasix and beta blocker I believe this was possible for her blood pressure. She is advised to increase her fluid consumption. Also advised to follow-up with her primary physician for possible medication adjustment. Return precautions have been given.     Julianne Rice, MD 01/01/15 [phone]

## 2015-01-01 ENCOUNTER — Telehealth: Payer: Self-pay | Admitting: Gastroenterology

## 2015-01-01 LAB — BRAIN NATRIURETIC PEPTIDE: B Natriuretic Peptide: 49.7 pg/mL (ref 0.0–100.0)

## 2015-01-01 LAB — COMPREHENSIVE METABOLIC PANEL
ALBUMIN: 3.4 g/dL — AB (ref 3.5–5.0)
ALT: 30 U/L (ref 14–54)
ANION GAP: 10 (ref 5–15)
AST: 40 U/L (ref 15–41)
Alkaline Phosphatase: 89 U/L (ref 38–126)
BUN: 17 mg/dL (ref 6–20)
CO2: 24 mmol/L (ref 22–32)
Calcium: 8.6 mg/dL — ABNORMAL LOW (ref 8.9–10.3)
Chloride: 105 mmol/L (ref 101–111)
Creatinine, Ser: 1.21 mg/dL — ABNORMAL HIGH (ref 0.44–1.00)
GFR, EST AFRICAN AMERICAN: 55 mL/min — AB (ref >60)
GFR, EST NON AFRICAN AMERICAN: 48 mL/min — AB (ref >60)
Glucose, Bld: 191 mg/dL — ABNORMAL HIGH (ref 65–99)
POTASSIUM: 3.6 mmol/L (ref 3.5–5.1)
SODIUM: 139 mmol/L (ref 135–145)
TOTAL PROTEIN: 6.3 g/dL — AB (ref 6.5–8.1)
Total Bilirubin: 1.3 mg/dL — ABNORMAL HIGH (ref 0.3–1.2)

## 2015-01-01 LAB — CBC WITH DIFFERENTIAL/PLATELET
BASOS ABS: 0 10*3/uL (ref 0.0–0.1)
BASOS PCT: 0 % (ref 0–1)
EOS ABS: 0.1 10*3/uL (ref 0.0–0.7)
Eosinophils Relative: 1 % (ref 0–5)
HCT: 36.3 % (ref 36.0–46.0)
HEMOGLOBIN: 12.6 g/dL (ref 12.0–15.0)
Lymphocytes Relative: 19 % (ref 12–46)
Lymphs Abs: 1.9 10*3/uL (ref 0.7–4.0)
MCH: 33.5 pg (ref 26.0–34.0)
MCHC: 34.7 g/dL (ref 30.0–36.0)
MCV: 96.5 fL (ref 78.0–100.0)
MONOS PCT: 6 % (ref 3–12)
Monocytes Absolute: 0.6 10*3/uL (ref 0.1–1.0)
NEUTROS PCT: 74 % (ref 43–77)
Neutro Abs: 7.7 10*3/uL (ref 1.7–7.7)
PLATELETS: 204 10*3/uL (ref 150–400)
RBC: 3.76 MIL/uL — AB (ref 3.87–5.11)
RDW: 13.4 % (ref 11.5–15.5)
WBC: 10.3 10*3/uL (ref 4.0–10.5)

## 2015-01-01 LAB — TROPONIN I: Troponin I: <0.03 ng/mL (ref <0.031)

## 2015-01-01 LAB — URINALYSIS, ROUTINE W REFLEX MICROSCOPIC
Bilirubin Urine: NEGATIVE
Glucose, UA: NEGATIVE mg/dL
Hgb urine dipstick: NEGATIVE
Ketones, ur: NEGATIVE mg/dL
LEUKOCYTES UA: NEGATIVE
NITRITE: NEGATIVE
PROTEIN: NEGATIVE mg/dL
Specific Gravity, Urine: 1.018 (ref 1.005–1.030)
UROBILINOGEN UA: 1 mg/dL (ref 0.0–1.0)
pH: 6 (ref 5.0–8.0)

## 2015-01-01 MED ORDER — SODIUM CHLORIDE 0.9 % IV BOLUS (SEPSIS)
500.0000 mL | Freq: Once | INTRAVENOUS | Status: AC
  Administered 2015-01-01: 500 mL via INTRAVENOUS

## 2015-01-01 NOTE — ED Notes (Signed)
Pt attempted to use a urinal. Was unable to do so. Will try later.

## 2015-01-01 NOTE — Telephone Encounter (Signed)
Dr Ardis Hughs the pt was seen at [facility] last night for low BP, she has been training for a marathon on October.  Please advise on if she should continue Nadalol and at what dosage?

## 2015-01-01 NOTE — Discharge Instructions (Signed)
Dehydration, Adult °Dehydration is when you lose more fluids from the body than you take in. Vital organs like the kidneys, brain, and heart cannot function without a proper amount of fluids and salt. Any loss of fluids from the body can cause dehydration.  °CAUSES  °· Vomiting. °· Diarrhea. °· Excessive sweating. °· Excessive urine output. °· Fever. °SYMPTOMS  °Mild dehydration °· Thirst. °· Dry lips. °· Slightly dry mouth. °Moderate dehydration °· Very dry mouth. °· Sunken eyes. °· Skin does not bounce back quickly when lightly pinched and released. °· Dark urine and decreased urine production. °· Decreased tear production. °· Headache. °Severe dehydration °· Very dry mouth. °· Extreme thirst. °· Rapid, weak pulse (more than 100 beats per minute at rest). °· Cold hands and feet. °· Not able to sweat in spite of heat and temperature. °· Rapid breathing. °· Blue lips. °· Confusion and lethargy. °· Difficulty being awakened. °· Minimal urine production. °· No tears. °DIAGNOSIS  °Your caregiver will diagnose dehydration based on your symptoms and your exam. Blood and urine tests will help confirm the diagnosis. The diagnostic evaluation should also identify the cause of dehydration. °TREATMENT  °Treatment of mild or moderate dehydration can often be done at home by increasing the amount of fluids that you drink. It is best to drink small amounts of fluid more often. Drinking too much at one time can make vomiting worse. Refer to the home care instructions below. °Severe dehydration needs to be treated at the hospital where you will probably be given intravenous (IV) fluids that contain water and electrolytes. °HOME CARE INSTRUCTIONS  °· Ask your caregiver about specific rehydration instructions. °· Drink enough fluids to keep your urine clear or pale yellow. °· Drink small amounts frequently if you have nausea and vomiting. °· Eat as you normally do. °· Avoid: °¨ Foods or drinks high in sugar. °¨ Carbonated  drinks. °¨ Juice. °¨ Extremely hot or cold fluids. °¨ Drinks with caffeine. °¨ Fatty, greasy foods. °¨ Alcohol. °¨ Tobacco. °¨ Overeating. °¨ Gelatin desserts. °· Wash your hands well to avoid spreading bacteria and viruses. °· Only take over-the-counter or prescription medicines for pain, discomfort, or fever as directed by your caregiver. °· Ask your caregiver if you should continue all prescribed and over-the-counter medicines. °· Keep all follow-up appointments with your caregiver. °SEEK MEDICAL CARE IF: °· You have abdominal pain and it increases or stays in one area (localizes). °· You have a rash, stiff neck, or severe headache. °· You are irritable, sleepy, or difficult to awaken. °· You are weak, dizzy, or extremely thirsty. °SEEK IMMEDIATE MEDICAL CARE IF:  °· You are unable to keep fluids down or you get worse despite treatment. °· You have frequent episodes of vomiting or diarrhea. °· You have blood or green matter (bile) in your vomit. °· You have blood in your stool or your stool looks black and tarry. °· You have not urinated in 6 to 8 hours, or you have only urinated a small amount of very dark urine. °· You have a fever. °· You faint. °MAKE SURE YOU:  °· Understand these instructions. °· Will watch your condition. °· Will get help right away if you are not doing well or get worse. °Document Released: 06/22/2005 Document Revised: 09/14/2011 Document Reviewed: 02/09/2011 °ExitCare® Patient Information ©2015 [employer]. This information is not intended to replace advice given to you by your health care provider. Make sure you discuss any questions you have with your health care   provider.  Orthostatic Hypotension Orthostatic hypotension is a sudden drop in blood pressure. It happens when you quickly stand up from a seated or lying position. You may feel dizzy or light-headed. This can last for just a few seconds or for up to a few minutes. It is usually not a serious problem. However, if this  happens frequently or gets worse, it can be a sign of something more serious. CAUSES  Different things can cause orthostatic hypotension, including:   Loss of body fluids (dehydration).  Medicines that lower blood pressure.  Sudden changes in posture, such as standing up quickly after you have been sitting or lying down.  Taking too much of your medicine. SIGNS AND SYMPTOMS   Light-headedness or dizziness.   Fainting or near-fainting.   A fast heart rate.   Weakness.   Feeling tired (fatigue).  DIAGNOSIS  Your health care provider may do several things to help diagnose your condition and identify the cause. These may include:   Taking a medical history and doing a physical exam.  Checking your blood pressure. Your health care provider will check your blood pressure when you are:  Lying down.  Sitting.  Standing.  Using tilt table testing. In this test, you lie down on a table that moves from a lying position to a standing position. You will be strapped onto the table. This test monitors your blood pressure and heart rate when you are in different positions. TREATMENT  Treatment will vary depending on the cause. Possible treatments include:   Changing the dosage of your medicines.  Wearing compression stockings on your lower legs.  Standing up slowly after sitting or lying down.  Eating more salt.  Eating frequent, small meals.  In some cases, getting IV fluids.  Taking medicine to enhance fluid retention. HOME CARE INSTRUCTIONS  Only take over-the-counter or prescription medicines as directed by your health care provider.  Follow your health care provider's instructions for changing the dosage of your current medicines.  Do not stop or adjust your medicine on your own.  Stand up slowly after sitting or lying down. This allows your body to adjust to the different position.  Wear compression stockings as directed.  Eat extra salt as directed.  Do  not add extra salt to your diet unless directed to by your health care provider.  Eat frequent, small meals.  Avoid standing suddenly after eating.  Avoid hot showers or excessive heat as directed by your health care provider.  Keep all follow-up appointments. SEEK MEDICAL CARE IF:  You continue to feel dizzy or light-headed after standing.  You feel groggy or confused.  You feel cold, clammy, or sick to your stomach (nauseous).  You have blurred vision.  You feel short of breath. SEEK IMMEDIATE MEDICAL CARE IF:   You faint after standing.  You have chest pain.  You have difficulty breathing.   You lose feeling or movement in your arms or legs.   You have slurred speech or difficulty talking, or you are unable to talk.  MAKE SURE YOU:   Understand these instructions.  Will watch your condition.  Will get help right away if you are not doing well or get worse. Document Released: 06/12/2002 Document Revised: 06/27/2013 Document Reviewed: 04/14/2013 [facility] Patient Information 2015 [place], [place]. This information is not intended to replace advice given to you by your health care provider. Make sure you discuss any questions you have with your health care provider.

## 2015-01-01 NOTE — ED Notes (Signed)
Pt ambulated on the hallway with steady gait, no c/o dizziness, O2 saturation 94% on RA.

## 2015-01-01 NOTE — Telephone Encounter (Signed)
Left message on machine to call back  

## 2015-01-04 NOTE — Telephone Encounter (Signed)
Last EGD was 5 years ago, NO varices.  I think it is OK that she just stop the nadolol.  She is really on a low dose anyway.   Thanks

## 2015-01-04 NOTE — Telephone Encounter (Signed)
Pt has been notified.

## 2015-01-10 ENCOUNTER — Telehealth: Payer: Self-pay

## 2015-01-10 DIAGNOSIS — K7030 Alcoholic cirrhosis of liver without ascites: Secondary | ICD-10-CM

## 2015-01-10 NOTE — Telephone Encounter (Signed)
-----   Message from Boody sent at 07/12/2014 11:36 AM EST ----- Needs repeat US and AFP level in 6 months,

## 2015-01-11 NOTE — Telephone Encounter (Signed)
Left message on machine to call back  

## 2015-01-14 NOTE — Telephone Encounter (Signed)
[place] at [facility] early morning apt   You have been scheduled for an abdominal ultrasound at [facility] entrance A on 01/18/15 at 7 am. Please arrive 15 minutes prior to your appointment for registration. Make certain not to have anything to eat or drink 6 hours prior to your appointment. Should you need to reschedule your appointment, please contact radiology at [phone]. This test typically takes about 30 minutes to perform.   Pt has been notified to have labs and [place] she was instructed

## 2015-01-16 ENCOUNTER — Other Ambulatory Visit: Payer: 59

## 2015-01-16 DIAGNOSIS — K7030 Alcoholic cirrhosis of liver without ascites: Secondary | ICD-10-CM

## 2015-01-17 LAB — AFP TUMOR MARKER: AFP-Tumor Marker: 4.3 ng/mL (ref <6.1)

## 2015-01-18 ENCOUNTER — Ambulatory Visit (HOSPITAL_COMMUNITY)
Admission: RE | Admit: 2015-01-18 | Discharge: 2015-01-18 | Disposition: A | Discharge status: Home / Self Care | Payer: 59 | Source: Ambulatory Visit | Attending: Gastroenterology | Admitting: Gastroenterology

## 2015-01-18 DIAGNOSIS — K7030 Alcoholic cirrhosis of liver without ascites: Secondary | ICD-10-CM | POA: Diagnosis not present

## 2015-03-15 ENCOUNTER — Ambulatory Visit (INDEPENDENT_AMBULATORY_CARE_PROVIDER_SITE_OTHER): Payer: 59 | Admitting: Ophthalmology

## 2015-03-15 DIAGNOSIS — H43813 Vitreous degeneration, bilateral: Secondary | ICD-10-CM

## 2015-03-15 DIAGNOSIS — H3553 Other dystrophies primarily involving the sensory retina: Secondary | ICD-10-CM

## 2015-06-11 ENCOUNTER — Telehealth: Payer: Self-pay

## 2015-06-11 NOTE — Telephone Encounter (Signed)
-----   Message from Greggory Keen, LPN sent at [id number] 10:46 AM EST ----- She needs a follow up appointment for her yearly check. She is a friend or I would have done it for you.  She wanted you to set her up.

## 2015-06-11 NOTE — Telephone Encounter (Signed)
Pt has been scheduled for 08/19/14 she is aware and will call with any concerns prior to the appt.

## 2015-08-12 ENCOUNTER — Other Ambulatory Visit: Payer: Self-pay

## 2015-08-12 DIAGNOSIS — Z1231 Encounter for screening mammogram for malignant neoplasm of breast: Secondary | ICD-10-CM

## 2015-08-12 MED FILL — SPIRONOLACTONE 50 MG TABLET: 50 | 90 days supply | Qty: 90 | Fill #3

## 2015-08-20 ENCOUNTER — Encounter: Payer: Self-pay | Admitting: Gastroenterology

## 2015-08-20 ENCOUNTER — Ambulatory Visit: Payer: 59 | Admitting: Gastroenterology

## 2015-08-20 ENCOUNTER — Ambulatory Visit (INDEPENDENT_AMBULATORY_CARE_PROVIDER_SITE_OTHER): Payer: 59 | Admitting: Gastroenterology

## 2015-08-20 ENCOUNTER — Other Ambulatory Visit (INDEPENDENT_AMBULATORY_CARE_PROVIDER_SITE_OTHER): Payer: 59

## 2015-08-20 VITALS — BP 142/72 | HR 72 | Ht 63.0 in | Wt 152.0 lb

## 2015-08-20 DIAGNOSIS — K7460 Unspecified cirrhosis of liver: Secondary | ICD-10-CM | POA: Diagnosis not present

## 2015-08-20 DIAGNOSIS — C220 Liver cell carcinoma: Secondary | ICD-10-CM | POA: Diagnosis not present

## 2015-08-20 LAB — CBC WITH DIFFERENTIAL/PLATELET
BASOS PCT: 0.4 % (ref 0.0–3.0)
Basophils Absolute: 0 10*3/uL (ref 0.0–0.1)
EOS ABS: 0.1 10*3/uL (ref 0.0–0.7)
EOS PCT: 1.6 % (ref 0.0–5.0)
HCT: 43.5 % (ref 36.0–46.0)
Hemoglobin: 15.2 g/dL — ABNORMAL HIGH (ref 12.0–15.0)
Lymphocytes Relative: 26.5 % (ref 12.0–46.0)
Lymphs Abs: 2.5 10*3/uL (ref 0.7–4.0)
MCHC: 35 g/dL (ref 30.0–36.0)
MCV: 94.5 fl (ref 78.0–100.0)
MONO ABS: 0.7 10*3/uL (ref 0.1–1.0)
Monocytes Relative: 7.8 % (ref 3.0–12.0)
NEUTROS PCT: 63.7 % (ref 43.0–77.0)
Neutro Abs: 5.9 10*3/uL (ref 1.4–7.7)
Platelets: 264 10*3/uL (ref 150.0–400.0)
RBC: 4.61 Mil/uL (ref 3.87–5.11)
RDW: 14 % (ref 11.5–15.5)
WBC: 9.3 10*3/uL (ref 4.0–10.5)

## 2015-08-20 LAB — COMPREHENSIVE METABOLIC PANEL
ALBUMIN: 4.6 g/dL (ref 3.5–5.2)
ALT: 24 U/L (ref 0–35)
AST: 31 U/L (ref 0–37)
Alkaline Phosphatase: 122 U/L — ABNORMAL HIGH (ref 39–117)
BUN: 13 mg/dL (ref 6–23)
CO2: 33 mEq/L — ABNORMAL HIGH (ref 19–32)
Calcium: 10 mg/dL (ref 8.4–10.5)
Chloride: 98 mEq/L (ref 96–112)
Creatinine, Ser: 0.78 mg/dL (ref 0.40–1.20)
GFR: 79.87 mL/min (ref >60.00)
GLUCOSE: 99 mg/dL (ref 70–99)
POTASSIUM: 3.9 meq/L (ref 3.5–5.1)
SODIUM: 140 meq/L (ref 135–145)
Total Bilirubin: 1.5 mg/dL — ABNORMAL HIGH (ref 0.2–1.2)
Total Protein: 8 g/dL (ref 6.0–8.3)

## 2015-08-20 LAB — PROTIME-INR
INR: 1.2 ratio — AB (ref 0.8–1.0)
Prothrombin Time: 13.1 s (ref 9.6–13.1)

## 2015-08-20 NOTE — Progress Notes (Signed)
Review of pertinent gastrointestinal problems:  1. Alcoholic cirrhosis, extensive, long history of drinking, presented winter, 2008 with jaundice and ascites. Hospitalized April, 2008 for several days. Her most recent imaging was an ultrasound April, 2007 showing an echogenic nodular liver consistent with cirrhosis, no obvious masses. Ascites. In June 2008, difficult to control electrolytes and ascites. Renal consultation with Dr. Marval Regal. The patient is currently on Lasix 80 mg t.i.d. and very low dose aldactone 25 mg once daily with poor control of ascites. Early July 2008, large volume paracentesis with 9 liters of ascites removed (with albumin infusion). Chronic liver disease workup hepatitis A antibody positive (old exposure). Hepatitis B negative, hepatitis C negative; alpha 1 antitrypsin ceruloplasmin, ANA, AMA, antismooth muscle antibody all normal. Iron tests essentially normal. 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 Started on nadolol 20 mg daily. Summer, 2009 modifying her diuretic regimen to a more Aldactone based regimen.   Most recent EGD: sept 2011: normal EGD  Was seen at [facility], Dr. Zollie Scale, for pre-transplant workup. Was bumped off transplant list due to bladder tumor diagnosis in 2012.   Most recent MELD UNOS 10, 05/2013 labs   Most recent AFP normal, 01/2015  Most recent liver imaging 01/2015 US showed no discrete liver lesions, + cirrhosis  Hep A immune, immunized for Hep B through work (works at [employer]).  2. Routine risk for colon cancer: 04/2007 colonoscopy found no poylps.  Recommended recall at 10 years.  HPI: This is a   very pleasant 60 year old woman whom I last saw about a year ago  Chief complaint is  cirrhosis  Ran first marathon this past fall and she completed.  [place] in [place].  Going to run more.  She stopped nadolol after a dehydration issue over this past.    She is otherwise feeling very well. No troubles with edema. No overt GI bleeding. Her weight has been stable     Past Medical History  Diagnosis Date  . Esophageal varices ([place])   . Cirrhosis, alcoholic ([place])   . Hyponatremia   . GERD (gastroesophageal reflux disease)   . Hypertension   . Psoriasis   . Asthma   . Arthritis     Past Surgical History  Procedure Laterality Date  . Bladder surgery      Tumor removed   . Breast reduction surgery      Bilateral     Current Outpatient Prescriptions  Medication Sig Dispense Refill  . albuterol (PROVENTIL HFA;VENTOLIN HFA) 108 (90 BASE) MCG/ACT inhaler Inhale 2 puffs into the lungs every 6 (six) hours as needed for wheezing or shortness of breath.    Marland Kitchen b complex vitamins tablet Take 1 tablet by mouth daily.      . diclofenac (VOLTAREN) 75 MG EC tablet Take 75 mg by mouth daily.     . furosemide (LASIX) 40 MG tablet TAKE 1 TABLET BY MOUTH ONCE DAILY 30 tablet PRN  . Prenatal Multivit-Min-Fe-FA (PRE-NATAL FORMULA) TABS Take 1 tablet by mouth daily.     Marland Kitchen spironolactone (ALDACTONE) 50 MG tablet TAKE 1 TABLET (50 MG TOTAL) BY MOUTH DAILY. 30 tablet PRN   No current facility-administered medications for this visit.    Allergies as of 08/20/2015 - Review Complete 08/20/2015  Allergen Reaction Noted  . Lisinopril  12/07/2006  . Sulfamethoxazole-trimethoprim  12/07/2006    Family History  Problem Relation Age of Onset  . Colon cancer Neg Hx  Social History   Social History  . Marital Status: Married    Spouse Name: N/A  . Number of Children: N/A  . Years of Education: N/A   Occupational History  . Not on file.   Social History Main Topics  . Smoking status: Never Smoker   . Smokeless tobacco: Never Used  . Alcohol Use: No  . Drug Use: No  . Sexual Activity: Not on file   Other Topics Concern  . Not on file   Social History Narrative     Physical Exam: BP 152/90 mmHg  Pulse 72  Ht 5\' 3"  (1.6 m)  Wt  152 lb (68.947 kg)  BMI 26.93 kg/m2 Constitutional: generally well-appearing Psychiatric: alert and oriented x3 Abdomen: soft, nontender, nondistended, no obvious ascites, no peritoneal signs, normal bowel sounds   Assessment and plan: 60 y.o. female with well compensated cirrhosis  we will restage her liver disease with a set of blood tests including a CBC, complete about profile and coags. She needs screening for hepatoma with ultrasound. Alpha fetoproteins are really no longer in the algorithm for hepatoma screening.  She has not had upper endoscopy in about 7 years and also stopped nadolol this past summer after she had a near syncopal episode due to dehydration when she was training a lot for marathon. I recommended we recheck her esophagus for the development of varices, portal gastropathy.   Owens Loffler, MD [facility] 08/20/2015, 9:53 AM

## 2015-08-20 NOTE — Patient Instructions (Addendum)
You will have labs checked today in the basement lab.  Please head down after you check out with the front desk  (cbc, cmet, inr) You will be set up for an ultrasound for cirrhosis, screening for hepatoma.   You have been scheduled for an abdominal ultrasound at [facility] Radiology Entrance A on 08/23/15  at 7 am. Please arrive 15 minutes prior to your appointment for registration. Make certain not to have anything to eat or drink 6 hours prior to your appointment. Should you need to reschedule your appointment, please contact radiology at [phone]. This test typically takes about 30 minutes to perform. You will be set up for an upper endoscopy for screening for varices ([place]). It is important that you have a relatively low salt diet.  High salt diet can cause fluid to accumulate in your legs, abdomen and even around your lungs. You should try to avoid NSAID type over the counter pain medicines as best as possible. Tylenol is safe to take for 'routine' aches and pains, but never take more than 1/2 the dose suggested on the package instructions (never more than 2 grams per day). Avoid alcohol (as you do already)

## 2015-08-22 ENCOUNTER — Ambulatory Visit (HOSPITAL_COMMUNITY): Payer: 59

## 2015-08-23 ENCOUNTER — Ambulatory Visit (HOSPITAL_COMMUNITY)
Admission: RE | Admit: 2015-08-23 | Discharge: 2015-08-23 | Disposition: A | Discharge status: Home / Self Care | Payer: 59 | Source: Ambulatory Visit | Attending: Gastroenterology | Admitting: Gastroenterology

## 2015-08-23 DIAGNOSIS — K7460 Unspecified cirrhosis of liver: Secondary | ICD-10-CM | POA: Insufficient documentation

## 2015-08-23 DIAGNOSIS — C220 Liver cell carcinoma: Secondary | ICD-10-CM | POA: Diagnosis not present

## 2015-08-26 ENCOUNTER — Ambulatory Visit (AMBULATORY_SURGERY_CENTER): Payer: 59 | Admitting: Gastroenterology

## 2015-08-26 ENCOUNTER — Encounter: Payer: Self-pay | Admitting: Gastroenterology

## 2015-08-26 VITALS — BP 134/88 | HR 60 | Temp 97.7°F | Resp 12 | Ht 63.0 in | Wt 152.0 lb

## 2015-08-26 DIAGNOSIS — E669 Obesity, unspecified: Secondary | ICD-10-CM | POA: Diagnosis not present

## 2015-08-26 DIAGNOSIS — J45909 Unspecified asthma, uncomplicated: Secondary | ICD-10-CM | POA: Diagnosis not present

## 2015-08-26 DIAGNOSIS — K7460 Unspecified cirrhosis of liver: Secondary | ICD-10-CM

## 2015-08-26 DIAGNOSIS — I10 Essential (primary) hypertension: Secondary | ICD-10-CM | POA: Diagnosis not present

## 2015-08-26 MED ORDER — SODIUM CHLORIDE 0.9 % IV SOLN: 500.0000 mL | INTRAVENOUS | Status: DC

## 2015-08-26 NOTE — Progress Notes (Signed)
Report to PACU, RN, vss, BBS= Clear.  

## 2015-08-26 NOTE — Patient Instructions (Signed)
YOU HAD AN ENDOSCOPIC PROCEDURE TODAY AT THE [facility]:   Refer to the procedure report that was given to you for any specific questions about what was found during the examination.  If the procedure report does not answer your questions, please call your gastroenterologist to clarify.  If you requested that your care partner not be given the details of your procedure findings, then the procedure report has been included in a sealed envelope for you to review at your convenience later.  YOU SHOULD EXPECT: Some feelings of bloating in the abdomen. Passage of more gas than usual.  Walking can help get rid of the air that was put into your GI tract during the procedure and reduce the bloating. If you had a lower endoscopy (such as a colonoscopy or flexible sigmoidoscopy) you may notice spotting of blood in your stool or on the toilet paper. If you underwent a bowel prep for your procedure, you may not have a normal bowel movement for a few days.  Please Note:  You might notice some irritation and congestion in your nose or some drainage.  This is from the oxygen used during your procedure.  There is no need for concern and it should clear up in a day or so.  SYMPTOMS TO REPORT IMMEDIATELY:   Following upper endoscopy (EGD)  Vomiting of blood or coffee ground material  New chest pain or pain under the shoulder blades  Painful or persistently difficult swallowing  New shortness of breath  Fever of 100F or higher  Black, tarry-looking stools  For urgent or emergent issues, a gastroenterologist can be reached at any hour by calling [phone].   DIET: Your first meal following the procedure should be a small meal and then it is ok to progress to your normal diet. Heavy or fried foods are harder to digest and may make you feel nauseous or bloated.  Likewise, meals heavy in dairy and vegetables can increase bloating.  Drink plenty of fluids but you should avoid alcoholic beverages for  24 hours.  ACTIVITY:  You should plan to take it easy for the rest of today and you should NOT DRIVE or use heavy machinery until tomorrow (because of the sedation medicines used during the test).    FOLLOW UP: Our staff will call the number listed on your records the next business day following your procedure to check on you and address any questions or concerns that you may have regarding the information given to you following your procedure. If we do not reach you, we will leave a message.  However, if you are feeling well and you are not experiencing any problems, there is no need to return our call.  We will assume that you have returned to your regular daily activities without incident.  If any biopsies were taken you will be contacted by phone or by letter within the next 1-3 weeks.  Please call us at [phone] if you have not heard about the biopsies in 3 weeks.    SIGNATURES/CONFIDENTIALITY: You and/or your care partner have signed paperwork which will be entered into your electronic medical record.  These signatures attest to the fact that that the information above on your After Visit Summary has been reviewed and is understood.  Full responsibility of the confidentiality of this discharge information lies with you and/or your care-partner.  Thank you for letting us take care of your healthcare needs. 

## 2015-08-26 NOTE — Op Note (Signed)
[facility]  [address] [place], [address]   ENDOSCOPY PROCEDURE REPORT  PATIENT: [name], [name]  MR#: [id number] BIRTHDATE: Nov 08, 1955 , 59  yrs. old GENDER: female ENDOSCOPIST: Milus Banister, MD PROCEDURE DATE:  08/26/2015 PROCEDURE:  EGD, diagnostic ASA CLASS:     Class III INDICATIONS:  cirrhosis, variceal screening.  Last EGD was in 2011, no varices or portal gastropathy. MEDICATIONS: Monitored anesthesia care and Propofol 150 mg IV TOPICAL ANESTHETIC: none  DESCRIPTION OF PROCEDURE: After the risks benefits and alternatives of the procedure were thoroughly explained, informed consent was obtained.  The LB [id number] endoscope was introduced through the mouth and advanced to the second portion of the duodenum , Without limitations.  The instrument was slowly withdrawn as the mucosa was fully examined.   EXAM: The esophagus and gastroesophageal junction were completely normal in appearance.  The stomach was entered and closely examined.The antrum, angularis, and lesser curvature were well visualized, including a retroflexed view of the cardia and fundus. The stomach wall was normally distensable.  The scope passed easily through the pylorus into the duodenum.  Retroflexed views revealed no abnormalities.     The scope was then withdrawn from the patient and the procedure completed.  COMPLICATIONS: There were no immediate complications.  ENDOSCOPIC IMPRESSION: Normal appearing esophagus and GE junction, the stomach was well visualized and normal in appearance, normal appearing duodenum; no gastric or esophageal varices. No portal hypertensive gastropathy.   RECOMMENDATIONS: Follow clinically.  Will likely repeat EGD in 2-3 years.  eSigned:  Milus Banister, MD 08/26/2015 2:50 PM

## 2015-08-27 ENCOUNTER — Telehealth: Payer: Self-pay | Admitting: *Deleted

## 2015-08-27 NOTE — Telephone Encounter (Signed)
No answer, left message to call if questions or concerns. 

## 2015-08-27 NOTE — Telephone Encounter (Signed)
No answer. Left message to call if questions or concerns. 

## 2015-09-02 MED FILL — DICLOFENAC SOD EC 75 MG TAB: 75 | 30 days supply | Qty: 60 | Fill #1

## 2015-09-18 ENCOUNTER — Other Ambulatory Visit: Payer: Self-pay | Admitting: Gastroenterology

## 2015-09-18 MED FILL — FUROSEMIDE 40 MG TABLET: 40 | 90 days supply | Qty: 90 | Fill #0

## 2015-09-23 ENCOUNTER — Ambulatory Visit
Admission: RE | Admit: 2015-09-23 | Discharge: 2015-09-23 | Disposition: A | Discharge status: Home / Self Care | Payer: 59 | Source: Ambulatory Visit

## 2015-09-23 DIAGNOSIS — Z1231 Encounter for screening mammogram for malignant neoplasm of breast: Secondary | ICD-10-CM

## 2015-10-01 MED FILL — AMOXICILLIN 500 MG CAPSULE: 500 | 7 days supply | Qty: 28 | Fill #0

## 2015-10-01 MED FILL — VENTOLIN HFA 90 MCG INHALER: 108 (90 BAS | 16 days supply | Qty: 18 | Fill #1

## 2015-10-28 MED FILL — DICLOFENAC SOD EC 75 MG TAB: 75 | 30 days supply | Qty: 60 | Fill #2

## 2015-11-01 DIAGNOSIS — G4489 Other headache syndrome: Secondary | ICD-10-CM | POA: Diagnosis not present

## 2015-11-01 MED FILL — valACYclovir HCL 1 GM TABS: 1 | 7 days supply | Qty: 21 | Fill #0

## 2015-11-12 DIAGNOSIS — K7030 Alcoholic cirrhosis of liver without ascites: Secondary | ICD-10-CM | POA: Diagnosis not present

## 2015-11-12 DIAGNOSIS — Z0000 Encounter for general adult medical examination without abnormal findings: Secondary | ICD-10-CM | POA: Diagnosis not present

## 2015-11-12 DIAGNOSIS — M8588 Other specified disorders of bone density and structure, other site: Secondary | ICD-10-CM | POA: Diagnosis not present

## 2015-11-12 DIAGNOSIS — R030 Elevated blood-pressure reading, without diagnosis of hypertension: Secondary | ICD-10-CM | POA: Diagnosis not present

## 2015-11-14 ENCOUNTER — Other Ambulatory Visit: Payer: Self-pay | Admitting: Gastroenterology

## 2015-11-14 MED FILL — SPIRONOLACTONE 50 MG TABLET: 50 | 90 days supply | Qty: 90 | Fill #0

## 2015-12-16 MED FILL — FUROSEMIDE 40 MG TABLET: 40 | 90 days supply | Qty: 90 | Fill #1

## 2015-12-23 DIAGNOSIS — I10 Essential (primary) hypertension: Secondary | ICD-10-CM | POA: Diagnosis not present

## 2015-12-23 MED FILL — AMLODIPINE BESYLATE 5 MG TA: 5 | 90 days supply | Qty: 90 | Fill #0

## 2015-12-27 DIAGNOSIS — Z8551 Personal history of malignant neoplasm of bladder: Secondary | ICD-10-CM | POA: Diagnosis not present

## 2016-01-20 DIAGNOSIS — I10 Essential (primary) hypertension: Secondary | ICD-10-CM | POA: Diagnosis not present

## 2016-02-11 MED FILL — SPIRONOLACTONE 50 MG TABLET: 50 | 90 days supply | Qty: 90 | Fill #1

## 2016-02-24 ENCOUNTER — Telehealth: Payer: Self-pay

## 2016-02-24 DIAGNOSIS — K7460 Unspecified cirrhosis of liver: Secondary | ICD-10-CM

## 2016-02-24 NOTE — Telephone Encounter (Signed)
You have been scheduled for an abdominal ultrasound at [facility] (1st floor of hospital) on 03/02/16 at 645 am. Please arrive 15 minutes prior to your appointment for registration. Make certain not to have anything to eat or drink 6 hours prior to your appointment. Should you need to reschedule your appointment, please contact radiology at [phone]. This test typically takes about 30 minutes to perform.  AFP in EPIC the pt will come in and have that this week.   The pt has been instructed and the pt verbalized understanding.

## 2016-02-24 NOTE — Telephone Encounter (Signed)
-----   Message from Milus Banister, MD sent at 02/23/2016  8:07 AM EDT ----- She needs repeat US and AFP in next few weeks.  Thanks    ----- Message ----- From: Barron Alvine, RN Sent: 02/23/2016 To: Milus Banister, MD  repeat US in 6 months.

## 2016-02-25 MED FILL — DICLOFENAC SOD EC 75 MG TAB: 75 | 84 days supply | Qty: 36 | Fill #0

## 2016-02-28 ENCOUNTER — Other Ambulatory Visit: Payer: 59

## 2016-02-28 DIAGNOSIS — K7460 Unspecified cirrhosis of liver: Secondary | ICD-10-CM

## 2016-02-29 LAB — AFP TUMOR MARKER: AFP-Tumor Marker: 5.1 ng/mL (ref <6.1)

## 2016-03-02 ENCOUNTER — Ambulatory Visit (HOSPITAL_COMMUNITY)
Admission: RE | Admit: 2016-03-02 | Discharge: 2016-03-02 | Disposition: A | Discharge status: Home / Self Care | Payer: 59 | Source: Ambulatory Visit | Attending: Gastroenterology | Admitting: Gastroenterology

## 2016-03-02 DIAGNOSIS — K7460 Unspecified cirrhosis of liver: Secondary | ICD-10-CM | POA: Insufficient documentation

## 2016-03-10 MED FILL — VENTOLIN HFA 90 MCG INHALER: 108 (90 BAS | 16 days supply | Qty: 18 | Fill #2

## 2016-03-16 ENCOUNTER — Ambulatory Visit (INDEPENDENT_AMBULATORY_CARE_PROVIDER_SITE_OTHER): Payer: 59 | Admitting: Ophthalmology

## 2016-03-16 DIAGNOSIS — H43813 Vitreous degeneration, bilateral: Secondary | ICD-10-CM

## 2016-03-16 DIAGNOSIS — H2513 Age-related nuclear cataract, bilateral: Secondary | ICD-10-CM | POA: Diagnosis not present

## 2016-03-23 MED FILL — FUROSEMIDE 40 MG TABLET: 40 | 30 days supply | Qty: 30 | Fill #2

## 2016-03-23 MED FILL — AMLODIPINE BESYLATE 5 MG TA: 5 | 90 days supply | Qty: 90 | Fill #1

## 2016-03-27 DIAGNOSIS — D2271 Melanocytic nevi of right lower limb, including hip: Secondary | ICD-10-CM | POA: Diagnosis not present

## 2016-03-27 DIAGNOSIS — L918 Other hypertrophic disorders of the skin: Secondary | ICD-10-CM | POA: Diagnosis not present

## 2016-03-27 DIAGNOSIS — Z85828 Personal history of other malignant neoplasm of skin: Secondary | ICD-10-CM | POA: Diagnosis not present

## 2016-03-27 DIAGNOSIS — L821 Other seborrheic keratosis: Secondary | ICD-10-CM | POA: Diagnosis not present

## 2016-03-27 DIAGNOSIS — D225 Melanocytic nevi of trunk: Secondary | ICD-10-CM | POA: Diagnosis not present

## 2016-03-27 DIAGNOSIS — D2221 Melanocytic nevi of right ear and external auricular canal: Secondary | ICD-10-CM | POA: Diagnosis not present

## 2016-04-14 MED FILL — FUROSEMIDE 40 MG TABLET: 40 | 30 days supply | Qty: 30 | Fill #3

## 2016-05-11 MED FILL — DICLOFENAC SOD 75 MG TAB EC: 75 | 84 days supply | Qty: 36 | Fill #1

## 2016-05-11 MED FILL — SPIRONOLACTONE 50 MG TABLET: 50 | 90 days supply | Qty: 90 | Fill #2

## 2016-05-11 MED FILL — FUROSEMIDE 40 MG TABLET: 40 | 30 days supply | Qty: 30 | Fill #4

## 2016-06-15 MED FILL — FUROSEMIDE 40 MG TABLET: 40 | 90 days supply | Qty: 90 | Fill #5

## 2016-06-15 MED FILL — AMLODIPINE BESYLATE 5 MG TA: 5 | 90 days supply | Qty: 90 | Fill #2

## 2016-06-19 MED FILL — VENTOLIN HFA 90 MCG INHALER: 108 (90 BAS | 25 days supply | Qty: 18 | Fill #0

## 2016-08-05 DIAGNOSIS — I10 Essential (primary) hypertension: Secondary | ICD-10-CM | POA: Diagnosis not present

## 2016-08-06 ENCOUNTER — Telehealth: Payer: Self-pay

## 2016-08-06 DIAGNOSIS — K7460 Unspecified cirrhosis of liver: Secondary | ICD-10-CM

## 2016-08-06 MED FILL — DICLOFENAC SOD 75 MG TAB EC: 75 | 84 days supply | Qty: 36 | Fill #2

## 2016-08-06 MED FILL — SPIRONOLACTONE 50 MG TABLET: 50 | 90 days supply | Qty: 90 | Fill #3

## 2016-08-06 NOTE — Telephone Encounter (Signed)
-----   Message from Algernon Huxley, RN sent at 03/03/2016  4:07 PM EDT ----- Regarding: AFP and [place] Pt needs AFP and [place] in 6 mth and OV with Ardis Hughs shortly after the lab and [place]

## 2016-08-07 NOTE — Telephone Encounter (Signed)
Left message on machine to call back  09/15/16 10 am follow up appt labs in EPIC  [place] order in EPIC (need to schedule)

## 2016-08-10 NOTE — Telephone Encounter (Signed)
You have been scheduled for an abdominal ultrasound at [facility] (1st floor of hospital) on 08/19/16 at 7 am. Please arrive 15 minutes prior to your appointment for registration. Make certain not to have anything to eat or drink 6 hours prior to your appointment. Should you need to reschedule your appointment, please contact radiology at [phone]. This test typically takes about 30 minutes to perform.

## 2016-08-10 NOTE — Telephone Encounter (Signed)
Pt has been advised of labs, f/u and [place].  She will call with any questions or concerns.  Information sent via My Chart per pt request      You have been scheduled for an abdominal ultrasound at [facility] (1st floor of hospital) on 08/19/16 at 7 am. Please arrive 15 minutes prior to your appointment for registration. Make certain not to have anything to eat or drink 6 hours prior to your appointment. Should you need to reschedule your appointment, please contact radiology at [phone]. This test typically takes about 30 minutes to perform.

## 2016-08-18 ENCOUNTER — Other Ambulatory Visit: Payer: 59

## 2016-08-18 DIAGNOSIS — K7460 Unspecified cirrhosis of liver: Secondary | ICD-10-CM

## 2016-08-19 ENCOUNTER — Ambulatory Visit (HOSPITAL_COMMUNITY)
Admission: RE | Admit: 2016-08-19 | Discharge: 2016-08-19 | Disposition: A | Discharge status: Home / Self Care | Payer: 59 | Source: Ambulatory Visit | Attending: Gastroenterology | Admitting: Gastroenterology

## 2016-08-19 DIAGNOSIS — K7460 Unspecified cirrhosis of liver: Secondary | ICD-10-CM | POA: Diagnosis not present

## 2016-08-19 LAB — AFP TUMOR MARKER: AFP TUMOR MARKER: 5.7 ng/mL (ref <6.1)

## 2016-08-24 ENCOUNTER — Other Ambulatory Visit: Payer: Self-pay | Admitting: Obstetrics and Gynecology

## 2016-08-24 DIAGNOSIS — Z1231 Encounter for screening mammogram for malignant neoplasm of breast: Secondary | ICD-10-CM

## 2016-09-15 ENCOUNTER — Encounter: Payer: Self-pay | Admitting: Gastroenterology

## 2016-09-15 ENCOUNTER — Ambulatory Visit (INDEPENDENT_AMBULATORY_CARE_PROVIDER_SITE_OTHER): Payer: 59 | Admitting: Gastroenterology

## 2016-09-15 ENCOUNTER — Other Ambulatory Visit (INDEPENDENT_AMBULATORY_CARE_PROVIDER_SITE_OTHER): Payer: 59

## 2016-09-15 VITALS — BP 128/82 | HR 76 | Ht 62.75 in | Wt 156.1 lb

## 2016-09-15 DIAGNOSIS — K7460 Unspecified cirrhosis of liver: Secondary | ICD-10-CM | POA: Diagnosis not present

## 2016-09-15 DIAGNOSIS — K7030 Alcoholic cirrhosis of liver without ascites: Secondary | ICD-10-CM | POA: Diagnosis not present

## 2016-09-15 LAB — COMPREHENSIVE METABOLIC PANEL
ALBUMIN: 4.4 g/dL (ref 3.5–5.2)
ALT: 36 U/L — AB (ref 0–35)
AST: 34 U/L (ref 0–37)
Alkaline Phosphatase: 131 U/L — ABNORMAL HIGH (ref 39–117)
BUN: 15 mg/dL (ref 6–23)
CALCIUM: 10.3 mg/dL (ref 8.4–10.5)
CHLORIDE: 98 meq/L (ref 96–112)
CO2: 32 meq/L (ref 19–32)
Creatinine, Ser: 0.79 mg/dL (ref 0.40–1.20)
GFR: 78.43 mL/min (ref >60.00)
Glucose, Bld: 92 mg/dL (ref 70–99)
Potassium: 4 mEq/L (ref 3.5–5.1)
SODIUM: 139 meq/L (ref 135–145)
Total Bilirubin: 1.7 mg/dL — ABNORMAL HIGH (ref 0.2–1.2)
Total Protein: 7.9 g/dL (ref 6.0–8.3)

## 2016-09-15 LAB — CBC WITH DIFFERENTIAL/PLATELET
Basophils Absolute: 0.1 10*3/uL (ref 0.0–0.1)
Basophils Relative: 1 % (ref 0.0–3.0)
EOS ABS: 0.2 10*3/uL (ref 0.0–0.7)
Eosinophils Relative: 2.6 % (ref 0.0–5.0)
HEMATOCRIT: 45.7 % (ref 36.0–46.0)
HEMOGLOBIN: 15.7 g/dL — AB (ref 12.0–15.0)
LYMPHS PCT: 26.1 % (ref 12.0–46.0)
Lymphs Abs: 2.4 10*3/uL (ref 0.7–4.0)
MCHC: 34.4 g/dL (ref 30.0–36.0)
MCV: 96 fl (ref 78.0–100.0)
MONO ABS: 0.7 10*3/uL (ref 0.1–1.0)
Monocytes Relative: 8.1 % (ref 3.0–12.0)
Neutro Abs: 5.7 10*3/uL (ref 1.4–7.7)
Neutrophils Relative %: 62.2 % (ref 43.0–77.0)
Platelets: 283 10*3/uL (ref 150.0–400.0)
RBC: 4.76 Mil/uL (ref 3.87–5.11)
RDW: 13.4 % (ref 11.5–15.5)
WBC: 9.2 10*3/uL (ref 4.0–10.5)

## 2016-09-15 LAB — PROTIME-INR
INR: 1.3 ratio — AB (ref 0.8–1.0)
PROTHROMBIN TIME: 13.3 s — AB (ref 9.6–13.1)

## 2016-09-15 MED FILL — FUROSEMIDE 40 MG TABLET: 40 | 90 days supply | Qty: 90 | Fill #6

## 2016-09-15 MED FILL — AMLODIPINE BESYLATE 5 MG TA: 5 | 90 days supply | Qty: 90 | Fill #3

## 2016-09-15 NOTE — Progress Notes (Signed)
Review of pertinent gastrointestinal problems:  1. Alcoholic cirrhosis, extensive, long history of drinking, presented winter, 2008 with jaundice and ascites. Hospitalized April, 2008 for several days. Her most recent imaging was an ultrasound April, 2007 showing an echogenic nodular liver consistent with cirrhosis, no obvious masses. Ascites. In June 2008, difficult to control electrolytes and ascites. Renal consultation with Dr. Marval Regal. The patient is currently on Lasix 80 mg t.i.d. and very low dose aldactone 25 mg once daily with poor control of ascites. Early July 2008, large volume paracentesis with 9 liters of ascites removed (with albumin infusion). Chronic liver disease workup hepatitis A antibody positive (old exposure). Hepatitis B negative, hepatitis C negative; alpha 1 antitrypsin ceruloplasmin, ANA, AMA, antismooth muscle antibody all normal. Iron tests essentially normal. 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 Started on nadolol 20 mg daily. Summer, 2009 modifying her diuretic regimen to a more Aldactone based regimen.   Most recent EGD: 08/2015: normal EGD: no portal hypertension signs; next 08/2018  Was seen at [facility], Dr. Zollie Scale, for pre-transplant workup. Was bumped off transplant list due to bladder tumor diagnosis in 2012.   Most recent MELD UNOS 10, 2017 labs   Most recent AFP normal, 08/2016  Most recent liver imaging 08/2016 US showed no discrete liver lesions, + cirrhosis  Hep A immune, immunized for Hep B through work (works at [employer]).  2. Routine risk for colon cancer: 04/2007 colonoscopy found no poylps.  Recommended recall at 10 years.    HPI: This is a very pleasant 61 yo whom I last saw about 1 year ago  Chief complaint is alcohol related cirrhosis  She has been sober for 10 years. She is about 20 years healthier than she was 10 years ago. She is planning to run another marathon. She completed a triathlon  recently. This weekend she is running a half marathon.  She feels very well overall. No significant shortness of breath, no trouble with edema, no overt GI bleeding.  Weight is up 4 pounds since visit here 1 year ago.  ROS: complete GI ROS as described in HPI.  Constitutional:  No unintentional weight loss   Past Medical History:  Diagnosis Date  . Arthritis   . Asthma   . Cirrhosis, alcoholic ([place])   . Esophageal varices ([place])   . GERD (gastroesophageal reflux disease)   . Hypertension   . Hyponatremia   . Psoriasis     Past Surgical History:  Procedure Laterality Date  . BLADDER SURGERY     Tumor removed   . BREAST REDUCTION SURGERY     Bilateral     Current Outpatient Prescriptions  Medication Sig Dispense Refill  . albuterol (PROVENTIL HFA;VENTOLIN HFA) 108 (90 BASE) MCG/ACT inhaler Inhale 2 puffs into the lungs every 6 (six) hours as needed for wheezing or shortness of breath.    Marland Kitchen amLODipine (NORVASC) 5 MG tablet Take 5 mg by mouth daily.    Marland Kitchen b complex vitamins tablet Take 1 tablet by mouth daily.      . diclofenac (VOLTAREN) 75 MG EC tablet Take 75 mg by mouth daily.     . furosemide (LASIX) 40 MG tablet TAKE 1 TABLET BY MOUTH ONCE DAILY 30 tablet PRN  . Prenatal Multivit-Min-Fe-FA (PRE-NATAL FORMULA) TABS Take 1 tablet by mouth daily.     Marland Kitchen spironolactone (ALDACTONE) 50 MG tablet TAKE 1 TABLET BY MOUTH ONCE DAILY 30 tablet PRN   No current facility-administered medications for  this visit.     Allergies as of 09/15/2016 - Review Complete 09/15/2016  Allergen Reaction Noted  . Lisinopril  12/07/2006  . Sulfamethoxazole-trimethoprim  12/07/2006    Family History  Problem Relation Age of Onset  . Colon cancer Neg Hx     Social History   Social History  . Marital status: Married    Spouse name: N/A  . Number of children: N/A  . Years of education: N/A   Occupational History  . Not on file.   Social History Main Topics  . Smoking status: Never  Smoker  . Smokeless tobacco: Never Used  . Alcohol use No  . Drug use: No  . Sexual activity: Not on file   Other Topics Concern  . Not on file   Social History Narrative  . No narrative on file     Physical Exam: Ht 5' 2.75" (1.594 m)   Wt 156 lb 2 oz (70.8 kg)   BMI 27.88 kg/m  Constitutional: generally well-appearing Psychiatric: alert and oriented x3 Abdomen: soft, nontender, nondistended, no obvious ascites, no peritoneal signs, normal bowel sounds No peripheral edema noted in lower extremities  Assessment and plan: 61 y.o. female with Alcohol related cirrhosis, sober 10 years  She is really doing great. Her hepatoma screening is up-to-date. She is immune to hepatitis A and B. She has no overt signs of liver dysfunction. She'll get restaging of her liver disease again today including a CBC, complete about profile and coags. She will be due for colon cancer screening colonoscopy October of this year and we will make sure she is in our reminder system. She will have repeat hepatoma screening August of this year. She will return to see me in one year and sooner if needed.  Please see the "Patient Instructions" section for addition details about the plan.  Owens Loffler, MD [facility] 09/15/2016, 10:09 AM

## 2016-09-15 NOTE — Patient Instructions (Signed)
AFP and [place] 02/2017 for hepatoma screening. You will have labs checked today in the basement lab.  Please head down after you check out with the front desk  (cbc, cmet, inr) Please return to see Dr. Ardis Hughs in 12 months

## 2016-09-24 ENCOUNTER — Ambulatory Visit
Admission: RE | Admit: 2016-09-24 | Discharge: 2016-09-24 | Disposition: A | Discharge status: Home / Self Care | Payer: 59 | Source: Ambulatory Visit | Attending: Obstetrics and Gynecology | Admitting: Obstetrics and Gynecology

## 2016-09-24 DIAGNOSIS — Z1231 Encounter for screening mammogram for malignant neoplasm of breast: Secondary | ICD-10-CM | POA: Diagnosis not present

## 2016-10-28 MED FILL — DICLOFENAC SOD 75 MG TAB EC: 75 | 84 days supply | Qty: 36 | Fill #3

## 2016-11-03 MED FILL — VENTOLIN HFA 90 MCG INHALER: 108 (90 BAS | 25 days supply | Qty: 18 | Fill #1

## 2016-11-03 MED FILL — SPIRONOLACTONE 50 MG TABLET: 50 | 90 days supply | Qty: 90 | Fill #4

## 2016-11-12 DIAGNOSIS — K7030 Alcoholic cirrhosis of liver without ascites: Secondary | ICD-10-CM | POA: Diagnosis not present

## 2016-11-12 DIAGNOSIS — I10 Essential (primary) hypertension: Secondary | ICD-10-CM | POA: Diagnosis not present

## 2016-11-12 DIAGNOSIS — Z0000 Encounter for general adult medical examination without abnormal findings: Secondary | ICD-10-CM | POA: Diagnosis not present

## 2016-12-05 ENCOUNTER — Telehealth: Payer: 59 | Admitting: Nurse Practitioner

## 2016-12-05 DIAGNOSIS — S50862A Insect bite (nonvenomous) of left forearm, initial encounter: Secondary | ICD-10-CM | POA: Diagnosis not present

## 2016-12-05 DIAGNOSIS — W57XXXA Bitten or stung by nonvenomous insect and other nonvenomous arthropods, initial encounter: Secondary | ICD-10-CM | POA: Diagnosis not present

## 2016-12-05 NOTE — Progress Notes (Signed)
Spoke with patient over the phone and she emailed me a picture because she could not get to load into e visit. Dx with bug bites- OTC treatment was recommended and she was told to send another message if worsening.

## 2016-12-10 DIAGNOSIS — H20011 Primary iridocyclitis, right eye: Secondary | ICD-10-CM | POA: Diagnosis not present

## 2016-12-15 ENCOUNTER — Other Ambulatory Visit: Payer: Self-pay | Admitting: Gastroenterology

## 2016-12-15 MED FILL — FUROSEMIDE 40 MG TABLET: 40 | 30 days supply | Qty: 30 | Fill #0

## 2016-12-17 DIAGNOSIS — H20011 Primary iridocyclitis, right eye: Secondary | ICD-10-CM | POA: Diagnosis not present

## 2016-12-23 DIAGNOSIS — Z8551 Personal history of malignant neoplasm of bladder: Secondary | ICD-10-CM | POA: Diagnosis not present

## 2016-12-24 MED FILL — AMLODIPINE BESYLATE 5 MG TA: 5 | 90 days supply | Qty: 90 | Fill #0

## 2016-12-30 DIAGNOSIS — H20021 Recurrent acute iridocyclitis, right eye: Secondary | ICD-10-CM | POA: Diagnosis not present

## 2017-01-12 MED FILL — FUROSEMIDE 40 MG TABLET: 40 | 90 days supply | Qty: 90 | Fill #1

## 2017-01-19 MED FILL — DICLOFENAC SOD 75 MG TAB EC: 75 | 84 days supply | Qty: 36 | Fill #4

## 2017-01-20 MED FILL — VENTOLIN HFA 90 MCG INHALER: 108 (90 BAS | 25 days supply | Qty: 18 | Fill #2

## 2017-02-04 ENCOUNTER — Telehealth: Payer: Self-pay

## 2017-02-04 ENCOUNTER — Other Ambulatory Visit: Payer: 59

## 2017-02-04 ENCOUNTER — Other Ambulatory Visit: Payer: Self-pay | Admitting: Gastroenterology

## 2017-02-04 DIAGNOSIS — K7460 Unspecified cirrhosis of liver: Secondary | ICD-10-CM

## 2017-02-04 MED FILL — SPIRONOLACTONE 50 MG TABLET: 50 | 30 days supply | Qty: 30 | Fill #0

## 2017-02-04 NOTE — Telephone Encounter (Signed)
-----   Message from Barron Alvine, RN sent at 09/15/2016 10:23 AM EDT ----- AFP and [place] 02/2017 for hepatoma screening.

## 2017-02-04 NOTE — Telephone Encounter (Signed)
The pt will have labs this week and notified of the [place] via My Chart per pt request   You have been scheduled for an abdominal ultrasound at [facility] (1st floor of hospital) on 02/10/17 at 730 am. Please arrive 15 minutes prior to your appointment for registration. Make certain not to have anything to eat or drink 6 hours prior to your appointment. Should you need to reschedule your appointment, please contact radiology at [phone]. This test typically takes about 30 minutes to perform.

## 2017-02-05 LAB — AFP TUMOR MARKER: AFP-Tumor Marker: 5.4 ng/mL (ref <6.1)

## 2017-02-10 ENCOUNTER — Ambulatory Visit (HOSPITAL_COMMUNITY): Payer: 59

## 2017-02-17 ENCOUNTER — Ambulatory Visit (HOSPITAL_COMMUNITY)
Admission: RE | Admit: 2017-02-17 | Discharge: 2017-02-17 | Disposition: A | Discharge status: Home / Self Care | Payer: 59 | Source: Ambulatory Visit | Attending: Gastroenterology | Admitting: Gastroenterology

## 2017-02-17 DIAGNOSIS — K7460 Unspecified cirrhosis of liver: Secondary | ICD-10-CM | POA: Diagnosis not present

## 2017-03-01 DIAGNOSIS — H5203 Hypermetropia, bilateral: Secondary | ICD-10-CM | POA: Diagnosis not present

## 2017-03-10 MED FILL — AMLODIPINE BESYLATE 5 MG TA: 5 | 90 days supply | Qty: 90 | Fill #1

## 2017-03-10 MED FILL — SPIRONOLACTONE 50 MG TAB: 50 | 90 days supply | Qty: 90 | Fill #1

## 2017-03-16 ENCOUNTER — Ambulatory Visit (INDEPENDENT_AMBULATORY_CARE_PROVIDER_SITE_OTHER): Payer: 59 | Admitting: Ophthalmology

## 2017-03-16 DIAGNOSIS — H2513 Age-related nuclear cataract, bilateral: Secondary | ICD-10-CM

## 2017-03-16 DIAGNOSIS — H43813 Vitreous degeneration, bilateral: Secondary | ICD-10-CM | POA: Diagnosis not present

## 2017-03-16 DIAGNOSIS — H4322 Crystalline deposits in vitreous body, left eye: Secondary | ICD-10-CM

## 2017-04-15 MED FILL — FUROSEMIDE 40 MG TABLET: 40 | 90 days supply | Qty: 90 | Fill #2

## 2017-04-16 ENCOUNTER — Encounter (INDEPENDENT_AMBULATORY_CARE_PROVIDER_SITE_OTHER): Payer: 59 | Admitting: Ophthalmology

## 2017-04-16 DIAGNOSIS — H3553 Other dystrophies primarily involving the sensory retina: Secondary | ICD-10-CM

## 2017-04-16 DIAGNOSIS — H43813 Vitreous degeneration, bilateral: Secondary | ICD-10-CM | POA: Diagnosis not present

## 2017-04-16 DIAGNOSIS — H2513 Age-related nuclear cataract, bilateral: Secondary | ICD-10-CM

## 2017-04-16 DIAGNOSIS — H4322 Crystalline deposits in vitreous body, left eye: Secondary | ICD-10-CM

## 2017-04-19 MED FILL — DICLOFENAC SOD 75 MG TAB EC: 75 | 84 days supply | Qty: 36 | Fill #0

## 2017-05-10 ENCOUNTER — Encounter: Payer: Self-pay | Admitting: Gastroenterology

## 2017-05-11 ENCOUNTER — Encounter: Payer: Self-pay | Admitting: Gastroenterology

## 2017-06-01 DIAGNOSIS — D2272 Melanocytic nevi of left lower limb, including hip: Secondary | ICD-10-CM | POA: Diagnosis not present

## 2017-06-01 DIAGNOSIS — D225 Melanocytic nevi of trunk: Secondary | ICD-10-CM | POA: Diagnosis not present

## 2017-06-01 DIAGNOSIS — L820 Inflamed seborrheic keratosis: Secondary | ICD-10-CM | POA: Diagnosis not present

## 2017-06-01 DIAGNOSIS — D2271 Melanocytic nevi of right lower limb, including hip: Secondary | ICD-10-CM | POA: Diagnosis not present

## 2017-06-01 DIAGNOSIS — Z85828 Personal history of other malignant neoplasm of skin: Secondary | ICD-10-CM | POA: Diagnosis not present

## 2017-06-01 DIAGNOSIS — L570 Actinic keratosis: Secondary | ICD-10-CM | POA: Diagnosis not present

## 2017-06-01 DIAGNOSIS — L821 Other seborrheic keratosis: Secondary | ICD-10-CM | POA: Diagnosis not present

## 2017-06-07 MED FILL — SPIRONOLACTONE 50 MG TAB: 50 | 90 days supply | Qty: 90 | Fill #2

## 2017-06-23 MED FILL — AMLODIPINE BESYLATE 5 MG TA: 5 | 90 days supply | Qty: 90 | Fill #0

## 2017-07-01 ENCOUNTER — Other Ambulatory Visit: Payer: Self-pay

## 2017-07-01 ENCOUNTER — Ambulatory Visit (AMBULATORY_SURGERY_CENTER): Payer: Self-pay | Admitting: *Deleted

## 2017-07-01 VITALS — Ht 63.0 in | Wt 143.0 lb

## 2017-07-01 DIAGNOSIS — Z1211 Encounter for screening for malignant neoplasm of colon: Secondary | ICD-10-CM

## 2017-07-01 MED ORDER — NA SULFATE-K SULFATE-MG SULF 17.5-3.13-1.6 GM/177ML PO SOLN: 1.0000 | Freq: Once | ORAL | 0 refills | Status: AC

## 2017-07-01 MED FILL — SUPREP BOWEL PREP KIT: 17.5-3.13-1 | 1 days supply | Qty: 354 | Fill #0

## 2017-07-01 NOTE — Progress Notes (Signed)
No egg or soy allergy known to patient  No issues with past sedation with any surgeries  or procedures, no intubation problems  No diet pills per patient No home 02 use per patient  No blood thinners per patient  Pt denies issues with constipation  No A fib or A flutter  EMMI video sent to pt's e mail pt declined   

## 2017-07-10 ENCOUNTER — Ambulatory Visit (INDEPENDENT_AMBULATORY_CARE_PROVIDER_SITE_OTHER): Payer: Self-pay | Admitting: Family

## 2017-07-10 ENCOUNTER — Encounter: Payer: Self-pay | Admitting: Family

## 2017-07-10 VITALS — BP 112/82 | HR 81 | Temp 97.7°F | Wt 145.6 lb

## 2017-07-10 DIAGNOSIS — H9203 Otalgia, bilateral: Secondary | ICD-10-CM

## 2017-07-10 NOTE — Progress Notes (Signed)
Subjective:     Patient ID: [name] [name], female   DOB: 09/15/1955, 62 y.o.   MRN: [id number]  HPI 62 year old female is in today with bilateral ear pain since Wednesday. She denies any cough, congestion, fever, or chills. She reports doing some floor exercises yesterday, she got up and felt woozy. Symptoms have not occurred since then. She would like to be checked for an ear infection  Review of Systems  Constitutional: Negative.   HENT: Positive for ear pain. Negative for congestion, ear discharge, postnasal drip, rhinorrhea and sinus pain.   Eyes: Negative.   Cardiovascular: Negative.   Genitourinary: Negative.   Musculoskeletal: Negative.   Allergic/Immunologic: Negative.   Neurological: Negative.   Psychiatric/Behavioral: Negative.        Objective:   Physical Exam  Constitutional: She is oriented to person, place, and time. She appears well-developed and well-nourished.  HENT:  Right Ear: External ear normal.  Left Ear: External ear normal.  Nose: Nose normal.  Mouth/Throat: Oropharynx is clear and moist.  Neck: Normal range of motion. Neck supple.  Cardiovascular: Normal rate, regular rhythm and normal heart sounds.  Pulmonary/Chest: Effort normal and breath sounds normal.  Musculoskeletal: Normal range of motion.  Neurological: She is alert and oriented to person, place, and time.  Skin: Skin is warm and dry.  Psychiatric: She has a normal mood and affect.       Assessment:     Charisma was seen today for *choice* ear pain.  Diagnoses and all orders for this visit:  Otalgia of both ears      Plan:     Ibuprofen as needed for pain. No infection or infusion present. RX for Doxycycline given if symptoms worsen and she develops fever. Otherwise continue anti-inflammatory as she currently takes.

## 2017-07-10 NOTE — Patient Instructions (Signed)

## 2017-07-12 MED FILL — DICLOFENAC SODIUM 75 MG TAB: 75 | 84 days supply | Qty: 36 | Fill #1

## 2017-07-13 ENCOUNTER — Ambulatory Visit (AMBULATORY_SURGERY_CENTER): Payer: 59 | Admitting: Gastroenterology

## 2017-07-13 ENCOUNTER — Other Ambulatory Visit: Payer: Self-pay

## 2017-07-13 ENCOUNTER — Encounter: Payer: Self-pay | Admitting: Gastroenterology

## 2017-07-13 VITALS — BP 123/89 | HR 65 | Temp 98.0°F | Resp 11 | Ht 63.0 in | Wt 143.0 lb

## 2017-07-13 DIAGNOSIS — K7460 Unspecified cirrhosis of liver: Secondary | ICD-10-CM | POA: Diagnosis not present

## 2017-07-13 DIAGNOSIS — K621 Rectal polyp: Secondary | ICD-10-CM | POA: Diagnosis not present

## 2017-07-13 DIAGNOSIS — Z1212 Encounter for screening for malignant neoplasm of rectum: Secondary | ICD-10-CM

## 2017-07-13 DIAGNOSIS — D129 Benign neoplasm of anus and anal canal: Secondary | ICD-10-CM

## 2017-07-13 DIAGNOSIS — Z1211 Encounter for screening for malignant neoplasm of colon: Secondary | ICD-10-CM

## 2017-07-13 DIAGNOSIS — I10 Essential (primary) hypertension: Secondary | ICD-10-CM | POA: Diagnosis not present

## 2017-07-13 DIAGNOSIS — J45909 Unspecified asthma, uncomplicated: Secondary | ICD-10-CM | POA: Diagnosis not present

## 2017-07-13 DIAGNOSIS — D128 Benign neoplasm of rectum: Secondary | ICD-10-CM

## 2017-07-13 MED ORDER — SODIUM CHLORIDE 0.9 % IV SOLN: 500.0000 mL | Freq: Once | INTRAVENOUS | Status: DC

## 2017-07-13 NOTE — Patient Instructions (Signed)
YOU HAD AN ENDOSCOPIC PROCEDURE TODAY AT THE [facility]:   Refer to the procedure report that was given to you for any specific questions about what was found during the examination.  If the procedure report does not answer your questions, please call your gastroenterologist to clarify.  If you requested that your care partner not be given the details of your procedure findings, then the procedure report has been included in a sealed envelope for you to review at your convenience later.  YOU SHOULD EXPECT: Some feelings of bloating in the abdomen. Passage of more gas than usual.  Walking can help get rid of the air that was put into your GI tract during the procedure and reduce the bloating. If you had a lower endoscopy (such as a colonoscopy or flexible sigmoidoscopy) you may notice spotting of blood in your stool or on the toilet paper. If you underwent a bowel prep for your procedure, you may not have a normal bowel movement for a few days.  Please Note:  You might notice some irritation and congestion in your nose or some drainage.  This is from the oxygen used during your procedure.  There is no need for concern and it should clear up in a day or so.  SYMPTOMS TO REPORT IMMEDIATELY:   Following lower endoscopy (colonoscopy or flexible sigmoidoscopy):  Excessive amounts of blood in the stool  Significant tenderness or worsening of abdominal pains  Swelling of the abdomen that is new, acute  Fever of 100F or higher  For urgent or emergent issues, a gastroenterologist can be reached at any hour by calling [phone].   DIET:  We do recommend a small meal at first, but then you may proceed to your regular diet.  Drink plenty of fluids but you should avoid alcoholic beverages for 24 hours.  ACTIVITY:  You should plan to take it easy for the rest of today and you should NOT DRIVE or use heavy machinery until tomorrow (because of the sedation medicines used during the test).     FOLLOW UP: Our staff will call the number listed on your records the next business day following your procedure to check on you and address any questions or concerns that you may have regarding the information given to you following your procedure. If we do not reach you, we will leave a message.  However, if you are feeling well and you are not experiencing any problems, there is no need to return our call.  We will assume that you have returned to your regular daily activities without incident.  If any biopsies were taken you will be contacted by phone or by letter within the next 1-3 weeks.  Please call us at [phone] if you have not heard about the biopsies in 3 weeks.   Await for biopsy results to determine next repeat Colonoscopy screening Polyps (handout given)  SIGNATURES/CONFIDENTIALITY: You and/or your care partner have signed paperwork which will be entered into your electronic medical record.  These signatures attest to the fact that that the information above on your After Visit Summary has been reviewed and is understood.  Full responsibility of the confidentiality of this discharge information lies with you and/or your care-partner. 

## 2017-07-13 NOTE — Progress Notes (Signed)
Report to PACU, RN, vss, BBS= Clear.  

## 2017-07-13 NOTE — Progress Notes (Signed)
Called to room to assist during endoscopic procedure.  Patient ID and intended procedure confirmed with present staff. Received instructions for my participation in the procedure from the performing physician.  

## 2017-07-13 NOTE — Op Note (Signed)
[place] Patient Name: [name] [name] Procedure Date: 07/13/2017 9:54 AM MRN: [id number] Endoscopist: Milus Banister , MD Age: 62 Referring MD:  Date of Birth: Sep 28, 1955 Gender: Female Account #: [id number] Procedure:                Colonoscopy Indications:              Screening for colorectal malignant neoplasm Medicines:                Monitored Anesthesia Care Procedure:                Pre-Anesthesia Assessment:                           - Prior to the procedure, a History and Physical                            was performed, and patient medications and                            allergies were reviewed. The patient's tolerance of                            previous anesthesia was also reviewed. The risks                            and benefits of the procedure and the sedation                            options and risks were discussed with the patient.                            All questions were answered, and informed consent                            was obtained. Prior Anticoagulants: The patient has                            taken no previous anticoagulant or antiplatelet                            agents. ASA Grade Assessment: III - A patient with                            severe systemic disease. After reviewing the risks                            and benefits, the patient was deemed in                            satisfactory condition to undergo the procedure.                           After obtaining informed consent, the colonoscope  was passed under direct vision. Throughout the                            procedure, the patient's blood pressure, pulse, and                            oxygen saturations were monitored continuously. The                            Colonoscope was introduced through the anus and                            advanced to the the cecum, identified by                            appendiceal orifice  and ileocecal valve. The                            colonoscopy was performed without difficulty. The                            patient tolerated the procedure well. The quality                            of the bowel preparation was excellent. The                            ileocecal valve, appendiceal orifice, and rectum                            were photographed. Scope In: 9:57:09 AM Scope Out: 10:10:38 AM Scope Withdrawal Time: 0 hours 10 minutes 31 seconds  Total Procedure Duration: 0 hours 13 minutes 29 seconds  Findings:                 A 2 mm polyp was found in the rectum. The polyp was                            sessile. The polyp was removed with a cold biopsy                            forceps. Resection and retrieval were complete.                           The exam was otherwise without abnormality on                            direct and retroflexion views. Complications:            No immediate complications. Estimated blood loss:                            None. Estimated Blood Loss:     Estimated blood loss: none. Impression:               -  One 2 mm polyp in the rectum, removed with a cold                            biopsy forceps. Resected and retrieved.                           - The examination was otherwise normal on direct                            and retroflexion views. Recommendation:           - Patient has a contact number available for                            emergencies. The signs and symptoms of potential                            delayed complications were discussed with the                            patient. Return to normal activities tomorrow.                            Written discharge instructions were provided to the                            patient.                           - Resume previous diet.                           - Continue present medications.                           You will receive a letter within 2-3 weeks with the                             pathology results and my final recommendations.                           If the polyp(s) is proven to be 'pre-cancerous' on                            pathology, you will need repeat colonoscopy in 5                            years. If the polyp(s) is NOT 'precancerous' on                            pathology then you should repeat colon cancer                            screening in 10 years with colonoscopy without need  for colon cancer screening by any method prior to                            then (including stool testing). Milus Banister, MD 07/13/2017 10:13:11 AM This report has been signed electronically.

## 2017-07-14 ENCOUNTER — Telehealth: Payer: Self-pay

## 2017-07-14 NOTE — Telephone Encounter (Signed)
  Follow up Call-  Call back number 07/13/2017 08/26/2015  Post procedure Call Back phone  # [phone]  Permission to leave phone message Yes Yes  Some recent data might be hidden     Patient questions:  Do you have a fever, pain , or abdominal swelling? No. Pain Score  0 *  Have you tolerated food without any problems? Yes.    Have you been able to return to your normal activities? Yes.    Do you have any questions about your discharge instructions: Diet   No. Medications  No. Follow up visit  No.  Do you have questions or concerns about your Care? No.  Actions: * If pain score is 4 or above: No action needed, pain <4.

## 2017-07-16 ENCOUNTER — Encounter: Payer: Self-pay | Admitting: Gastroenterology

## 2017-07-19 MED FILL — FUROSEMIDE 40 MG TAB: 40 | 90 days supply | Qty: 90 | Fill #3

## 2017-08-06 ENCOUNTER — Encounter: Payer: Self-pay | Admitting: Gastroenterology

## 2017-08-19 ENCOUNTER — Other Ambulatory Visit: Payer: Self-pay | Admitting: Obstetrics and Gynecology

## 2017-08-19 ENCOUNTER — Encounter (INDEPENDENT_AMBULATORY_CARE_PROVIDER_SITE_OTHER): Payer: 59 | Admitting: Ophthalmology

## 2017-08-19 DIAGNOSIS — H4322 Crystalline deposits in vitreous body, left eye: Secondary | ICD-10-CM

## 2017-08-19 DIAGNOSIS — H3553 Other dystrophies primarily involving the sensory retina: Secondary | ICD-10-CM | POA: Diagnosis not present

## 2017-08-19 DIAGNOSIS — H2513 Age-related nuclear cataract, bilateral: Secondary | ICD-10-CM | POA: Diagnosis not present

## 2017-08-19 DIAGNOSIS — H43813 Vitreous degeneration, bilateral: Secondary | ICD-10-CM | POA: Diagnosis not present

## 2017-08-19 DIAGNOSIS — Z1231 Encounter for screening mammogram for malignant neoplasm of breast: Secondary | ICD-10-CM

## 2017-08-23 ENCOUNTER — Telehealth: Payer: Self-pay

## 2017-08-23 DIAGNOSIS — K7030 Alcoholic cirrhosis of liver without ascites: Secondary | ICD-10-CM

## 2017-08-23 NOTE — Telephone Encounter (Signed)
-----   Message from Jeoffrey Massed, RN sent at 02/19/2017 10:46 AM EDT -----  rov with me in 6 months.  Should have repeat AFP and [place] shortly before that appt

## 2017-08-23 NOTE — Telephone Encounter (Signed)
11/01/17 9 am ROV with Dr Ardis Hughs [place] order in [place] need to call and get scheduled and notify pt

## 2017-08-24 NOTE — Telephone Encounter (Signed)
The pt has been advised and instructed   You have been scheduled for an abdominal ultrasound at [facility]  Radiology (1st floor of hospital) on 08/31/17 at 7 am. Please arrive 15 minutes prior to your appointment for registration. Make certain not to have anything to eat or drink 6 hours prior to your appointment. Should you need to reschedule your appointment, please contact radiology at [phone]. This test typically takes about 30 minutes to perform.

## 2017-08-27 ENCOUNTER — Other Ambulatory Visit: Payer: 59

## 2017-08-27 ENCOUNTER — Telehealth: Payer: Self-pay | Admitting: Gastroenterology

## 2017-08-27 DIAGNOSIS — K7030 Alcoholic cirrhosis of liver without ascites: Secondary | ICD-10-CM

## 2017-08-27 NOTE — Telephone Encounter (Signed)
The pt has been advised of the [place] at [facility] on 2/28 at 7 am

## 2017-08-30 LAB — AFP TUMOR MARKER: AFP-Tumor Marker: 4 ng/mL

## 2017-08-31 ENCOUNTER — Ambulatory Visit (HOSPITAL_COMMUNITY): Payer: 59

## 2017-08-31 DIAGNOSIS — H6981 Other specified disorders of Eustachian tube, right ear: Secondary | ICD-10-CM | POA: Diagnosis not present

## 2017-08-31 DIAGNOSIS — H9041 Sensorineural hearing loss, unilateral, right ear, with unrestricted hearing on the contralateral side: Secondary | ICD-10-CM | POA: Diagnosis not present

## 2017-08-31 MED FILL — FLUTICASONE PROP 50 MCG SPR: 50 | 30 days supply | Qty: 16 | Fill #0

## 2017-08-31 MED FILL — predniSONE 10 MG TABS: 10 | 6 days supply | Qty: 21 | Fill #0

## 2017-09-02 ENCOUNTER — Ambulatory Visit (HOSPITAL_COMMUNITY)
Admission: RE | Admit: 2017-09-02 | Discharge: 2017-09-02 | Disposition: A | Discharge status: Home / Self Care | Payer: 59 | Source: Ambulatory Visit | Attending: Gastroenterology | Admitting: Gastroenterology

## 2017-09-02 DIAGNOSIS — K7030 Alcoholic cirrhosis of liver without ascites: Secondary | ICD-10-CM | POA: Insufficient documentation

## 2017-09-02 DIAGNOSIS — K7689 Other specified diseases of liver: Secondary | ICD-10-CM | POA: Diagnosis not present

## 2017-09-06 MED FILL — SPIRONOLACTONE 50 MG TABLET: 50 | 90 days supply | Qty: 90 | Fill #3

## 2017-09-07 MED FILL — VENTOLIN HFA 90 MCG INHALER: 108 (90 BAS | 50 days supply | Qty: 54 | Fill #0

## 2017-09-23 DIAGNOSIS — Z6823 Body mass index (BMI) 23.0-23.9, adult: Secondary | ICD-10-CM | POA: Diagnosis not present

## 2017-09-23 DIAGNOSIS — Z01419 Encounter for gynecological examination (general) (routine) without abnormal findings: Secondary | ICD-10-CM | POA: Diagnosis not present

## 2017-09-24 MED FILL — AMLODIPINE BESYLATE 5 MG TA: 5 | 90 days supply | Qty: 90 | Fill #1

## 2017-09-24 MED FILL — FLUTICASONE PROP 50 MCG SPR: 50 | 30 days supply | Qty: 16 | Fill #1

## 2017-09-27 ENCOUNTER — Ambulatory Visit
Admission: RE | Admit: 2017-09-27 | Discharge: 2017-09-27 | Disposition: A | Discharge status: Home / Self Care | Payer: 59 | Source: Ambulatory Visit | Attending: Obstetrics and Gynecology | Admitting: Obstetrics and Gynecology

## 2017-09-27 DIAGNOSIS — Z1231 Encounter for screening mammogram for malignant neoplasm of breast: Secondary | ICD-10-CM

## 2017-09-29 DIAGNOSIS — H9311 Tinnitus, right ear: Secondary | ICD-10-CM | POA: Diagnosis not present

## 2017-09-29 DIAGNOSIS — H8101 Meniere's disease, right ear: Secondary | ICD-10-CM | POA: Diagnosis not present

## 2017-09-29 DIAGNOSIS — H9041 Sensorineural hearing loss, unilateral, right ear, with unrestricted hearing on the contralateral side: Secondary | ICD-10-CM | POA: Diagnosis not present

## 2017-09-30 ENCOUNTER — Other Ambulatory Visit (INDEPENDENT_AMBULATORY_CARE_PROVIDER_SITE_OTHER): Payer: Self-pay | Admitting: Otolaryngology

## 2017-09-30 DIAGNOSIS — H9011 Conductive hearing loss, unilateral, right ear, with unrestricted hearing on the contralateral side: Secondary | ICD-10-CM

## 2017-10-04 MED FILL — DICLOFENAC SODIUM 75 MG TAB: 75 | 84 days supply | Qty: 36 | Fill #2

## 2017-10-06 ENCOUNTER — Ambulatory Visit (HOSPITAL_COMMUNITY)
Admission: RE | Admit: 2017-10-06 | Discharge: 2017-10-06 | Disposition: A | Discharge status: Home / Self Care | Payer: 59 | Source: Ambulatory Visit | Attending: Otolaryngology | Admitting: Otolaryngology

## 2017-10-06 DIAGNOSIS — H9011 Conductive hearing loss, unilateral, right ear, with unrestricted hearing on the contralateral side: Secondary | ICD-10-CM | POA: Insufficient documentation

## 2017-10-06 DIAGNOSIS — H9012 Conductive hearing loss, unilateral, left ear, with unrestricted hearing on the contralateral side: Secondary | ICD-10-CM | POA: Diagnosis not present

## 2017-10-06 LAB — POCT I-STAT CREATININE: Creatinine, Ser: 0.8 mg/dL (ref 0.44–1.00)

## 2017-10-06 MED ORDER — GADOBENATE DIMEGLUMINE 529 MG/ML IV SOLN
14.0000 mL | Freq: Once | INTRAVENOUS | Status: AC | PRN
  Administered 2017-10-06: 14 mL via INTRAVENOUS

## 2017-10-25 MED FILL — FUROSEMIDE 40 MG TAB: 40 | 90 days supply | Qty: 90 | Fill #4

## 2017-11-01 ENCOUNTER — Encounter: Payer: Self-pay | Admitting: Gastroenterology

## 2017-11-01 ENCOUNTER — Ambulatory Visit: Payer: 59 | Admitting: Gastroenterology

## 2017-11-01 VITALS — BP 128/80 | HR 77 | Ht 63.0 in | Wt 130.4 lb

## 2017-11-01 DIAGNOSIS — K7460 Unspecified cirrhosis of liver: Secondary | ICD-10-CM

## 2017-11-01 NOTE — Progress Notes (Signed)
Review of pertinent gastrointestinal problems:  1. Alcoholic cirrhosis, extensive, long history of drinking, presented winter, 2008 with jaundice and ascites. Hospitalized April, 2008 for several days. Her most recent imaging was an ultrasound April, 2007 showing an echogenic nodular liver consistent with cirrhosis, no obvious masses. Ascites. In June 2008, difficult to control electrolytes and ascites. Renal consultation with Dr. Marval Regal. The patient is currently on Lasix 80 mg t.i.d. and very low dose aldactone 25 mg once daily with poor control of ascites. Early July 2008, large volume paracentesis with 9 liters of ascites removed (with albumin infusion). Chronic liver disease workup hepatitis A antibody positive (old exposure). Hepatitis B negative, hepatitis C negative; alpha 1 antitrypsin ceruloplasmin, ANA, AMA, antismooth muscle antibody all normal. Iron tests essentially normal. 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 Started on nadolol 20 mg daily. Summer, 2009 modifying her diuretic regimen to a more Aldactone based regimen.   Most recent EGD: 08/2015: normal EGD: no portal hypertension signs; next 08/2018  Was seen at [facility], Dr. Zollie Scale, for pre-transplant workup. Was bumped off transplant list due to bladder tumor diagnosis in 2012.   Most recent MELD UNOS 11, 2018 labs   Most recent AFP normal, 08/2017  Most recent liver imaging 08/2017 US showed no discrete liver lesions, + cirrhosis  Hep A immune, immunized for Hep B through work (works at [employer]).  2. Routine risk for colon cancer:04/2007 colonoscopy found no poylps. Colonoscopy 07/2017 Dr. Ardis Hughs one 41mm rectal polyp was not precancerous. Recall recommended at 10 year interval.   HPI: This is a very pleasant 62 year old woman whom I last saw several months ago at the time of a screening colonoscopy.  See that result above.  Chief complaint is cirrhosis  Has been doing weight  watchers and has lost 26 pounds in the past year with significant intention no overt GI bleeding, no Encephalopathy, no difficulty with fluid retention  Has been running more, 1/2 marathons; two of them.  ROS: complete GI ROS as described in HPI, all other review negative.  Constitutional:  No unintentional weight loss    Past Medical History:  Diagnosis Date  . Allergy    mild  . Arthritis   . Asthma   . Cancer [facility])    bladder cancer  . Cirrhosis, alcoholic ([place])   . Esophageal varices ([place])   . GERD (gastroesophageal reflux disease)   . Hypertension   . Hyponatremia   . Psoriasis     Past Surgical History:  Procedure Laterality Date  . BLADDER SURGERY     Tumor removed   . BREAST REDUCTION SURGERY     Bilateral   . COLONOSCOPY    . REDUCTION MAMMAPLASTY      Current Outpatient Medications  Medication Sig Dispense Refill  . albuterol (PROVENTIL HFA;VENTOLIN HFA) 108 (90 BASE) MCG/ACT inhaler Inhale 2 puffs into the lungs every 6 (six) hours as needed for wheezing or shortness of breath.    Marland Kitchen amLODipine (NORVASC) 5 MG tablet Take 5 mg by mouth daily.    Marland Kitchen b complex vitamins tablet Take 1 tablet by mouth daily.      . diclofenac (VOLTAREN) 75 MG EC tablet Take 75 mg by mouth daily.     . furosemide (LASIX) 40 MG tablet TAKE 1 TABLET BY MOUTH ONCE DAILY 30 tablet PRN  . LUTEIN-ZEAXANTHIN PO Take by mouth daily.    . Prenatal Multivit-Min-Fe-FA (PRE-NATAL FORMULA) TABS Take 1 tablet by mouth  daily.     . Probiotic Product (PROBIOTIC DAILY PO) Take by mouth daily.    Marland Kitchen spironolactone (ALDACTONE) 50 MG tablet TAKE 1 TABLET BY MOUTH ONCE DAILY 30 tablet PRN   No current facility-administered medications for this visit.     Allergies as of 11/01/2017 - Review Complete 11/01/2017  Allergen Reaction Noted  . Lisinopril  12/07/2006  . Sulfamethoxazole-trimethoprim  12/07/2006    Family History  Problem Relation Age of Onset  . Diabetes Paternal Grandmother   .  Diabetes Paternal Grandfather   . Colon cancer Neg Hx   . Colon polyps Neg Hx     Social History   Socioeconomic History  . Marital status: Married    Spouse name: Not on file  . Number of children: Not on file  . Years of education: Not on file  . Highest education level: Not on file  Occupational History  . Not on file  Social Needs  . Financial resource strain: Not on file  . Food insecurity:    Worry: Not on file    Inability: Not on file  . Transportation needs:    Medical: Not on file    Non-medical: Not on file  Tobacco Use  . Smoking status: Never Smoker  . Smokeless tobacco: Never Used  Substance and Sexual Activity  . Alcohol use: No  . Drug use: No  . Sexual activity: Not on file  Lifestyle  . Physical activity:    Days per week: Not on file    Minutes per session: Not on file  . Stress: Not on file  Relationships  . Social connections:    Talks on phone: Not on file    Gets together: Not on file    Attends religious service: Not on file    Active member of club or organization: Not on file    Attends meetings of clubs or organizations: Not on file    Relationship status: Not on file  . Intimate partner violence:    Fear of current or ex partner: Not on file    Emotionally abused: Not on file    Physically abused: Not on file    Forced sexual activity: Not on file  Other Topics Concern  . Not on file  Social History Narrative  . Not on file     Physical Exam: Ht 5\' 3"  (1.6 m)   Wt 130 lb 6 oz (59.1 kg)   BMI 23.09 kg/m  Constitutional: generally well-appearing Psychiatric: alert and oriented x3 Abdomen: soft, nontender, nondistended, no obvious ascites, no peritoneal signs, normal bowel sounds No peripheral edema noted in lower extremities  Assessment and plan: 62 y.o. female with cirrhosis  She is on minimal diuretics with very good control of her edema.  She is in generally very good health and I would say she has well compensated  cirrhosis.  She runs half marathons.  She has lost about 25 pounds in the past year with significant intention, she joined [employer] and has been eating better, exercising more.  She will get liver staging blood tests as well as alpha-fetoprotein and ultrasound for hepatoma screening in August of this year.  She needs repeat upper endoscopy early 2020.  Recall office visit in 12 months from now.  Please see the "Patient Instructions" section for addition details about the plan.  Owens Loffler, MD [facility] 11/01/2017, 9:09 AM

## 2017-11-01 NOTE — Patient Instructions (Addendum)
Labs 02/2018: cbc, cmet, inr, AFP. [place] 02/2018: for hepatoma screening. Recall EGD should be 08/2018 (varices screening). Please return to see Dr. Ardis Hughs in 1 year.  It is important that you have a relatively low salt diet.  High salt diet can cause fluid to accumulate in your legs, abdomen and even around your lungs. You should try to avoid NSAID type over the counter pain medicines as best as possible. Tylenol is safe to take for 'routine' aches and pains, but never take more than 1/2 the dose suggested on the package instructions (never more than 2 grams per day). Avoid alcohol.  Normal BMI (Body Mass Index- based on height and weight) is between 19 and 25. Your BMI today is Body mass index is 23.09 kg/m. Marland Kitchen Please consider follow up  regarding your BMI with your Primary Care Provider.

## 2017-11-02 DIAGNOSIS — H9041 Sensorineural hearing loss, unilateral, right ear, with unrestricted hearing on the contralateral side: Secondary | ICD-10-CM | POA: Diagnosis not present

## 2017-11-02 DIAGNOSIS — H8101 Meniere's disease, right ear: Secondary | ICD-10-CM | POA: Diagnosis not present

## 2017-11-02 DIAGNOSIS — H9311 Tinnitus, right ear: Secondary | ICD-10-CM | POA: Diagnosis not present

## 2017-11-03 MED FILL — FLUTICASONE PROP 50 MCG SPR: 50 | 30 days supply | Qty: 16 | Fill #2

## 2017-11-15 DIAGNOSIS — Z0000 Encounter for general adult medical examination without abnormal findings: Secondary | ICD-10-CM | POA: Diagnosis not present

## 2017-11-15 DIAGNOSIS — K7030 Alcoholic cirrhosis of liver without ascites: Secondary | ICD-10-CM | POA: Diagnosis not present

## 2017-11-15 DIAGNOSIS — I10 Essential (primary) hypertension: Secondary | ICD-10-CM | POA: Diagnosis not present

## 2017-11-15 DIAGNOSIS — H9311 Tinnitus, right ear: Secondary | ICD-10-CM | POA: Diagnosis not present

## 2017-12-01 MED FILL — SPIRONOLACTONE 50 MG TAB: 50 | 90 days supply | Qty: 90 | Fill #4

## 2017-12-13 MED FILL — FLUTICASONE PROP 50 MCG SPR: 50 | 30 days supply | Qty: 16 | Fill #3

## 2017-12-17 DIAGNOSIS — Z8551 Personal history of malignant neoplasm of bladder: Secondary | ICD-10-CM | POA: Diagnosis not present

## 2017-12-22 MED FILL — DICLOFENAC SODIUM 75 MG TAB: 75 | 84 days supply | Qty: 36 | Fill #3

## 2017-12-22 MED FILL — AMLODIPINE BESYLATE 5 MG TA: 5 | 90 days supply | Qty: 90 | Fill #2

## 2018-01-02 ENCOUNTER — Telehealth: Payer: 59 | Admitting: Physician Assistant

## 2018-01-02 DIAGNOSIS — H1033 Unspecified acute conjunctivitis, bilateral: Secondary | ICD-10-CM

## 2018-01-02 MED ORDER — OFLOXACIN 0.3 % OP SOLN: 1.0000 [drp] | Freq: Four times a day (QID) | OPHTHALMIC | 0 refills | Status: DC

## 2018-01-02 NOTE — Progress Notes (Signed)
We are sorry that you are not feeling well.  Here is how we plan to help!  Based on what you have shared with me it looks like you have conjunctivitis.  Conjunctivitis is a common inflammatory or infectious condition of the eye that is often referred to as "pink eye".  In most cases it is contagious (viral or bacterial). However, not all conjunctivitis requires antibiotics (ex. Allergic).  We have made appropriate suggestions for you based upon your presentation.  I have prescribed Oflaxacin 1-2 drops 4 times a day times 5 days   Pink eye can be highly contagious.  It is typically spread through direct contact with secretions, or contaminated objects or surfaces that one may have touched.  Strict handwashing is suggested with soap and water is urged.  If not available, use alcohol based had sanitizer.  Avoid unnecessary touching of the eye.  If you wear contact lenses, you will need to refrain from wearing them until you see no white discharge from the eye for at least 24 hours after being on medication.  You should see symptom improvement in 1-2 days after starting the medication regimen.  Call us if symptoms are not improved in 1-2 days.  Home Care:  Wash your hands often!  Do not wear your contacts until you complete your treatment plan.  Avoid sharing towels, bed linen, personal items with a person who has pink eye.  See attention for anyone in your home with similar symptoms.  Get Help Right Away If:  Your symptoms do not improve.  You develop blurred or loss of vision.  Your symptoms worsen (increased discharge, pain or redness)  Your e-visit answers were reviewed by a board certified advanced clinical practitioner to complete your personal care plan.  Depending on the condition, your plan could have included both over the counter or prescription medications.  If there is a problem please reply  once you have received a response from your provider.  Your safety is important to us.   If you have drug allergies check your prescription carefully.    You can use MyChart to ask questions about today's visit, request a non-urgent call back, or ask for a work or school excuse for 24 hours related to this e-Visit. If it has been greater than 24 hours you will need to follow up with your provider, or enter a new e-Visit to address those concerns.   You will get an e-mail in the next two days asking about your experience.  I hope that your e-visit has been valuable and will speed your recovery. Thank you for using e-visits.      

## 2018-01-20 ENCOUNTER — Other Ambulatory Visit: Payer: Self-pay | Admitting: Gastroenterology

## 2018-01-20 MED FILL — FUROSEMIDE 40 MG TAB: 40 | 30 days supply | Qty: 30 | Fill #0

## 2018-02-01 DIAGNOSIS — H8101 Meniere's disease, right ear: Secondary | ICD-10-CM | POA: Diagnosis not present

## 2018-02-01 DIAGNOSIS — H9041 Sensorineural hearing loss, unilateral, right ear, with unrestricted hearing on the contralateral side: Secondary | ICD-10-CM | POA: Diagnosis not present

## 2018-02-01 DIAGNOSIS — H9311 Tinnitus, right ear: Secondary | ICD-10-CM | POA: Diagnosis not present

## 2018-02-02 ENCOUNTER — Other Ambulatory Visit: Payer: Self-pay | Admitting: Gastroenterology

## 2018-02-02 DIAGNOSIS — K7460 Unspecified cirrhosis of liver: Secondary | ICD-10-CM

## 2018-02-02 NOTE — Progress Notes (Signed)
I Spoke to patient who was seen in office 11/01/17. Per Dr Ardis Hughs orders she has been scheduled an appointment for abdominal US for hepatoma screening 03/12/18 at 8am. She understands the instructions to be NPO after midnight. Patient will come in for labs on 02/04/18.

## 2018-02-04 ENCOUNTER — Other Ambulatory Visit (INDEPENDENT_AMBULATORY_CARE_PROVIDER_SITE_OTHER): Payer: 59

## 2018-02-04 ENCOUNTER — Encounter: Payer: Self-pay | Admitting: Family Medicine

## 2018-02-04 ENCOUNTER — Ambulatory Visit: Payer: 59 | Admitting: Family Medicine

## 2018-02-04 ENCOUNTER — Ambulatory Visit (INDEPENDENT_AMBULATORY_CARE_PROVIDER_SITE_OTHER)
Admission: RE | Admit: 2018-02-04 | Discharge: 2018-02-04 | Disposition: A | Discharge status: Home / Self Care | Payer: 59 | Source: Ambulatory Visit | Attending: Family Medicine | Admitting: Family Medicine

## 2018-02-04 VITALS — BP 118/90 | HR 72 | Ht 63.0 in | Wt 133.0 lb

## 2018-02-04 DIAGNOSIS — K7460 Unspecified cirrhosis of liver: Secondary | ICD-10-CM | POA: Diagnosis not present

## 2018-02-04 DIAGNOSIS — M545 Low back pain, unspecified: Secondary | ICD-10-CM

## 2018-02-04 DIAGNOSIS — S76212A Strain of adductor muscle, fascia and tendon of left thigh, initial encounter: Secondary | ICD-10-CM | POA: Insufficient documentation

## 2018-02-04 LAB — COMPREHENSIVE METABOLIC PANEL
ALBUMIN: 4.3 g/dL (ref 3.5–5.2)
ALT: 37 U/L — AB (ref 0–35)
AST: 37 U/L (ref 0–37)
Alkaline Phosphatase: 138 U/L — ABNORMAL HIGH (ref 39–117)
BUN: 20 mg/dL (ref 6–23)
CALCIUM: 9.6 mg/dL (ref 8.4–10.5)
CO2: 31 meq/L (ref 19–32)
Chloride: 99 mEq/L (ref 96–112)
Creatinine, Ser: 0.85 mg/dL (ref 0.40–1.20)
GFR: 71.75 mL/min (ref >60.00)
Glucose, Bld: 97 mg/dL (ref 70–99)
Potassium: 3.7 mEq/L (ref 3.5–5.1)
SODIUM: 139 meq/L (ref 135–145)
Total Bilirubin: 1 mg/dL (ref 0.2–1.2)
Total Protein: 7.5 g/dL (ref 6.0–8.3)

## 2018-02-04 LAB — CBC WITH DIFFERENTIAL/PLATELET
Basophils Absolute: 0.1 10*3/uL (ref 0.0–0.1)
Basophils Relative: 1.1 % (ref 0.0–3.0)
EOS PCT: 0.6 % (ref 0.0–5.0)
Eosinophils Absolute: 0.1 10*3/uL (ref 0.0–0.7)
HEMATOCRIT: 42.4 % (ref 36.0–46.0)
HEMOGLOBIN: 14.4 g/dL (ref 12.0–15.0)
LYMPHS PCT: 22.2 % (ref 12.0–46.0)
Lymphs Abs: 2.4 10*3/uL (ref 0.7–4.0)
MCHC: 34 g/dL (ref 30.0–36.0)
MCV: 96.9 fl (ref 78.0–100.0)
Monocytes Absolute: 0.6 10*3/uL (ref 0.1–1.0)
Monocytes Relative: 5.6 % (ref 3.0–12.0)
NEUTROS ABS: 7.5 10*3/uL (ref 1.4–7.7)
Neutrophils Relative %: 70.5 % (ref 43.0–77.0)
Platelets: 258 10*3/uL (ref 150.0–400.0)
RBC: 4.37 Mil/uL (ref 3.87–5.11)
RDW: 13.7 % (ref 11.5–15.5)
WBC: 10.7 10*3/uL — AB (ref 4.0–10.5)

## 2018-02-04 LAB — PROTIME-INR
INR: 1.3 ratio — AB (ref 0.8–1.0)
PROTHROMBIN TIME: 14.6 s — AB (ref 9.6–13.1)

## 2018-02-04 NOTE — Assessment & Plan Note (Signed)
At adductor strain more than back.  Home exercises given, discussed compression, topical anti-inflammatories, decreased running volume, discussed proper shoes.  Follow-up again in 4 weeks

## 2018-02-04 NOTE — Progress Notes (Signed)
[facility] [address], [place] [address] Phone: [phone] Subjective:    I'm seeing this patient by the request  of:  Lavone Orn, MD   CC: back pain   [id number]  [name] [name] is a 62 y.o. female coming in with complaint of lower, left back pain. Patient was performing leg curls 3 weeks ago that caused the pain. Patient has been able to continue run. Does feel that she has intermittent radiating pain into the quad, left leg. Pain worse at the end of the day and with trunk flexion. Is training for [facility] in October 2019.  Patient states that it is more down the left leg.  Since it seems to be more in the buttocks.  Since then going up or down hills seems to make it somewhat worse.  Patient denies that it ever passes the knee.  Rates the severity pain is 6 out of 10 and does keep her from running somewhat.  Denies any true weakness or any true numbness rates the severity of pain 6 out of 10       Past Medical History:  Diagnosis Date  . Allergy    mild  . Arthritis   . Asthma   . Cancer [facility])    bladder cancer  . Cirrhosis, alcoholic ([place])   . Esophageal varices ([place])   . GERD (gastroesophageal reflux disease)   . Hypertension   . Hyponatremia   . Psoriasis    Past Surgical History:  Procedure Laterality Date  . BLADDER SURGERY     Tumor removed   . BREAST REDUCTION SURGERY     Bilateral   . COLONOSCOPY    . REDUCTION MAMMAPLASTY     Social History   Socioeconomic History  . Marital status: Married    Spouse name: Not on file  . Number of children: Not on file  . Years of education: Not on file  . Highest education level: Not on file  Occupational History  . Not on file  Social Needs  . Financial resource strain: Not on file  . Food insecurity:    Worry: Not on file    Inability: Not on file  . Transportation needs:    Medical: Not on file    Non-medical: Not on file  Tobacco Use  . Smoking  status: Never Smoker  . Smokeless tobacco: Never Used  Substance and Sexual Activity  . Alcohol use: No  . Drug use: No  . Sexual activity: Not on file  Lifestyle  . Physical activity:    Days per week: Not on file    Minutes per session: Not on file  . Stress: Not on file  Relationships  . Social connections:    Talks on phone: Not on file    Gets together: Not on file    Attends religious service: Not on file    Active member of club or organization: Not on file    Attends meetings of clubs or organizations: Not on file    Relationship status: Not on file  Other Topics Concern  . Not on file  Social History Narrative  . Not on file   Allergies  Allergen Reactions  . Lisinopril     REACTION: unspecified  . Sulfamethoxazole-Trimethoprim     REACTION: unspecified   Family History  Problem Relation Age of Onset  . Diabetes Paternal Grandmother   . Diabetes Paternal Grandfather   . Colon cancer Neg  Hx   . Colon polyps Neg Hx      Past medical history, social, surgical and family history all reviewed in electronic medical record.  No pertanent information unless stated regarding to the chief complaint.   Review of Systems:Review of systems updated and as accurate as of 02/04/18  No headache, visual changes, nausea, vomiting, diarrhea, constipation, dizziness, abdominal pain, skin rash, fevers, chills, night sweats, weight loss, swollen lymph nodes, body aches, joint swelling, chest pain, shortness of breath, mood changes.  Positive muscle aches  Objective  Blood pressure 118/90, pulse 72, height 5\' 3"  (1.6 m), weight 133 lb (60.3 kg), SpO2 98 %. Systems examined below as of 02/04/18   General: No apparent distress alert and oriented x3 mood and affect normal, dressed appropriately.  HEENT: Pupils equal, extraocular movements intact  Respiratory: Patient's speak in full sentences and does not appear short of breath  Cardiovascular: No lower extremity edema, non tender,  no erythema  Skin: Warm dry intact with no signs of infection or rash on extremities or on axial skeleton.  Abdomen: Soft nontender  Neuro: Cranial nerves II through XII are intact, neurovascularly intact in all extremities with 2+ DTRs and 2+ pulses.  Lymph: No lymphadenopathy of posterior or anterior cervical chain or axillae bilaterally.  Gait normal with good balance and coordination.  MSK:  Non tender with full range of motion and good stability and symmetric strength and tone of shoulders, elbows, wrist, hip, knee and ankles bilaterally.  Back Exam:  Inspection: Mild loss of lordosis and degenerative scoliosis Motion: Flexion 35 deg, Extension 25 deg, Side Bending to 45 deg bilaterally,  Rotation to 45 deg bilaterally  SLR laying: Negative  XSLR laying: Negative  Palpable tenderness: N tenderness over the left paraspinal musculature in the left gluteal tendon pain also over the left abductor FABER: negative. Sensory change: Gross sensation intact to all lumbar and sacral dermatomes.  Reflexes: 2+ at both patellar tendons, 2+ at achilles tendons, Babinski's downgoing.  Strength at foot  Plantar-flexion: 5/5 Dorsi-flexion: 5/5 Eversion: 5/5 Inversion: 5/5  Leg strength  Quad: 5/5 Hamstring: 5/5 Hip flexor: 5/5 Hip abductors: 4/5 but symmetric Gait unremarkable.  [id number]; 15 additional minutes spent for Therapeutic exercises as stated in above notes.  This included exercises focusing on stretching, strengthening, with significant focus on eccentric aspects.   Long term goals include an improvement in range of motion, strength, endurance as well as avoiding reinjury. Patient's frequency would include in 1-2 times a day, 3-5 times a week for a duration of 6-12 weeks. Low back exercises that included:  Pelvic tilt/bracing instruction to focus on control of the pelvic girdle and lower abdominal muscles  Glute strengthening exercises, focusing on proper firing of the glutes without engaging the  low back muscles Proper stretching techniques for maximum relief for the hamstrings, hip flexors, low back and some rotation where tolerated   Proper technique shown and discussed handout in great detail with ATC.  All questions were discussed and answered.     Impression and Recommendations:     This case required medical decision making of moderate complexity.      Note: This dictation was prepared with Dragon dictation along with smaller phrase technology. Any transcriptional errors that result from this process are unintentional.

## 2018-02-04 NOTE — Patient Instructions (Signed)
Good to see you  More of a adductor strain  Xray downstairs Decrease distance by 50% this week, then add 25% next week then add 25% the week after that  Thigh compression sleeve with running and working out.  Ice 20 minutes 2 times daily. Usually after activity and before bed. Vitamin D 2000-4000IU daily  B12 1079mcg daily  Tart cherry extract any dose at night See me again in 3-4 weeks to make sure doing better

## 2018-02-07 LAB — AFP TUMOR MARKER: AFP-Tumor Marker: 4.6 ng/mL

## 2018-02-08 ENCOUNTER — Ambulatory Visit (HOSPITAL_COMMUNITY): Payer: 59

## 2018-02-09 ENCOUNTER — Ambulatory Visit (HOSPITAL_COMMUNITY)
Admission: RE | Admit: 2018-02-09 | Discharge: 2018-02-09 | Disposition: A | Discharge status: Home / Self Care | Payer: 59 | Source: Ambulatory Visit | Attending: Gastroenterology | Admitting: Gastroenterology

## 2018-02-09 DIAGNOSIS — K7460 Unspecified cirrhosis of liver: Secondary | ICD-10-CM | POA: Diagnosis not present

## 2018-02-14 MED FILL — FLUTICASONE PROP 50 MCG SPR: 50 | 30 days supply | Qty: 16 | Fill #4

## 2018-02-14 MED FILL — VENTOLIN HFA 90 MCG INHALER: 108 (90 BAS | 50 days supply | Qty: 54 | Fill #1

## 2018-02-16 ENCOUNTER — Encounter (INDEPENDENT_AMBULATORY_CARE_PROVIDER_SITE_OTHER): Payer: 59 | Admitting: Ophthalmology

## 2018-02-16 DIAGNOSIS — H2513 Age-related nuclear cataract, bilateral: Secondary | ICD-10-CM

## 2018-02-16 DIAGNOSIS — H3553 Other dystrophies primarily involving the sensory retina: Secondary | ICD-10-CM | POA: Diagnosis not present

## 2018-02-16 DIAGNOSIS — H43813 Vitreous degeneration, bilateral: Secondary | ICD-10-CM

## 2018-02-23 MED FILL — FUROSEMIDE 40 MG TAB: 40 | 30 days supply | Qty: 30 | Fill #1

## 2018-02-24 NOTE — Progress Notes (Signed)
[facility] [address] [place], [place] [address] Phone: [phone] Subjective:    CC: Hip pain  UVO:[id number]  [name] [name] is a 62 y.o. female coming in with complaint of hip pain. She feels that her pain has improved. She cut back her mileage and has slowly increased it over the past few weeks. She has some pain over the IT band on the left leg but otherwise is not feeling pain. Has been using tart cherry extract and vitamin d. 90% better   ready to increase activity      Past Medical History:  Diagnosis Date  . Allergy    mild  . Arthritis   . Asthma   . Cancer [facility])    bladder cancer  . Cirrhosis, alcoholic ([place])   . Esophageal varices ([place])   . GERD (gastroesophageal reflux disease)   . Hypertension   . Hyponatremia   . Psoriasis    Past Surgical History:  Procedure Laterality Date  . BLADDER SURGERY     Tumor removed   . BREAST REDUCTION SURGERY     Bilateral   . COLONOSCOPY    . REDUCTION MAMMAPLASTY     Social History   Socioeconomic History  . Marital status: Married    Spouse name: Not on file  . Number of children: Not on file  . Years of education: Not on file  . Highest education level: Not on file  Occupational History  . Not on file  Social Needs  . Financial resource strain: Not on file  . Food insecurity:    Worry: Not on file    Inability: Not on file  . Transportation needs:    Medical: Not on file    Non-medical: Not on file  Tobacco Use  . Smoking status: Never Smoker  . Smokeless tobacco: Never Used  Substance and Sexual Activity  . Alcohol use: No  . Drug use: No  . Sexual activity: Not on file  Lifestyle  . Physical activity:    Days per week: Not on file    Minutes per session: Not on file  . Stress: Not on file  Relationships  . Social connections:    Talks on phone: Not on file    Gets together: Not on file    Attends religious service: Not on file    Active member of club or  organization: Not on file    Attends meetings of clubs or organizations: Not on file    Relationship status: Not on file  Other Topics Concern  . Not on file  Social History Narrative  . Not on file   Allergies  Allergen Reactions  . Lisinopril     REACTION: unspecified  . Sulfamethoxazole-Trimethoprim     REACTION: unspecified   Family History  Problem Relation Age of Onset  . Diabetes Paternal Grandmother   . Diabetes Paternal Grandfather   . Colon cancer Neg Hx   . Colon polyps Neg Hx      Past medical history, social, surgical and family history all reviewed in electronic medical record.  No pertanent information unless stated regarding to the chief complaint.   Review of Systems:Review of systems updated and as accurate as of 02/25/18  No headache, visual changes, nausea, vomiting, diarrhea, constipation, dizziness, abdominal pain, skin rash, fevers, chills, night sweats, weight loss, swollen lymph nodes, body aches, joint swelling,chest pain, shortness of breath, mood changes.   Objective  Blood pressure (!) 128/98,  pulse 61, height 5\' 3"  (1.6 m), weight 134 lb (60.8 kg), SpO2 98 %. Systems examined below as of 02/25/18   General: No apparent distress alert and oriented x3 mood and affect normal, dressed appropriately.  HEENT: Pupils equal, extraocular movements intact  Respiratory: Patient's speak in full sentences and does not appear short of breath  Cardiovascular: No lower extremity edema, non tender, no erythema  Skin: Warm dry intact with no signs of infection or rash on extremities or on axial skeleton.  Abdomen: Soft nontender  Neuro: Cranial nerves II through XII are intact, neurovascularly intact in all extremities with 2+ DTRs and 2+ pulses.  Lymph: No lymphadenopathy of posterior or anterior cervical chain or axillae bilaterally.  Gait normal with good balance and coordination.  MSK:  Non tender with full range of motion and good stability and symmetric  strength and tone of shoulders, elbows, wrist,  knee and ankles bilaterally.  Low back exam does show tightness.  Mild pain over the sacroiliac joints bilaterally.  Patient's hip exam shows mild limited range of motion in internal and external.  Tightness with [facility] test bilaterally.  Negative straight leg test.  Neurovascular intact distally.  Strength 5 out of 5 bilateral lower extremities and symmetric     Impression and Recommendations:     This case required medical decision making of moderate complexity.      Note: This dictation was prepared with Dragon dictation along with smaller phrase technology. Any transcriptional errors that result from this process are unintentional.

## 2018-02-25 ENCOUNTER — Encounter: Payer: Self-pay | Admitting: Family Medicine

## 2018-02-25 ENCOUNTER — Ambulatory Visit: Payer: 59 | Admitting: Family Medicine

## 2018-02-25 DIAGNOSIS — S76212D Strain of adductor muscle, fascia and tendon of left thigh, subsequent encounter: Secondary | ICD-10-CM | POA: Diagnosis not present

## 2018-02-25 NOTE — Patient Instructions (Signed)
Good to see you  [name] [name] is your friend.  Stay active OK to do 12 this week then back on schedule really  Stay on home exercises 1-2 times a week  See me again in 6 weeks to make sure you are good to go!

## 2018-02-25 NOTE — Assessment & Plan Note (Signed)
Patient is doing very well.  Has the underlying cirrhosis.  We discussed with him potentially avoid.  We discussed what to doing which wants to avoid.  Discussed icing regimen.  Follow-up again in 4 to 8 weeks

## 2018-03-10 ENCOUNTER — Other Ambulatory Visit: Payer: Self-pay | Admitting: Gastroenterology

## 2018-03-10 MED FILL — DICLOFENAC SODIUM 75 MG TAB: 75 | 84 days supply | Qty: 36 | Fill #4

## 2018-03-10 MED FILL — SPIRONOLACTONE 50 MG TAB: 50 | 30 days supply | Qty: 30 | Fill #0

## 2018-03-21 MED FILL — FLUTICASONE PROP 50 MCG SPR: 50 | 30 days supply | Qty: 16 | Fill #5

## 2018-03-24 MED FILL — AMLODIPINE BESYLATE 5 MG TA: 5 | 90 days supply | Qty: 90 | Fill #3

## 2018-03-24 MED FILL — FUROSEMIDE 40 MG TAB: 40 | 90 days supply | Qty: 90 | Fill #2

## 2018-04-06 MED FILL — SPIRONOLACTONE 50 MG TABLET: 50 | 30 days supply | Qty: 30 | Fill #1

## 2018-04-11 NOTE — Progress Notes (Signed)
[facility] [place], [place] [address] Phone: [phone] Subjective:    I Kandace Blitz am serving as a Education administrator for Dr. Hulan Saas.   CC: Hip pain follow-up  YQI:[id number]  [name] [name] is a 62 y.o. female coming in with complaint of leg pain. States that her leg is doing better. Has a marathon Sunday. States that she just wants to talk about the race. Recently tripped on the side walk twice. Has a bruise on her hip.  This is on the left hip.  Patient's right groin pulmo-seems to be 95% better.  Notices some mild soreness but nothing severe.  Patient states has been training.  Longest run she has made is 18-1/2 miles.     Past Medical History:  Diagnosis Date  . Allergy    mild  . Arthritis   . Asthma   . Cancer [facility])    bladder cancer  . Cirrhosis, alcoholic ([place])   . Esophageal varices ([place])   . GERD (gastroesophageal reflux disease)   . Hypertension   . Hyponatremia   . Psoriasis    Past Surgical History:  Procedure Laterality Date  . BLADDER SURGERY     Tumor removed   . BREAST REDUCTION SURGERY     Bilateral   . COLONOSCOPY    . REDUCTION MAMMAPLASTY     Social History   Socioeconomic History  . Marital status: Married    Spouse name: Not on file  . Number of children: Not on file  . Years of education: Not on file  . Highest education level: Not on file  Occupational History  . Not on file  Social Needs  . Financial resource strain: Not on file  . Food insecurity:    Worry: Not on file    Inability: Not on file  . Transportation needs:    Medical: Not on file    Non-medical: Not on file  Tobacco Use  . Smoking status: Never Smoker  . Smokeless tobacco: Never Used  Substance and Sexual Activity  . Alcohol use: No  . Drug use: No  . Sexual activity: Not on file  Lifestyle  . Physical activity:    Days per week: Not on file    Minutes per session: Not on file  . Stress: Not on file    Relationships  . Social connections:    Talks on phone: Not on file    Gets together: Not on file    Attends religious service: Not on file    Active member of club or organization: Not on file    Attends meetings of clubs or organizations: Not on file    Relationship status: Not on file  Other Topics Concern  . Not on file  Social History Narrative  . Not on file   Allergies  Allergen Reactions  . Lisinopril     REACTION: unspecified  . Sulfamethoxazole-Trimethoprim     REACTION: unspecified   Family History  Problem Relation Age of Onset  . Diabetes Paternal Grandmother   . Diabetes Paternal Grandfather   . Colon cancer Neg Hx   . Colon polyps Neg Hx      Current Outpatient Medications (Cardiovascular):  .  amLODipine (NORVASC) 5 MG tablet, Take 5 mg by mouth daily. .  furosemide (LASIX) 40 MG tablet, TAKE 1 TABLET BY MOUTH ONCE DAILY .  spironolactone (ALDACTONE) 50 MG tablet, TAKE 1 TABLET BY MOUTH ONCE DAILY  Current Outpatient  Medications (Respiratory):  .  albuterol (PROVENTIL HFA;VENTOLIN HFA) 108 (90 BASE) MCG/ACT inhaler, Inhale 2 puffs into the lungs every 6 (six) hours as needed for wheezing or shortness of breath.  Current Outpatient Medications (Analgesics):  .  diclofenac (VOLTAREN) 75 MG EC tablet, Take 75 mg by mouth daily.    Current Outpatient Medications (Other):  .  b complex vitamins tablet, Take 1 tablet by mouth daily.   .  LUTEIN-ZEAXANTHIN PO, Take by mouth daily. Marland Kitchen  ofloxacin (OCUFLOX) 0.3 % ophthalmic solution, Place 1 drop into both eyes 4 (four) times daily. For 5 days. .  Prenatal Multivit-Min-Fe-FA (PRE-NATAL FORMULA) TABS, Take 1 tablet by mouth daily.  .  Probiotic Product (PROBIOTIC DAILY PO), Take by mouth daily.    Past medical history, social, surgical and family history all reviewed in electronic medical record.  No pertanent information unless stated regarding to the chief complaint.   Review of Systems:  No headache,  visual changes, nausea, vomiting, diarrhea, constipation, dizziness, abdominal pain, skin rash, fevers, chills, night sweats, weight loss, swollen lymph nodes, body aches, joint swelling, chest pain, shortness of breath, mood changes.  Positive muscle aches  Objective  Blood pressure 136/78, pulse 70, height 5\' 3"  (1.6 m), weight 133 lb (60.3 kg), SpO2 98 %.    General: No apparent distress alert and oriented x3 mood and affect normal, dressed appropriately.  HEENT: Pupils equal, extraocular movements intact  Respiratory: Patient's speak in full sentences and does not appear short of breath  Cardiovascular: No lower extremity edema, non tender, no erythema  Skin: Warm dry intact with no signs of infection or rash on extremities or on axial skeleton.  Abdomen: Soft nontender  Neuro: Cranial nerves II through XII are intact, neurovascularly intact in all extremities with 2+ DTRs and 2+ pulses.  Lymph: No lymphadenopathy of posterior or anterior cervical chain or axillae bilaterally.  Gait normal with good balance and coordination.  MSK:  Non tender with full range of motion and good stability and symmetric strength and tone of shoulders, elbows, wrist, , knee and ankles bilaterally.  Patient's right hip exam has near full range of motion.  Still some limited internal range of motion on the right compared to left.  Left hip though does have a lateral bruise noted. Tender to palpation.  No hematoma noted.  Negative straight leg test bilaterally.    Impression and Recommendations:     This case required medical decision making of moderate complexity. The above documentation has been reviewed and is accurate and complete [name] Pulley, DO       Note: This dictation was prepared with Dragon dictation along with smaller phrase technology. Any transcriptional errors that result from this process are unintentional.

## 2018-04-12 ENCOUNTER — Encounter: Payer: Self-pay | Admitting: Family Medicine

## 2018-04-12 ENCOUNTER — Ambulatory Visit: Payer: 59 | Admitting: Family Medicine

## 2018-04-12 DIAGNOSIS — S7002XA Contusion of left hip, initial encounter: Secondary | ICD-10-CM | POA: Insufficient documentation

## 2018-04-12 DIAGNOSIS — S76212D Strain of adductor muscle, fascia and tendon of left thigh, subsequent encounter: Secondary | ICD-10-CM | POA: Diagnosis not present

## 2018-04-12 NOTE — Patient Instructions (Addendum)
Good to see you  Ice is your friend Stay active Have a great race You will do well  Send me a message after the race to tell me how it went Arnica lotion 2 times a day to the bruise and warm compresses can help  See me again when you need me

## 2018-04-12 NOTE — Assessment & Plan Note (Signed)
Patient does have a hematoma on the left hip.  We discussed compression, over-the-counter medications, avoiding blood thinners when possible.  Follow-up again if no improvement in 3 to 4 weeks

## 2018-04-12 NOTE — Assessment & Plan Note (Signed)
Patient does have a strain of the abductor noted but it is improved significantly.  Patient has been working on a regular basis.  I believe the patient will do well.  Discussed the possibility of continuing the compression when she is working out to otherwise follow-up as needed

## 2018-04-29 MED FILL — FLUTICASONE PROP 50 MCG SPR: 50 | 30 days supply | Qty: 16 | Fill #6

## 2018-05-05 MED FILL — SPIRONOLACTONE 50 MG TABLET: 50 | 30 days supply | Qty: 30 | Fill #2

## 2018-06-01 MED FILL — FLUTICASONE PROP 50 MCG SPR: 50 | 30 days supply | Qty: 16 | Fill #7

## 2018-06-09 MED FILL — SPIRONOLACTONE 50 MG TABLET: 50 | 30 days supply | Qty: 30 | Fill #3

## 2018-06-09 MED FILL — DICLOFENAC SODIUM 75 MG TAB: 75 | 84 days supply | Qty: 36 | Fill #0

## 2018-06-23 MED FILL — FUROSEMIDE 40 MG TAB: 40 | 90 days supply | Qty: 90 | Fill #3

## 2018-06-24 MED FILL — AMLODIPINE BESYLATE 5 MG TA: 5 | 90 days supply | Qty: 90 | Fill #0

## 2018-07-07 MED FILL — SPIRONOLACTONE 50 MG TABS: 50 | 30 days supply | Qty: 30 | Fill #4

## 2018-07-12 MED FILL — FLUTICASONE PROP 50 MCG SPR: 50 | 30 days supply | Qty: 16 | Fill #8

## 2018-07-19 DIAGNOSIS — D2271 Melanocytic nevi of right lower limb, including hip: Secondary | ICD-10-CM | POA: Diagnosis not present

## 2018-07-19 DIAGNOSIS — L820 Inflamed seborrheic keratosis: Secondary | ICD-10-CM | POA: Diagnosis not present

## 2018-07-19 DIAGNOSIS — D485 Neoplasm of uncertain behavior of skin: Secondary | ICD-10-CM | POA: Diagnosis not present

## 2018-07-19 DIAGNOSIS — L821 Other seborrheic keratosis: Secondary | ICD-10-CM | POA: Diagnosis not present

## 2018-07-19 DIAGNOSIS — Z85828 Personal history of other malignant neoplasm of skin: Secondary | ICD-10-CM | POA: Diagnosis not present

## 2018-08-02 DIAGNOSIS — H8101 Meniere's disease, right ear: Secondary | ICD-10-CM | POA: Diagnosis not present

## 2018-08-02 DIAGNOSIS — H9041 Sensorineural hearing loss, unilateral, right ear, with unrestricted hearing on the contralateral side: Secondary | ICD-10-CM | POA: Diagnosis not present

## 2018-08-04 MED FILL — SPIRONOLACTONE 50 MG TABS: 50 | 30 days supply | Qty: 30 | Fill #5

## 2018-08-11 ENCOUNTER — Encounter: Payer: Self-pay | Admitting: Gastroenterology

## 2018-08-15 ENCOUNTER — Telehealth: Payer: Self-pay

## 2018-08-15 DIAGNOSIS — K7460 Unspecified cirrhosis of liver: Secondary | ICD-10-CM

## 2018-08-15 NOTE — Telephone Encounter (Signed)
I have added the orders in Epic and sent the pt a My Chart message to let me know if I can set up [place].

## 2018-08-15 NOTE — Telephone Encounter (Signed)
-----   Message from Timothy Lasso, RN sent at 02/10/2018  2:22 PM EDT ----- repeat AFP and [place] in 6 months

## 2018-08-18 ENCOUNTER — Encounter: Payer: Self-pay | Admitting: Gastroenterology

## 2018-08-19 ENCOUNTER — Encounter (INDEPENDENT_AMBULATORY_CARE_PROVIDER_SITE_OTHER): Payer: 59 | Admitting: Ophthalmology

## 2018-08-19 DIAGNOSIS — H2513 Age-related nuclear cataract, bilateral: Secondary | ICD-10-CM | POA: Diagnosis not present

## 2018-08-19 DIAGNOSIS — H35033 Hypertensive retinopathy, bilateral: Secondary | ICD-10-CM | POA: Diagnosis not present

## 2018-08-19 DIAGNOSIS — H43813 Vitreous degeneration, bilateral: Secondary | ICD-10-CM

## 2018-08-19 DIAGNOSIS — H3553 Other dystrophies primarily involving the sensory retina: Secondary | ICD-10-CM | POA: Diagnosis not present

## 2018-08-19 DIAGNOSIS — I10 Essential (primary) hypertension: Secondary | ICD-10-CM

## 2018-08-19 MED FILL — FLUTICASONE PROP 50 MCG SPR: 50 | 30 days supply | Qty: 16 | Fill #9 | Status: TO

## 2018-08-21 ENCOUNTER — Ambulatory Visit (INDEPENDENT_AMBULATORY_CARE_PROVIDER_SITE_OTHER): Payer: Self-pay | Admitting: Nurse Practitioner

## 2018-08-21 VITALS — BP 122/80 | HR 77 | Temp 98.6°F | Resp 18 | Wt 134.0 lb

## 2018-08-21 DIAGNOSIS — B9689 Other specified bacterial agents as the cause of diseases classified elsewhere: Secondary | ICD-10-CM

## 2018-08-21 DIAGNOSIS — J209 Acute bronchitis, unspecified: Secondary | ICD-10-CM

## 2018-08-21 DIAGNOSIS — J019 Acute sinusitis, unspecified: Secondary | ICD-10-CM

## 2018-08-21 MED ORDER — PREDNISONE 10 MG (21) PO TBPK: ORAL_TABLET | ORAL | 0 refills | Status: AC

## 2018-08-21 MED ORDER — DOXYCYCLINE HYCLATE 100 MG PO TABS: 100.0000 mg | ORAL_TABLET | Freq: Two times a day (BID) | ORAL | 0 refills | Status: AC

## 2018-08-21 NOTE — Progress Notes (Signed)
Subjective:  [name] [name] is a 63 y.o. female who presents for evaluation of possible sinusitis.  Symptoms include headache described as "fullness", nasal congestion, no fever, post nasal drip, productive cough with yellow and green colored sputum, shortness of breath and sore throat.  The patient also endorses wheezing, decreased appetite and increased fatigue.  The patient denies fever, chills, body aches, ear pain, ear drainage, abdominal pain, nausea, or vomiting.  Onset of symptoms was 2 weeks ago, and has been gradually worsening since that time.  Treatment to date:  none.  High risk factors for influenza complications:  co-morbid illness and Patient has a history of asthma, HTN and alcoholic cirrhosis.  The following portions of the patient's history were reviewed and updated as appropriate:  allergies, current medications and past medical history.  Past Medical History:  Diagnosis Date  . Allergy    mild  . Arthritis   . Asthma   . Cancer [facility])    bladder cancer  . Cirrhosis, alcoholic ([place])   . Esophageal varices ([place])   . GERD (gastroesophageal reflux disease)   . Hypertension   . Hyponatremia   . Psoriasis     Constitutional: positive for anorexia and fatigue, negative for chills, fevers, malaise and sweats Eyes: negative Ears, nose, mouth, throat, and face: positive for nasal congestion and sore throat, negative for ear drainage, earaches and hoarseness Respiratory: positive for asthma, cough, dyspnea on exertion, sputum and wheezing, negative for chronic bronchitis, emphysema, hemoptysis and pleurisy/chest pain Cardiovascular: negative Gastrointestinal: positive for Decreased appetite, negative for abdominal pain, constipation, diarrhea, nausea and vomiting Neurological: positive for headaches, negative for coordination problems, dizziness, paresthesia, vertigo and weakness   Objective:  BP 122/80 (BP Location: Right Arm, Patient Position: Sitting, Cuff Size: Normal)    Pulse 77   Temp 98.6 F (37 C) (Oral)   Resp 18   Wt 134 lb (60.8 kg)   SpO2 95%   BMI 23.74 kg/m  Physical Exam Vitals signs reviewed.  Constitutional:      General: She is not in acute distress. HENT:     Head: Normocephalic.     Right Ear: Tympanic membrane, ear canal and external ear normal.     Left Ear: Tympanic membrane, ear canal and external ear normal.     Nose: Mucosal edema, congestion ( Moderate) and rhinorrhea ( Clear) present.     Right Turbinates: Enlarged and swollen.     Left Turbinates: Enlarged and swollen.     Right Sinus: Maxillary sinus tenderness present. No frontal sinus tenderness.     Left Sinus: Maxillary sinus tenderness present. No frontal sinus tenderness.     Mouth/Throat:     Mouth: Mucous membranes are moist.     Pharynx: Oropharynx is clear. Uvula midline. Posterior oropharyngeal erythema present. No pharyngeal swelling, oropharyngeal exudate or uvula swelling.     Tonsils: No tonsillar exudate or tonsillar abscesses. Swelling: 0 on the right. 0 on the left.  Eyes:     Pupils: Pupils are equal, round, and reactive to light.  Neck:     Musculoskeletal: Normal range of motion and neck supple.  Cardiovascular:     Rate and Rhythm: Normal rate and regular rhythm.     Pulses: Normal pulses.     Heart sounds: Normal heart sounds.  Pulmonary:     Effort: Pulmonary effort is normal. No respiratory distress.     Breath sounds: Normal breath sounds. No stridor. No wheezing, rhonchi or rales.  Abdominal:  General: Abdomen is flat. Bowel sounds are normal.     Tenderness: There is no abdominal tenderness.  Lymphadenopathy:     Cervical: No cervical adenopathy.  Skin:    General: Skin is warm and dry.     Capillary Refill: Capillary refill takes less than 2 seconds.  Neurological:     General: No focal deficit present.     Mental Status: She is alert and oriented to person, place, and time.     Cranial Nerves: No cranial nerve deficit.   Psychiatric:        Mood and Affect: Mood normal.        Thought Content: Thought content normal.      Assessment:  Acute Bronchitis Acute Bacterial Maxillary Sinusitis   Plan:   Exam findings, diagnosis etiology and medication use and indications reviewed with patient. Follow- Up and discharge instructions provided. No emergent/urgent issues found on exam.  Based on the patient's clinical presentation, symptoms, physical exam, and duration of symptoms, there is concern for bacterial etiology.  Patient has been coughing for the last [redacted] weeks along with sinus symptoms, so I will cover her with doxycycline that should cover bacterial etiology in the lungs and sinuses.  We will also put the patient on a Sterapred Dosepak to help with her inflammation in her lungs.  Also I am going to treat the patient due to her underlying history of asthma and cirrhosis as I do not want this to develop into occult infection such as pneumonia or any other respiratory complication.  I have instructed the patient that if her symptoms do not improve after this treatment regimen, I would like her to follow-up with her PCP as she may need a chest x-ray at that time.  Patient's vitals are stable at this time, lungs CTAB, and patient is in no acute distress.  Patient okay to be discharged to home patient education was provided. Patient verbalized understanding of information provided and agrees with plan of care (POC), all questions answered. The patient is advised to call or return to clinic if condition does not see an improvement in symptoms, or to seek the care of the closest emergency department if condition worsens with the above plan.   1. Acute bronchitis, unspecified organism  - doxycycline (VIBRA-TABS) 100 MG tablet; Take 1 tablet (100 mg total) by mouth 2 (two) times daily for 7 days.  Dispense: 14 tablet; Refill: 0 - predniSONE (STERAPRED UNI-PAK 21 TAB) 10 MG (21) TBPK tablet; Take as directed.  Dispense: 21  tablet; Refill: 0 -Take medication as prescribed. -Use your Albuterol inhaler every 6 hours as needed for cough, wheezing or shortness of breath. -Ibuprofen or Tylenol for pain, fever, or general discomfort. -Increase fluids. -Get plenty of rest. -Sleep elevated on at least 2 pillows at bedtime to help with cough. -Use a humidifier or vaporizer when at home and during sleep to help with cough. -May use a teaspoon of honey or over-the-counter cough drops to help with cough. -Follow-up with your PCP if your symptoms do not improve as you may need a chest x-ray at that time.  2. Acute bacterial sinusitis  - doxycycline (VIBRA-TABS) 100 MG tablet; Take 1 tablet (100 mg total) by mouth 2 (two) times daily for 7 days.  Dispense: 14 tablet; Refill: 0 -Take medication as prescribed. -Use your Albuterol inhaler every 6 hours as needed for cough, wheezing or shortness of breath. -Ibuprofen or Tylenol for pain, fever, or general discomfort. -Increase fluids. -Get  plenty of rest. -Sleep elevated on at least 2 pillows at bedtime to help with cough. -Use a humidifier or vaporizer when at home and during sleep to help with cough. -May use a teaspoon of honey or over-the-counter cough drops to help with cough. -Follow-up with your PCP if your symptoms do not improve as you may need a chest x-ray at that time.

## 2018-08-21 NOTE — Patient Instructions (Signed)
Acute Bronchitis, Adult -Take medication as prescribed. -Use your Albuterol inhaler every 6 hours as needed for cough, wheezing or shortness of breath. -Ibuprofen or Tylenol for pain, fever, or general discomfort. -Increase fluids. -Get plenty of rest. -Sleep elevated on at least 2 pillows at bedtime to help with cough. -Use a humidifier or vaporizer when at home and during sleep to help with cough. -May use a teaspoon of honey or over-the-counter cough drops to help with cough. -Follow-up with your PCP if your symptoms do not improve as you may need a chest x-ray at that time.  Acute bronchitis is sudden (acute) swelling of the air tubes (bronchi) in the lungs. Acute bronchitis causes these tubes to fill with mucus, which can make it hard to breathe. It can also cause coughing or wheezing. In adults, acute bronchitis usually goes away within 2 weeks. A cough caused by bronchitis may last up to 3 weeks. Smoking, allergies, and asthma can make the condition worse. Repeated episodes of bronchitis may cause further lung problems, such as chronic obstructive pulmonary disease (COPD). What are the causes? This condition can be caused by germs and by substances that irritate the lungs, including:  Cold and flu viruses. This condition is most often caused by the same virus that causes a cold.  Bacteria.  Exposure to tobacco smoke, dust, fumes, and air pollution. What increases the risk? This condition is more likely to develop in people who:  Have close contact with someone with acute bronchitis.  Are exposed to lung irritants, such as tobacco smoke, dust, fumes, and vapors.  Have a weak immune system.  Have a respiratory condition such as asthma. What are the signs or symptoms? Symptoms of this condition include:  A cough.  Coughing up clear, yellow, or green mucus.  Wheezing.  Chest congestion.  Shortness of breath.  A fever.  Body aches.  Chills.  A sore throat. How is  this diagnosed? This condition is usually diagnosed with a physical exam. During the exam, your health care provider may order tests, such as chest X-rays, to rule out other conditions. He or she may also:  Test a sample of your mucus for bacterial infection.  Check the level of oxygen in your blood. This is done to check for pneumonia.  Do a chest X-ray or lung function testing to rule out pneumonia and other conditions.  Perform blood tests. Your health care provider will also ask about your symptoms and medical history. How is this treated? Most cases of acute bronchitis clear up over time without treatment. Your health care provider may recommend:  Drinking more fluids. Drinking more makes your mucus thinner, which may make it easier to breathe.  Taking a medicine for a fever or cough.  Taking an antibiotic medicine.  Using an inhaler to help improve shortness of breath and to control a cough.  Using a cool mist vaporizer or humidifier to make it easier to breathe. Follow these instructions at home: Medicines  Take over-the-counter and prescription medicines only as told by your health care provider.  If you were prescribed an antibiotic, take it as told by your health care provider. Do not stop taking the antibiotic even if you start to feel better. General instructions   Get plenty of rest.  Drink enough fluids to keep your urine pale yellow.  Avoid smoking and secondhand smoke. Exposure to cigarette smoke or irritating chemicals will make bronchitis worse. If you smoke and you need help quitting, ask your  health care provider. Quitting smoking will help your lungs heal faster.  Use an inhaler, cool mist vaporizer, or humidifier as told by your health care provider.  Keep all follow-up visits as told by your health care provider. This is important. How is this prevented? To lower your risk of getting this condition again:  Wash your hands often with soap and water.  If soap and water are not available, use hand sanitizer.  Avoid contact with people who have cold symptoms.  Try not to touch your hands to your mouth, nose, or eyes.  Make sure to get the flu shot every year. Contact a health care provider if:  Your symptoms do not improve in 2 weeks of treatment. Get help right away if:  You cough up blood.  You have chest pain.  You have severe shortness of breath.  You become dehydrated.  You faint or keep feeling like you are going to faint.  You keep vomiting.  You have a severe headache.  Your fever or chills gets worse. This information is not intended to replace advice given to you by your health care provider. Make sure you discuss any questions you have with your health care provider. Document Released: 07/30/2004 Document Revised: 02/03/2017 Document Reviewed: 12/11/2015 Elsevier Interactive Patient Education  2019 [employer]  Sinusitis, Adult Sinusitis is inflammation of your sinuses. Sinuses are hollow spaces in the bones around your face. Your sinuses are located:  Around your eyes.  In the middle of your forehead.  Behind your nose.  In your cheekbones. Mucus normally drains out of your sinuses. When your nasal tissues become inflamed or swollen, mucus can become trapped or blocked. This allows bacteria, viruses, and fungi to grow, which leads to infection. Most infections of the sinuses are caused by a virus. Sinusitis can develop quickly. It can last for up to 4 weeks (acute) or for more than 12 weeks (chronic). Sinusitis often develops after a cold. What are the causes? This condition is caused by anything that creates swelling in the sinuses or stops mucus from draining. This includes:  Allergies.  Asthma.  Infection from bacteria or viruses.  Deformities or blockages in your nose or sinuses.  Abnormal growths in the nose (nasal polyps).  Pollutants, such as chemicals or irritants in the  air.  Infection from fungi (rare). What increases the risk? You are more likely to develop this condition if you:  Have a weak body defense system (immune system).  Do a lot of swimming or diving.  Overuse nasal sprays.  Smoke. What are the signs or symptoms? The main symptoms of this condition are pain and a feeling of pressure around the affected sinuses. Other symptoms include:  Stuffy nose or congestion.  Thick drainage from your nose.  Swelling and warmth over the affected sinuses.  Headache.  Upper toothache.  A cough that may get worse at night.  Extra mucus that collects in the throat or the back of the nose (postnasal drip).  Decreased sense of smell and taste.  Fatigue.  A fever.  Sore throat.  Bad breath. How is this diagnosed? This condition is diagnosed based on:  Your symptoms.  Your medical history.  A physical exam.  Tests to find out if your condition is acute or chronic. This may include: ? Checking your nose for nasal polyps. ? Viewing your sinuses using a device that has a light (endoscope). ? Testing for allergies or bacteria. ? Imaging tests, such as an MRI  or CT scan. In rare cases, a bone biopsy may be done to rule out more serious types of fungal sinus disease. How is this treated? Treatment for sinusitis depends on the cause and whether your condition is chronic or acute.  If caused by a virus, your symptoms should go away on their own within 10 days. You may be given medicines to relieve symptoms. They include: ? Medicines that shrink swollen nasal passages (topical intranasal decongestants). ? Medicines that treat allergies (antihistamines). ? A spray that eases inflammation of the nostrils (topical intranasal corticosteroids). ? Rinses that help get rid of thick mucus in your nose (nasal saline washes).  If caused by bacteria, your health care provider may recommend waiting to see if your symptoms improve. Most bacterial  infections will get better without antibiotic medicine. You may be given antibiotics if you have: ? A severe infection. ? A weak immune system.  If caused by narrow nasal passages or nasal polyps, you may need to have surgery. Follow these instructions at home: Medicines  Take, use, or apply over-the-counter and prescription medicines only as told by your health care provider. These may include nasal sprays.  If you were prescribed an antibiotic medicine, take it as told by your health care provider. Do not stop taking the antibiotic even if you start to feel better. Hydrate and humidify   Drink enough fluid to keep your urine pale yellow. Staying hydrated will help to thin your mucus.  Use a cool mist humidifier to keep the humidity level in your home above 50%.  Inhale steam for 10-15 minutes, 3-4 times a day, or as told by your health care provider. You can do this in the bathroom while a hot shower is running.  Limit your exposure to cool or dry air. Rest  Rest as much as possible.  Sleep with your head raised (elevated).  Make sure you get enough sleep each night. General instructions   Apply a warm, moist washcloth to your face 3-4 times a day or as told by your health care provider. This will help with discomfort.  Wash your hands often with soap and water to reduce your exposure to germs. If soap and water are not available, use hand sanitizer.  Do not smoke. Avoid being around people who are smoking (secondhand smoke).  Keep all follow-up visits as told by your health care provider. This is important. Contact a health care provider if:  You have a fever.  Your symptoms get worse.  Your symptoms do not improve within 10 days. Get help right away if:  You have a severe headache.  You have persistent vomiting.  You have severe pain or swelling around your face or eyes.  You have vision problems.  You develop confusion.  Your neck is stiff.  You have  trouble breathing. Summary  Sinusitis is soreness and inflammation of your sinuses. Sinuses are hollow spaces in the bones around your face.  This condition is caused by nasal tissues that become inflamed or swollen. The swelling traps or blocks the flow of mucus. This allows bacteria, viruses, and fungi to grow, which leads to infection.  If you were prescribed an antibiotic medicine, take it as told by your health care provider. Do not stop taking the antibiotic even if you start to feel better.  Keep all follow-up visits as told by your health care provider. This is important. This information is not intended to replace advice given to you by your health care  provider. Make sure you discuss any questions you have with your health care provider. Document Released: 06/22/2005 Document Revised: 11/22/2017 Document Reviewed: 11/22/2017 Elsevier Interactive Patient Education  2019 [employer].

## 2018-09-02 MED FILL — SPIRONOLACTONE 50 MG TABLET: 50 | 30 days supply | Qty: 30 | Fill #6 | Status: TO

## 2018-09-02 MED FILL — DICLOFENAC SODIUM 75 MG TAB: 75 | 84 days supply | Qty: 36 | Fill #1 | Status: TO

## 2018-09-08 ENCOUNTER — Other Ambulatory Visit: Payer: Self-pay | Admitting: Internal Medicine

## 2018-09-08 DIAGNOSIS — Z1231 Encounter for screening mammogram for malignant neoplasm of breast: Secondary | ICD-10-CM

## 2018-09-13 ENCOUNTER — Other Ambulatory Visit: Payer: Self-pay

## 2018-09-13 ENCOUNTER — Other Ambulatory Visit: Payer: 59

## 2018-09-13 ENCOUNTER — Ambulatory Visit (AMBULATORY_SURGERY_CENTER): Payer: Self-pay

## 2018-09-13 VITALS — Ht 63.0 in | Wt 138.4 lb

## 2018-09-13 DIAGNOSIS — K7460 Unspecified cirrhosis of liver: Secondary | ICD-10-CM

## 2018-09-13 NOTE — Progress Notes (Signed)
No egg or soy allergy known to patient  No issues with past sedation with any surgeries  or procedures, no intubation problems  No diet pills per patient No home 02 use per patient  No blood thinners per patient  Pt denies issues with constipation  No A fib or A flutter  EMMI video sent to pt's e mail , declined  

## 2018-09-14 ENCOUNTER — Encounter: Payer: Self-pay | Admitting: Gastroenterology

## 2018-09-14 LAB — AFP TUMOR MARKER: AFP TUMOR MARKER: 5.1 ng/mL

## 2018-09-15 ENCOUNTER — Other Ambulatory Visit: Payer: Self-pay

## 2018-09-15 DIAGNOSIS — K7460 Unspecified cirrhosis of liver: Secondary | ICD-10-CM

## 2018-09-16 MED FILL — FUROSEMIDE 40 MG TAB: 40 | 90 days supply | Qty: 90 | Fill #4

## 2018-09-16 MED FILL — AMLODIPINE BESYLATE 5 MG TA: 5 | 90 days supply | Qty: 90 | Fill #1

## 2018-09-20 ENCOUNTER — Other Ambulatory Visit: Payer: Self-pay

## 2018-09-20 ENCOUNTER — Telehealth: Payer: Self-pay | Admitting: Gastroenterology

## 2018-09-20 ENCOUNTER — Ambulatory Visit (HOSPITAL_COMMUNITY)
Admission: RE | Admit: 2018-09-20 | Discharge: 2018-09-20 | Disposition: A | Discharge status: Home / Self Care | Payer: 59 | Source: Ambulatory Visit | Attending: Gastroenterology | Admitting: Gastroenterology

## 2018-09-20 DIAGNOSIS — K7460 Unspecified cirrhosis of liver: Secondary | ICD-10-CM | POA: Diagnosis not present

## 2018-09-26 ENCOUNTER — Encounter: Payer: 59 | Admitting: Gastroenterology

## 2018-09-27 MED FILL — FLUTICASONE PROP 50 MCG SPR: 50 | 30 days supply | Qty: 16 | Fill #0

## 2018-10-01 MED FILL — SPIRONOLACTONE 50 MG TABS: 50 | 90 days supply | Qty: 90 | Fill #0 | Status: TO

## 2018-10-28 ENCOUNTER — Other Ambulatory Visit (INDEPENDENT_AMBULATORY_CARE_PROVIDER_SITE_OTHER): Payer: 59

## 2018-10-28 ENCOUNTER — Other Ambulatory Visit: Payer: 59

## 2018-10-28 DIAGNOSIS — K7460 Unspecified cirrhosis of liver: Secondary | ICD-10-CM | POA: Diagnosis not present

## 2018-10-28 LAB — PROTIME-INR
INR: 1.2 ratio — ABNORMAL HIGH (ref 0.8–1.0)
Prothrombin Time: 14.5 s — ABNORMAL HIGH (ref 9.6–13.1)

## 2018-10-28 LAB — CBC WITH DIFFERENTIAL/PLATELET
Basophils Absolute: 0.1 10*3/uL (ref 0.0–0.1)
Basophils Relative: 1.1 % (ref 0.0–3.0)
Eosinophils Absolute: 0.1 10*3/uL (ref 0.0–0.7)
Eosinophils Relative: 1.2 % (ref 0.0–5.0)
HCT: 43.2 % (ref 36.0–46.0)
Hemoglobin: 15.2 g/dL — ABNORMAL HIGH (ref 12.0–15.0)
Lymphocytes Relative: 25.2 % (ref 12.0–46.0)
Lymphs Abs: 1.8 10*3/uL (ref 0.7–4.0)
MCHC: 35.2 g/dL (ref 30.0–36.0)
MCV: 95.2 fl (ref 78.0–100.0)
Monocytes Absolute: 0.5 10*3/uL (ref 0.1–1.0)
Monocytes Relative: 6.7 % (ref 3.0–12.0)
Neutro Abs: 4.7 10*3/uL (ref 1.4–7.7)
Neutrophils Relative %: 65.8 % (ref 43.0–77.0)
Platelets: 268 10*3/uL (ref 150.0–400.0)
RBC: 4.54 Mil/uL (ref 3.87–5.11)
RDW: 13.6 % (ref 11.5–15.5)
WBC: 7.1 10*3/uL (ref 4.0–10.5)

## 2018-10-28 LAB — COMPREHENSIVE METABOLIC PANEL
ALT: 29 U/L (ref 0–35)
AST: 35 U/L (ref 0–37)
Albumin: 4.5 g/dL (ref 3.5–5.2)
Alkaline Phosphatase: 127 U/L — ABNORMAL HIGH (ref 39–117)
BUN: 15 mg/dL (ref 6–23)
CO2: 27 mEq/L (ref 19–32)
Calcium: 9.4 mg/dL (ref 8.4–10.5)
Chloride: 101 mEq/L (ref 96–112)
Creatinine, Ser: 0.82 mg/dL (ref 0.40–1.20)
GFR: 70.2 mL/min (ref >60.00)
Glucose, Bld: 106 mg/dL — ABNORMAL HIGH (ref 70–99)
Potassium: 3.6 mEq/L (ref 3.5–5.1)
Sodium: 139 mEq/L (ref 135–145)
Total Bilirubin: 1.2 mg/dL (ref 0.2–1.2)
Total Protein: 7.4 g/dL (ref 6.0–8.3)

## 2018-10-31 MED FILL — FLUTICASONE PROP 50 MCG SPR: 50 | 30 days supply | Qty: 16 | Fill #1 | Status: TO

## 2018-11-01 ENCOUNTER — Other Ambulatory Visit: Payer: Self-pay

## 2018-11-01 ENCOUNTER — Ambulatory Visit (INDEPENDENT_AMBULATORY_CARE_PROVIDER_SITE_OTHER): Payer: 59 | Admitting: Gastroenterology

## 2018-11-01 ENCOUNTER — Encounter: Payer: Self-pay | Admitting: Gastroenterology

## 2018-11-01 VITALS — BP 122/80 | HR 77 | Ht 63.0 in | Wt 135.0 lb

## 2018-11-01 DIAGNOSIS — K7030 Alcoholic cirrhosis of liver without ascites: Secondary | ICD-10-CM

## 2018-11-01 NOTE — Progress Notes (Signed)
Review of pertinent gastrointestinal problems:  1. Alcoholic cirrhosis, extensive, long history of drinking, presented winter, 2008 with jaundice and ascites. Hospitalized April, 2008 for several days. Her most recent imaging was an ultrasound April, 2007 showing an echogenic nodular liver consistent with cirrhosis, no obvious masses. Ascites. In June 2008, difficult to control electrolytes and ascites. Renal consultation with Dr. Marval Regal. The patient is currently on Lasix 80 mg t.i.d. and very low dose aldactone 25 mg once daily with poor control of ascites. Early July 2008, large volume paracentesis with 9 liters of ascites removed (with albumin infusion). Chronic liver disease workup hepatitis A antibody positive (old exposure). Hepatitis B negative, hepatitis C negative; alpha 1 antitrypsin ceruloplasmin, ANA, AMA, antismooth muscle antibody all normal. 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 Started on nadolol 20 mg daily. Summer, 2009 modifying her diuretic regimen to a more Aldactone based regimen.   Most recent EGD:08/2015: normal EGD: no portal hypertension signs; next 08/2018 (delayed due to pandemic)  Was seen at [facility] Dr. Zollie Scale, for pre-transplant workup. Was bumped off transplant list due to bladder tumor diagnosis in 2012.   Most recent MELDUNOS 9 (10/2018 labs)   Most recent AFPnormal, 09/2018  Most recent liver imaging3/2020US showed no discrete liver lesions, + cirrhosis  Hep A immune, immunized for Hep B through work (works at [employer]).  2. Routine risk for colon cancer:04/2007 colonoscopy found no poylps. Colonoscopy 07/2017 Dr. Ardis Hughs one 68mm rectal polyp was not precancerous. Recall recommended at 10 year interval.   This service was provided via virtual visit.  Both audio and visual were used.  The patient was located at home.  I was located in my office.  The patient did consent to this  virtual visit and is aware of possible charges through their insurance for this visit.  The patient is an established patient.  My certified medical assistant, Grace Bushy, contributed to this visit by contacting the patient by phone 1 or 2 business days prior to the appointment and also followed up on the recommendations I made after the visit.  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  She feels great.  Runs a lot.    Takes diclofenac TIW, otherwise avoids NSAIDs.  Avoids excessive salt in her diet.  She ran the [facility] last year; completed in 4;45. Was a chilly day.  Chief complaint is cirrhosis  ROS: complete GI ROS as described in HPI, all other review negative.  Constitutional:  No unintentional weight loss   Past Medical History:  Diagnosis Date  . Allergy    mild  . Arthritis   . Asthma   . [facility])    bladder cancer  . Cirrhosis, alcoholic ([place])   . Esophageal varices ([place])   . GERD (gastroesophageal reflux disease)   . Hypertension   . Hyponatremia   . Psoriasis     Past Surgical History:  Procedure Laterality Date  . BLADDER SURGERY     Tumor removed   . BREAST REDUCTION SURGERY     Bilateral   . COLONOSCOPY    . REDUCTION MAMMAPLASTY    . UPPER GASTROINTESTINAL ENDOSCOPY      Current Outpatient Medications  Medication Sig Dispense Refill  . albuterol (PROVENTIL HFA;VENTOLIN HFA) 108 (90  BASE) MCG/ACT inhaler Inhale 2 puffs into the lungs every 6 (six) hours as needed for wheezing or shortness of breath.    Marland Kitchen amLODipine (NORVASC) 5 MG tablet Take 5 mg by mouth daily.    Marland Kitchen b complex vitamins tablet Take 1 tablet by mouth daily.      . diclofenac (VOLTAREN) 75 MG EC tablet Take 75 mg by mouth every other day.     . fluticasone (FLONASE) 50 MCG/ACT nasal spray     . furosemide (LASIX)  40 MG tablet TAKE 1 TABLET BY MOUTH ONCE DAILY 30 tablet PRN  . LUTEIN-ZEAXANTHIN PO Take by mouth daily.    . Prenatal Multivit-Min-Fe-FA (PRE-NATAL FORMULA) TABS Take 1 tablet by mouth daily.     . Probiotic Product (PROBIOTIC DAILY PO) Take by mouth daily.    Marland Kitchen spironolactone (ALDACTONE) 50 MG tablet TAKE 1 TABLET BY MOUTH ONCE DAILY 30 tablet 12   No current facility-administered medications for this visit.     Allergies as of 11/01/2018 - Review Complete 11/01/2018  Allergen Reaction Noted  . Lisinopril  12/07/2006  . Sulfamethoxazole-trimethoprim  12/07/2006    Family History  Problem Relation Age of Onset  . Diabetes Paternal Grandmother   . Diabetes Paternal Grandfather   . Colon cancer Neg Hx   . Colon polyps Neg Hx   . Esophageal cancer Neg Hx   . Rectal cancer Neg Hx   . Stomach cancer Neg Hx     Social History   Socioeconomic History  . Marital status: Married    Spouse name: Not on file  . Number of children: Not on file  . Years of education: Not on file  . Highest education level: Not on file  Occupational History  . Not on file  Social Needs  . Financial resource strain: Not on file  . Food insecurity:    Worry: Not on file    Inability: Not on file  . Transportation needs:    Medical: Not on file    Non-medical: Not on file  Tobacco Use  . Smoking status: Never Smoker  . Smokeless tobacco: Never Used  Substance and Sexual Activity  . Alcohol use: No  . Drug use: No  . Sexual activity: Not on file  Lifestyle  . Physical activity:    Days per week: Not on file    Minutes per session: Not on file  . Stress: Not on file  Relationships  . Social connections:    Talks on phone: Not on file    Gets together: Not on file    Attends religious service: Not on file    Active member of club or organization: Not on file    Attends meetings of clubs or organizations: Not on file    Relationship status: Not on file  . Intimate partner violence:     Fear of current or ex partner: Not on file    Emotionally abused: Not on file    Physically abused: Not on file    Forced sexual activity: Not on file  Other Topics Concern  . Not on file  Social History Narrative  . Not on file     Physical Exam: Unable to perform because this was a "telemed visit" due to current Covid-19 pandemic  Assessment and plan: 63 y.o. female with cirrhosis alcohol.  She quit alcohol about 10 years ago  She is doing great as usual.  No clinical signs of decompensation.  Recent lab tests show  a meld score of 9.  She was due for upper endoscopy with varices screening a couple months ago but that was postponed due to the pandemic.  We will reschedule it for couple months from now hopefully it would be safer to do by that point.  Either way she will need hepatoma screening again in September with an alpha-fetoprotein and an ultrasound.  I would like to see her hopefully in person in 1 year.  Please see the "Patient Instructions" section for addition details about the plan.  Owens Loffler, MD [facility] 11/01/2018, 1:26 PM

## 2018-11-01 NOTE — Patient Instructions (Addendum)
Return office visit with Dr. Ardis Hughs in 1 year sooner if needed  She needs abdominal ultrasound of the liver and alpha-fetoprotein level September 2020 routine does hepatoma screening.  EGD in 2 months ([place]) for varices screening.  Patient will be notified for the upcoming appointments and labs

## 2018-11-08 ENCOUNTER — Telehealth: Payer: Self-pay | Admitting: *Deleted

## 2018-11-08 NOTE — Telephone Encounter (Signed)
Rescheduled pt for 5/29 @ 8:00.  Reviewed instructions with patient.  She had no questions at this time.

## 2018-11-11 MED FILL — DICLOFENAC SODIUM 75 MG TAB: 75 | 84 days supply | Qty: 36 | Fill #0 | Status: TO

## 2018-11-29 MED FILL — FLUTICASONE PROP 50 MCG SPR: 50 | 30 days supply | Qty: 16 | Fill #0

## 2018-11-30 ENCOUNTER — Telehealth: Payer: Self-pay | Admitting: *Deleted

## 2018-11-30 NOTE — Telephone Encounter (Signed)
No answer for first attempt covid screen. LEft message and will call back. SM

## 2018-11-30 NOTE — Telephone Encounter (Signed)
Covid-19 travel screening questions  Have you traveled in the last 14 days?no If yes where?  Do you now or have you had a fever in the last 14 days?no  Do you have any respiratory symptoms of shortness of breath or cough now or in the last 14 days?no  Do you have a medical history of Congestive Heart Failure?  Do you have a medical history of lung disease?  Do you have any family members or close contacts with diagnosed or suspected Covid-19?no  Pt aware of [facility] policy and will bring a mask with her. SM

## 2018-12-02 ENCOUNTER — Ambulatory Visit (AMBULATORY_SURGERY_CENTER): Payer: 59 | Admitting: Gastroenterology

## 2018-12-02 ENCOUNTER — Encounter: Payer: Self-pay | Admitting: Gastroenterology

## 2018-12-02 VITALS — BP 114/77 | HR 66 | Temp 98.6°F | Resp 18 | Ht 63.0 in | Wt 135.0 lb

## 2018-12-02 DIAGNOSIS — J45909 Unspecified asthma, uncomplicated: Secondary | ICD-10-CM | POA: Diagnosis not present

## 2018-12-02 DIAGNOSIS — K7030 Alcoholic cirrhosis of liver without ascites: Secondary | ICD-10-CM

## 2018-12-02 DIAGNOSIS — K3189 Other diseases of stomach and duodenum: Secondary | ICD-10-CM | POA: Diagnosis not present

## 2018-12-02 DIAGNOSIS — I10 Essential (primary) hypertension: Secondary | ICD-10-CM | POA: Diagnosis not present

## 2018-12-02 DIAGNOSIS — K2970 Gastritis, unspecified, without bleeding: Secondary | ICD-10-CM

## 2018-12-02 MED ORDER — SODIUM CHLORIDE 0.9 % IV SOLN: 500.0000 mL | Freq: Once | INTRAVENOUS | Status: DC

## 2018-12-02 NOTE — Op Note (Signed)
[facility] Patient Name: [name] [name] Procedure Date: 12/02/2018 7:47 AM MRN: [id number] Endoscopist: Milus Banister , MD Age: 63 Referring MD:  Date of Birth: 04-22-1956 Gender: Female Account #: [id number] Procedure:                Upper GI endoscopy Indications:              Screening procedure, cirrhosis. Last EGD 2017 no                            signs of portal gastropathy Medicines:                Monitored Anesthesia Care Procedure:                Pre-Anesthesia Assessment:                           - Prior to the procedure, a History and Physical                            was performed, and patient medications and                            allergies were reviewed. The patient's tolerance of                            previous anesthesia was also reviewed. The risks                            and benefits of the procedure and the sedation                            options and risks were discussed with the patient.                            All questions were answered, and informed consent                            was obtained. Prior Anticoagulants: The patient has                            taken no previous anticoagulant or antiplatelet                            agents. ASA Grade Assessment: III - A patient with                            severe systemic disease. After reviewing the risks                            and benefits, the patient was deemed in                            satisfactory condition to undergo the procedure.  After obtaining informed consent, the endoscope was                            passed under direct vision. Throughout the                            procedure, the patient's blood pressure, pulse, and                            oxygen saturations were monitored continuously. The                            Endoscope was introduced through the mouth, and                            advanced to the second  part of duodenum. The upper                            GI endoscopy was accomplished without difficulty.                            The patient tolerated the procedure well. Scope In: Scope Out: Findings:                 Diffuse moderate inflammation characterized by                            erosions, erythema and friability was found in the                            entire examined stomach. Biopsies were taken with a                            cold forceps for histology.                           The exam was otherwise without abnormality. No                            signs of portal hypertension. Complications:            No immediate complications. Estimated blood loss:                            None. Estimated Blood Loss:     none Impression:               - Moderate pan-gastritis. Biopsies taken to check                            for H. pylori.                           - The examination was otherwise normal. Recommendation:           - Patient has a contact number available for  emergencies. The signs and symptoms of potential                            delayed complications were discussed with the                            patient. Return to normal activities tomorrow.                            Written discharge instructions were provided to the                            patient.                           - Resume previous diet.                           - Continue present medications.                           - Await pathology results. Milus Banister, MD 12/02/2018 8:01:09 AM This report has been signed electronically.

## 2018-12-02 NOTE — Progress Notes (Signed)
[facility] CMA temps and [facility] CMS vitals

## 2018-12-02 NOTE — Patient Instructions (Signed)
YOU HAD AN ENDOSCOPIC PROCEDURE TODAY AT THE [facility]:   Refer to the procedure report that was given to you for any specific questions about what was found during the examination.  If the procedure report does not answer your questions, please call your gastroenterologist to clarify.  If you requested that your care partner not be given the details of your procedure findings, then the procedure report has been included in a sealed envelope for you to review at your convenience later.  YOU SHOULD EXPECT: Some feelings of bloating in the abdomen. Passage of more gas than usual.  Walking can help get rid of the air that was put into your GI tract during the procedure and reduce the bloating. If you had a lower endoscopy (such as a colonoscopy or flexible sigmoidoscopy) you may notice spotting of blood in your stool or on the toilet paper. If you underwent a bowel prep for your procedure, you may not have a normal bowel movement for a few days.  Please Note:  You might notice some irritation and congestion in your nose or some drainage.  This is from the oxygen used during your procedure.  There is no need for concern and it should clear up in a day or so.  SYMPTOMS TO REPORT IMMEDIATELY:   Following upper endoscopy (EGD)  Vomiting of blood or coffee ground material  New chest pain or pain under the shoulder blades  Painful or persistently difficult swallowing  New shortness of breath  Fever of 100F or higher  Black, tarry-looking stools  For urgent or emergent issues, a gastroenterologist can be reached at any hour by calling [phone].   DIET:  We do recommend a small meal at first, but then you may proceed to your regular diet.  Drink plenty of fluids but you should avoid alcoholic beverages for 24 hours.  ACTIVITY:  You should plan to take it easy for the rest of today and you should NOT DRIVE or use heavy machinery until tomorrow (because of the sedation medicines used  during the test).    FOLLOW UP: Our staff will call the number listed on your records 48-72 hours following your procedure to check on you and address any questions or concerns that you may have regarding the information given to you following your procedure. If we do not reach you, we will leave a message.  We will attempt to reach you two times.  During this call, we will ask if you have developed any symptoms of COVID 19. If you develop any symptoms (ie: fever, flu-like symptoms, shortness of breath, cough etc.) before then, please call [phone].  If you test positive for Covid 19 in the 2 weeks post procedure, please call and report this information to us.    If any biopsies were taken you will be contacted by phone or by letter within the next 1-3 weeks.  Please call us at [phone] if you have not heard about the biopsies in 3 weeks.    SIGNATURES/CONFIDENTIALITY: You and/or your care partner have signed paperwork which will be entered into your electronic medical record.  These signatures attest to the fact that that the information above on your After Visit Summary has been reviewed and is understood.  Full responsibility of the confidentiality of this discharge information lies with you and/or your care-partner. 

## 2018-12-02 NOTE — Progress Notes (Signed)
Report to PACU, RN, vss, BBS= Clear.  

## 2018-12-06 ENCOUNTER — Telehealth: Payer: Self-pay

## 2018-12-06 DIAGNOSIS — H5203 Hypermetropia, bilateral: Secondary | ICD-10-CM | POA: Diagnosis not present

## 2018-12-06 NOTE — Telephone Encounter (Signed)
  Follow up Call-  Call back number 12/02/2018 07/13/2017  Post procedure Call Back phone  # [phone], [phone] work [phone]  Permission to leave phone message Yes Yes  Some recent data might be hidden     Patient questions:  Do you have a fever, pain , or abdominal swelling? No. Pain Score  0 *  Have you tolerated food without any problems? Yes.    Have you been able to return to your normal activities? Yes.    Do you have any questions about your discharge instructions: Diet   No. Medications  No. Follow up visit  No.  Do you have questions or concerns about your Care? No.  Actions: * If pain score is 4 or above: No action needed, pain <4. 1. Have you developed a fever since your procedure? no  2.   Have you had an respiratory symptoms (SOB or cough) since your procedure? no  3.   Have you tested positive for COVID 19 since your procedure no  4.   Have you had any family members/close contacts diagnosed with the COVID 19 since your procedure?  no   If yes to any of these questions please route to Joylene John, RN and Alphonsa Gin, Therapist, sports.

## 2018-12-12 MED FILL — SPIRONOLACTONE 50 MG TABLET: 50 | 30 days supply | Qty: 30 | Fill #0

## 2018-12-19 MED FILL — AMLODIPINE BESYLATE 5 MG TA: 5 | 90 days supply | Qty: 90 | Fill #2

## 2018-12-26 MED FILL — FUROSEMIDE 40 MG TAB: 40 | 90 days supply | Qty: 90 | Fill #5

## 2018-12-30 DIAGNOSIS — Z8551 Personal history of malignant neoplasm of bladder: Secondary | ICD-10-CM | POA: Diagnosis not present

## 2019-01-05 ENCOUNTER — Ambulatory Visit: Payer: 59

## 2019-01-13 MED FILL — FLUTICASONE PROP 50 MCG SPR: 50 | 30 days supply | Qty: 16 | Fill #1

## 2019-01-31 DIAGNOSIS — H9041 Sensorineural hearing loss, unilateral, right ear, with unrestricted hearing on the contralateral side: Secondary | ICD-10-CM | POA: Diagnosis not present

## 2019-01-31 DIAGNOSIS — H8101 Meniere's disease, right ear: Secondary | ICD-10-CM | POA: Diagnosis not present

## 2019-02-10 ENCOUNTER — Other Ambulatory Visit: Payer: Self-pay | Admitting: Gastroenterology

## 2019-02-10 ENCOUNTER — Telehealth: Payer: Self-pay | Admitting: Gastroenterology

## 2019-02-10 MED ORDER — SPIRONOLACTONE 50 MG PO TABS: 50.0000 mg | ORAL_TABLET | Freq: Every day | ORAL | 3 refills | Status: DC

## 2019-02-10 MED FILL — SPIRONOLACTONE 50 MG TABLET: 50 | 90 days supply | Qty: 90 | Fill #0

## 2019-02-10 NOTE — Telephone Encounter (Signed)
Sent spironolactone to pharmacy. They ill contact her when ready

## 2019-02-15 MED FILL — DICLOFENAC SODIUM 75 MG TAB: 75 | 84 days supply | Qty: 36 | Fill #0

## 2019-02-15 MED FILL — FLUTICASONE PROP 50 MCG SPR: 50 | 30 days supply | Qty: 16 | Fill #2

## 2019-02-28 ENCOUNTER — Ambulatory Visit
Admission: RE | Admit: 2019-02-28 | Discharge: 2019-02-28 | Disposition: A | Discharge status: Home / Self Care | Payer: 59 | Source: Ambulatory Visit | Attending: Internal Medicine | Admitting: Internal Medicine

## 2019-02-28 ENCOUNTER — Other Ambulatory Visit: Payer: Self-pay

## 2019-02-28 DIAGNOSIS — Z1231 Encounter for screening mammogram for malignant neoplasm of breast: Secondary | ICD-10-CM

## 2019-03-06 ENCOUNTER — Other Ambulatory Visit: Payer: Self-pay | Admitting: Gastroenterology

## 2019-03-06 DIAGNOSIS — K7030 Alcoholic cirrhosis of liver without ascites: Secondary | ICD-10-CM

## 2019-03-16 ENCOUNTER — Other Ambulatory Visit: Payer: Self-pay | Admitting: Internal Medicine

## 2019-03-16 DIAGNOSIS — Z23 Encounter for immunization: Secondary | ICD-10-CM | POA: Diagnosis not present

## 2019-03-16 DIAGNOSIS — M8580 Other specified disorders of bone density and structure, unspecified site: Secondary | ICD-10-CM

## 2019-03-16 DIAGNOSIS — Z0000 Encounter for general adult medical examination without abnormal findings: Secondary | ICD-10-CM | POA: Diagnosis not present

## 2019-03-16 DIAGNOSIS — M8588 Other specified disorders of bone density and structure, other site: Secondary | ICD-10-CM | POA: Diagnosis not present

## 2019-03-16 DIAGNOSIS — K7030 Alcoholic cirrhosis of liver without ascites: Secondary | ICD-10-CM | POA: Diagnosis not present

## 2019-03-16 DIAGNOSIS — I10 Essential (primary) hypertension: Secondary | ICD-10-CM | POA: Diagnosis not present

## 2019-03-20 ENCOUNTER — Telehealth: Payer: Self-pay | Admitting: Gastroenterology

## 2019-03-20 NOTE — Telephone Encounter (Signed)
Pt called stating that we need to call her PCP to get a copy of her recent labs, she tried to request it but they require a signed release form, she was informed that we could call [facility] to have them faxed over so she does not have to sign a form.

## 2019-03-20 NOTE — Telephone Encounter (Signed)
Dr Jac Canavan will have Claiborne Billings look for results and put on your desk.

## 2019-03-20 NOTE — Telephone Encounter (Signed)
Pt stated that she had labs done at [facility] and will have results faxed over to [phone].  FYI.

## 2019-03-20 NOTE — Telephone Encounter (Signed)
Spoke to medical records at [facility]. They will fax over to our office patients most recent labs then will place on Dr Ardis Hughs desk for review.

## 2019-03-21 ENCOUNTER — Ambulatory Visit (HOSPITAL_COMMUNITY)
Admission: RE | Admit: 2019-03-21 | Discharge: 2019-03-21 | Disposition: A | Discharge status: Home / Self Care | Payer: 59 | Source: Ambulatory Visit | Attending: Gastroenterology | Admitting: Gastroenterology

## 2019-03-21 ENCOUNTER — Other Ambulatory Visit: Payer: Self-pay

## 2019-03-21 DIAGNOSIS — K7030 Alcoholic cirrhosis of liver without ascites: Secondary | ICD-10-CM | POA: Diagnosis not present

## 2019-03-21 DIAGNOSIS — K7689 Other specified diseases of liver: Secondary | ICD-10-CM | POA: Diagnosis not present

## 2019-03-21 MED FILL — AMLODIPINE BESYLATE 5 MG TA: 5 | 90 days supply | Qty: 90 | Fill #3

## 2019-03-21 MED FILL — FLUTICASONE PROP 50 MCG SPR: 50 | 30 days supply | Qty: 16 | Fill #3

## 2019-03-21 NOTE — Telephone Encounter (Signed)
The patient has been notified of this information and all questions answered. She will come in for AFP this week

## 2019-03-21 NOTE — Telephone Encounter (Signed)
Please let her know that I reviewed her labs from earlier this month.  Complete metabolic profile was normal.

## 2019-03-22 ENCOUNTER — Other Ambulatory Visit: Payer: 59

## 2019-03-22 DIAGNOSIS — K7030 Alcoholic cirrhosis of liver without ascites: Secondary | ICD-10-CM | POA: Diagnosis not present

## 2019-03-23 LAB — AFP TUMOR MARKER: AFP-Tumor Marker: 4.2 ng/mL

## 2019-03-24 ENCOUNTER — Telehealth: Payer: Self-pay | Admitting: Gastroenterology

## 2019-03-24 NOTE — Telephone Encounter (Signed)
See results note 9/18

## 2019-03-28 ENCOUNTER — Other Ambulatory Visit: Payer: Self-pay | Admitting: Gastroenterology

## 2019-03-28 ENCOUNTER — Telehealth: Payer: Self-pay | Admitting: Gastroenterology

## 2019-03-28 MED ORDER — FUROSEMIDE 40 MG PO TABS: 40.0000 mg | ORAL_TABLET | Freq: Every day | ORAL | 99 refills | Status: DC

## 2019-03-28 MED FILL — FUROSEMIDE 40 MG TAB: 40 | 90 days supply | Qty: 90 | Fill #0

## 2019-03-28 NOTE — Telephone Encounter (Signed)
Pt requested a refill for furosemide.

## 2019-04-14 MED FILL — ALBUTEROL SULFATE HFA 108 (: 108 (90 BAS | 17 days supply | Qty: 7 | Fill #0

## 2019-04-26 MED FILL — FLUTICASONE PROP 50 MCG SPR: 50 | 30 days supply | Qty: 16 | Fill #4

## 2019-05-11 MED FILL — SPIRONOLACTONE 50 MG TABLET: 50 | 90 days supply | Qty: 90 | Fill #1

## 2019-05-17 DIAGNOSIS — H9041 Sensorineural hearing loss, unilateral, right ear, with unrestricted hearing on the contralateral side: Secondary | ICD-10-CM | POA: Diagnosis not present

## 2019-05-17 DIAGNOSIS — H8101 Meniere's disease, right ear: Secondary | ICD-10-CM | POA: Diagnosis not present

## 2019-05-24 ENCOUNTER — Other Ambulatory Visit: Payer: 59

## 2019-05-24 DIAGNOSIS — H8101 Meniere's disease, right ear: Secondary | ICD-10-CM | POA: Diagnosis not present

## 2019-05-24 DIAGNOSIS — H9041 Sensorineural hearing loss, unilateral, right ear, with unrestricted hearing on the contralateral side: Secondary | ICD-10-CM | POA: Diagnosis not present

## 2019-05-24 MED FILL — predniSONE 10 MG TABS: 10 | 6 days supply | Qty: 21 | Fill #0

## 2019-06-05 MED FILL — FLUTICASONE PROP 50 MCG SPR: 50 | 30 days supply | Qty: 16 | Fill #5

## 2019-06-07 DIAGNOSIS — H8101 Meniere's disease, right ear: Secondary | ICD-10-CM | POA: Diagnosis not present

## 2019-06-07 DIAGNOSIS — H9041 Sensorineural hearing loss, unilateral, right ear, with unrestricted hearing on the contralateral side: Secondary | ICD-10-CM | POA: Diagnosis not present

## 2019-06-22 MED FILL — AMLODIPINE BESYLATE 5 MG TA: 5 | 90 days supply | Qty: 90 | Fill #0

## 2019-06-28 MED FILL — FUROSEMIDE 40 MG TAB: 40 | 90 days supply | Qty: 90 | Fill #1

## 2019-07-17 MED FILL — FLUTICASONE PROP 50 MCG SPR: 50 | 30 days supply | Qty: 16 | Fill #0

## 2019-07-17 MED FILL — ALBUTEROL SULFATE HFA 108 (: 108 (90 BAS | 17 days supply | Qty: 7 | Fill #1

## 2019-08-09 MED FILL — SPIRONOLACTONE 50 MG TABLET: 50 | 90 days supply | Qty: 90 | Fill #2

## 2019-08-16 DIAGNOSIS — Z01419 Encounter for gynecological examination (general) (routine) without abnormal findings: Secondary | ICD-10-CM | POA: Diagnosis not present

## 2019-08-16 DIAGNOSIS — Z6824 Body mass index (BMI) 24.0-24.9, adult: Secondary | ICD-10-CM | POA: Diagnosis not present

## 2019-08-18 MED FILL — FLUTICASONE PROP 50 MCG SPR: 50 | 30 days supply | Qty: 16 | Fill #1

## 2019-08-23 MED FILL — ALBUTEROL SULFATE HFA 108 (: 108 (90 BAS | 17 days supply | Qty: 7 | Fill #2

## 2019-08-28 ENCOUNTER — Encounter (INDEPENDENT_AMBULATORY_CARE_PROVIDER_SITE_OTHER): Payer: 59 | Admitting: Ophthalmology

## 2019-08-28 DIAGNOSIS — H3553 Other dystrophies primarily involving the sensory retina: Secondary | ICD-10-CM | POA: Diagnosis not present

## 2019-08-28 DIAGNOSIS — H35371 Puckering of macula, right eye: Secondary | ICD-10-CM

## 2019-08-28 DIAGNOSIS — H2513 Age-related nuclear cataract, bilateral: Secondary | ICD-10-CM

## 2019-08-28 DIAGNOSIS — H43813 Vitreous degeneration, bilateral: Secondary | ICD-10-CM | POA: Diagnosis not present

## 2019-08-28 DIAGNOSIS — H353132 Nonexudative age-related macular degeneration, bilateral, intermediate dry stage: Secondary | ICD-10-CM | POA: Diagnosis not present

## 2019-09-06 DIAGNOSIS — H903 Sensorineural hearing loss, bilateral: Secondary | ICD-10-CM | POA: Diagnosis not present

## 2019-09-06 DIAGNOSIS — H8101 Meniere's disease, right ear: Secondary | ICD-10-CM | POA: Diagnosis not present

## 2019-09-13 MED FILL — AMLODIPINE BESYLATE 5 MG TA: 5 | 90 days supply | Qty: 90 | Fill #1

## 2019-09-13 MED FILL — FLUTICASONE PROP 50 MCG SPR: 50 | 30 days supply | Qty: 16 | Fill #2

## 2019-09-18 DIAGNOSIS — L821 Other seborrheic keratosis: Secondary | ICD-10-CM | POA: Diagnosis not present

## 2019-09-18 DIAGNOSIS — D225 Melanocytic nevi of trunk: Secondary | ICD-10-CM | POA: Diagnosis not present

## 2019-09-18 DIAGNOSIS — Z85828 Personal history of other malignant neoplasm of skin: Secondary | ICD-10-CM | POA: Diagnosis not present

## 2019-09-20 MED FILL — FUROSEMIDE 40 MG TAB: 40 | 90 days supply | Qty: 90 | Fill #2

## 2019-09-21 ENCOUNTER — Telehealth: Payer: Self-pay

## 2019-09-21 DIAGNOSIS — K7030 Alcoholic cirrhosis of liver without ascites: Secondary | ICD-10-CM

## 2019-09-21 NOTE — Telephone Encounter (Signed)
The pt has been advised of the appts and labs.  The pt has been advised of the information and verbalized understanding.

## 2019-09-21 NOTE — Telephone Encounter (Signed)
ROV scheduled for 10/31/19 at 910 am.  Labs in Epic  [place]  on 09/26/19 at 7 am arrive at 645 am with nothing to eat or drink after midnight.  Left message on machine to call back

## 2019-09-21 NOTE — Telephone Encounter (Signed)
-----   Message from Timothy Lasso, RN sent at 03/24/2019 10:14 AM EDT -----  rov in 6 months with repeat US, AFP, CMET, INR, CBC a week prior to that appt. thanks

## 2019-09-26 ENCOUNTER — Ambulatory Visit (HOSPITAL_COMMUNITY)
Admission: RE | Admit: 2019-09-26 | Discharge: 2019-09-26 | Disposition: A | Discharge status: Home / Self Care | Payer: 59 | Source: Ambulatory Visit | Attending: Gastroenterology | Admitting: Gastroenterology

## 2019-09-26 ENCOUNTER — Other Ambulatory Visit: Payer: Self-pay

## 2019-09-26 DIAGNOSIS — K7030 Alcoholic cirrhosis of liver without ascites: Secondary | ICD-10-CM | POA: Insufficient documentation

## 2019-09-26 DIAGNOSIS — K7460 Unspecified cirrhosis of liver: Secondary | ICD-10-CM | POA: Diagnosis not present

## 2019-10-10 DIAGNOSIS — H43821 Vitreomacular adhesion, right eye: Secondary | ICD-10-CM | POA: Diagnosis not present

## 2019-10-13 ENCOUNTER — Encounter (INDEPENDENT_AMBULATORY_CARE_PROVIDER_SITE_OTHER): Payer: 59 | Admitting: Ophthalmology

## 2019-10-13 DIAGNOSIS — H43813 Vitreous degeneration, bilateral: Secondary | ICD-10-CM

## 2019-10-13 DIAGNOSIS — H43821 Vitreomacular adhesion, right eye: Secondary | ICD-10-CM | POA: Diagnosis not present

## 2019-10-13 DIAGNOSIS — H35371 Puckering of macula, right eye: Secondary | ICD-10-CM

## 2019-10-13 DIAGNOSIS — I10 Essential (primary) hypertension: Secondary | ICD-10-CM | POA: Diagnosis not present

## 2019-10-13 DIAGNOSIS — H35033 Hypertensive retinopathy, bilateral: Secondary | ICD-10-CM

## 2019-10-13 DIAGNOSIS — H2513 Age-related nuclear cataract, bilateral: Secondary | ICD-10-CM

## 2019-10-13 DIAGNOSIS — H3553 Other dystrophies primarily involving the sensory retina: Secondary | ICD-10-CM | POA: Diagnosis not present

## 2019-10-16 MED FILL — FLUTICASONE PROP 50 MCG SPR: 50 | 30 days supply | Qty: 16 | Fill #3

## 2019-10-18 DIAGNOSIS — H43821 Vitreomacular adhesion, right eye: Secondary | ICD-10-CM | POA: Diagnosis not present

## 2019-10-18 DIAGNOSIS — H3554 Dystrophies primarily involving the retinal pigment epithelium: Secondary | ICD-10-CM | POA: Diagnosis not present

## 2019-10-18 DIAGNOSIS — H209 Unspecified iridocyclitis: Secondary | ICD-10-CM | POA: Diagnosis not present

## 2019-10-18 DIAGNOSIS — H2513 Age-related nuclear cataract, bilateral: Secondary | ICD-10-CM | POA: Diagnosis not present

## 2019-10-24 ENCOUNTER — Other Ambulatory Visit (INDEPENDENT_AMBULATORY_CARE_PROVIDER_SITE_OTHER): Payer: 59

## 2019-10-24 DIAGNOSIS — K7030 Alcoholic cirrhosis of liver without ascites: Secondary | ICD-10-CM

## 2019-10-25 LAB — CBC WITH DIFFERENTIAL/PLATELET
Basophils Absolute: 0.1 10*3/uL (ref 0.0–0.1)
Basophils Relative: 0.6 % (ref 0.0–3.0)
Eosinophils Absolute: 0.1 10*3/uL (ref 0.0–0.7)
Eosinophils Relative: 1.2 % (ref 0.0–5.0)
HCT: 44.5 % (ref 36.0–46.0)
Hemoglobin: 15 g/dL (ref 12.0–15.0)
Lymphocytes Relative: 31.8 % (ref 12.0–46.0)
Lymphs Abs: 3.2 10*3/uL (ref 0.7–4.0)
MCHC: 33.8 g/dL (ref 30.0–36.0)
MCV: 97.4 fl (ref 78.0–100.0)
Monocytes Absolute: 0.7 10*3/uL (ref 0.1–1.0)
Monocytes Relative: 7.2 % (ref 3.0–12.0)
Neutro Abs: 5.9 10*3/uL (ref 1.4–7.7)
Neutrophils Relative %: 59.2 % (ref 43.0–77.0)
Platelets: 271 10*3/uL (ref 150.0–400.0)
RBC: 4.57 Mil/uL (ref 3.87–5.11)
RDW: 13.7 % (ref 11.5–15.5)
WBC: 10 10*3/uL (ref 4.0–10.5)

## 2019-10-25 LAB — COMPREHENSIVE METABOLIC PANEL
ALT: 19 U/L (ref 0–35)
AST: 24 U/L (ref 0–37)
Albumin: 4.6 g/dL (ref 3.5–5.2)
Alkaline Phosphatase: 117 U/L (ref 39–117)
BUN: 16 mg/dL (ref 6–23)
CO2: 26 mEq/L (ref 19–32)
Calcium: 9.8 mg/dL (ref 8.4–10.5)
Chloride: 98 mEq/L (ref 96–112)
Creatinine, Ser: 0.94 mg/dL (ref 0.40–1.20)
GFR: 59.78 mL/min — ABNORMAL LOW (ref >60.00)
Glucose, Bld: 100 mg/dL — ABNORMAL HIGH (ref 70–99)
Potassium: 3.9 mEq/L (ref 3.5–5.1)
Sodium: 138 mEq/L (ref 135–145)
Total Bilirubin: 0.9 mg/dL (ref 0.2–1.2)
Total Protein: 7.5 g/dL (ref 6.0–8.3)

## 2019-10-25 LAB — PROTIME-INR
INR: 1.2 ratio — ABNORMAL HIGH (ref 0.8–1.0)
Prothrombin Time: 13.2 s — ABNORMAL HIGH (ref 9.6–13.1)

## 2019-10-25 LAB — AFP TUMOR MARKER: AFP-Tumor Marker: 4.3 ng/mL

## 2019-10-31 ENCOUNTER — Ambulatory Visit: Payer: 59 | Admitting: Gastroenterology

## 2019-10-31 ENCOUNTER — Encounter: Payer: Self-pay | Admitting: Gastroenterology

## 2019-10-31 VITALS — BP 132/82 | HR 68 | Temp 98.2°F | Ht 63.0 in | Wt 135.2 lb

## 2019-10-31 DIAGNOSIS — K7030 Alcoholic cirrhosis of liver without ascites: Secondary | ICD-10-CM | POA: Diagnosis not present

## 2019-10-31 NOTE — Patient Instructions (Addendum)
If you are age 65 or older, your body mass index should be between 23-30. Your Body mass index is 23.95 kg/m. If this is out of the aforementioned range listed, please consider follow up with your Primary Care Provider.  If you are age 81 or younger, your body mass index should be between 19-25. Your Body mass index is 23.95 kg/m. If this is out of the aformentioned range listed, please consider follow up with your Primary Care Provider.   You will have a liver ultrasound with lab work (CBC, CMET, coags, and AFP) in September 2021. We will call you to set this up.  You will follow up with our office in April 2022.  Thank you for entrusting me with your care and choosing [facility].  Dr Ardis Hughs

## 2019-10-31 NOTE — Progress Notes (Signed)
Review of pertinent gastrointestinal problems:  1. Alcoholic cirrhosis, extensive, long history of drinking, presented winter, 2008 with jaundice and ascites. Hospitalized April, 2008 for several days. Her most recent imaging was an ultrasound April, 2007 showing an echogenic nodular liver consistent with cirrhosis, no obvious masses. Ascites. In June 2008, difficult to control electrolytes and ascites. Renal consultation with Dr. Marval Regal. The patient is currently on Lasix 80 mg t.i.d. and very low dose aldactone 25 mg once daily with poor control of ascites. Early July 2008, large volume paracentesis with 9 liters of ascites removed (with albumin infusion). Chronic liver disease workup hepatitis A antibody positive (old exposure). Hepatitis B negative, hepatitis C negative; alpha 1 antitrypsin ceruloplasmin, ANA, AMA, antismooth muscle antibody all normal. 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 Started on nadolol 20 mg daily. Summer, 2009 modifying her diuretic regimen to a more Aldactone based regimen.   Most recent EGD:08/2018; no signs of portal hypertension.  There was mild pan gastritis that was negative for H. pylori.  I recommended over-the-counter strength omeprazole once daily, possibly this was NSAID related changes  Was seen at [facility], Dr. Zollie Scale, for pre-transplant workup. Was bumped off transplant list due to bladder tumor diagnosis in 2012.   Most recent [place] 8 (10/2019 labs)   Most recent AFPnormal,09/2019  Most recent liver imaging3/2021 cirrhosis without discrete mass lesions    HPI: This is a very pleasant 64 year old woman whom I last saw about 1 year ago.  She is doing very well.  She runs 3-4 times per week clued a long run of about 10 miles over the weekend.  She exercises in between those as well.  She has had some issues with her right thigh that is causing some mid vision  distortion.  She was relieved to hear from a [facility] ophthalmologist that 70 to 80% of people with her condition improve without any type of intervention.  She continues to not drink.  She is very healthy.  She stopped taking diclofenac but is on periodic NSAIDs such as Aleve.   ROS: complete GI ROS as described in HPI, all other review negative.  Constitutional:  No unintentional weight loss   Past Medical History:  Diagnosis Date  . Allergy    mild  . Arthritis   . Asthma   . Cancer [facility])    bladder cancer  . Cirrhosis, alcoholic ([place])   . Esophageal varices ([place])   . GERD (gastroesophageal reflux disease)   . Hypertension   . Hyponatremia   . Psoriasis     Past Surgical History:  Procedure Laterality Date  . BLADDER SURGERY     Tumor removed   . BREAST REDUCTION SURGERY     Bilateral   . COLONOSCOPY    . REDUCTION MAMMAPLASTY    . UPPER GASTROINTESTINAL ENDOSCOPY      Current Outpatient Medications  Medication Sig Dispense Refill  . albuterol (PROVENTIL HFA;VENTOLIN HFA) 108 (90 BASE) MCG/ACT inhaler Inhale 2 puffs into the lungs every 6 (six) hours as needed for wheezing or shortness of breath.    Marland Kitchen amLODipine (NORVASC) 5 MG tablet Take 5 mg by mouth daily.    Marland Kitchen b complex vitamins tablet Take 1 tablet by mouth daily.      . fluticasone (FLONASE) 50 MCG/ACT nasal spray     . furosemide (LASIX) 40 MG tablet Take 1 tablet (40 mg total) by mouth daily. 30 tablet PRN  .  LUTEIN-ZEAXANTHIN PO Take by mouth daily.    Marland Kitchen omeprazole (PRILOSEC OTC) 20 MG tablet Take 20 mg by mouth daily.    . Prenatal Multivit-Min-Fe-FA (PRE-NATAL FORMULA) TABS Take 1 tablet by mouth daily.     . Probiotic Product (PROBIOTIC DAILY PO) Take by mouth daily.    Marland Kitchen spironolactone (ALDACTONE) 50 MG tablet Take 1 tablet (50 mg total) by mouth daily. 90 tablet 3   No current facility-administered medications for this visit.    Allergies as of 10/31/2019 - Review Complete 10/31/2019  Allergen  Reaction Noted  . Lisinopril  12/07/2006  . Sulfamethoxazole-trimethoprim  12/07/2006    Family History  Problem Relation Age of Onset  . Diabetes Paternal Grandmother   . Diabetes Paternal Grandfather   . Colon cancer Neg Hx   . Colon polyps Neg Hx   . Esophageal cancer Neg Hx   . Rectal cancer Neg Hx   . Stomach cancer Neg Hx     Social History   Socioeconomic History  . Marital status: Married    Spouse name: Not on file  . Number of children: Not on file  . Years of education: Not on file  . Highest education level: Not on file  Occupational History  . Not on file  Tobacco Use  . Smoking status: Never Smoker  . Smokeless tobacco: Never Used  Substance and Sexual Activity  . Alcohol use: No  . Drug use: No  . Sexual activity: Not on file  Other Topics Concern  . Not on file  Social History Narrative  . Not on file   Social Determinants of Health   Financial Resource Strain:   . Difficulty of Paying Living Expenses:   Food Insecurity:   . Worried About Charity fundraiser in the Last Year:   . Arboriculturist in the Last Year:   Transportation Needs:   . Film/video editor (Medical):   Marland Kitchen Lack of Transportation (Non-Medical):   Physical Activity:   . Days of Exercise per Week:   . Minutes of Exercise per Session:   Stress:   . Feeling of Stress :   Social Connections:   . Frequency of Communication with Friends and Family:   . Frequency of Social Gatherings with Friends and Family:   . Attends Religious Services:   . Active Member of Clubs or Organizations:   . Attends Archivist Meetings:   Marland Kitchen Marital Status:   Intimate Partner Violence:   . Fear of Current or Ex-Partner:   . Emotionally Abused:   Marland Kitchen Physically Abused:   . Sexually Abused:      Physical Exam: BP 132/82   Pulse 68   Temp 98.2 F (36.8 C)   Ht 5\' 3"  (1.6 m)   Wt 135 lb 3.2 oz (61.3 kg)   BMI 23.95 kg/m  Constitutional: generally well-appearing Psychiatric:  alert and oriented x3 Abdomen: soft, nontender, nondistended, no obvious ascites, no peritoneal signs, normal bowel sounds No peripheral edema noted in lower extremities  Assessment and plan: 64 y.o. female with well compensated alcoholic cirrhosis  Last drink 13 or 14 years ago now.  She is such a remarkable person, she runs 3-4 times per week.  She has no signs of decompensated liver disease.  She will return to see me in 1 year and will have repeat blood work including CBC, complete metabolic profile, coags and alpha-fetoprotein as well as ultrasound in 6 months.  Please see the "Patient  Instructions" section for addition details about the plan.  Owens Loffler, MD [facility] 10/31/2019, 9:24 AM   Total time on date of encounter was 20 minutes (this included time spent preparing to see the patient reviewing records; obtaining and/or reviewing separately obtained history; performing a medically appropriate exam and/or evaluation; counseling and educating the patient and family if present; ordering medications, tests or procedures if applicable; and documenting clinical information in the health record).

## 2019-11-06 ENCOUNTER — Encounter (INDEPENDENT_AMBULATORY_CARE_PROVIDER_SITE_OTHER): Payer: 59 | Admitting: Ophthalmology

## 2019-11-06 DIAGNOSIS — H3553 Other dystrophies primarily involving the sensory retina: Secondary | ICD-10-CM | POA: Diagnosis not present

## 2019-11-06 DIAGNOSIS — H43813 Vitreous degeneration, bilateral: Secondary | ICD-10-CM | POA: Diagnosis not present

## 2019-11-06 DIAGNOSIS — H35033 Hypertensive retinopathy, bilateral: Secondary | ICD-10-CM | POA: Diagnosis not present

## 2019-11-06 DIAGNOSIS — H35371 Puckering of macula, right eye: Secondary | ICD-10-CM | POA: Diagnosis not present

## 2019-11-06 DIAGNOSIS — I10 Essential (primary) hypertension: Secondary | ICD-10-CM | POA: Diagnosis not present

## 2019-11-09 MED FILL — SPIRONOLACTONE 50 MG TABLET: 50 | 90 days supply | Qty: 90 | Fill #3

## 2019-11-27 ENCOUNTER — Other Ambulatory Visit: Payer: 59

## 2019-12-07 ENCOUNTER — Ambulatory Visit
Admission: RE | Admit: 2019-12-07 | Discharge: 2019-12-07 | Disposition: A | Discharge status: Home / Self Care | Payer: 59 | Source: Ambulatory Visit | Attending: Internal Medicine | Admitting: Internal Medicine

## 2019-12-07 ENCOUNTER — Other Ambulatory Visit: Payer: Self-pay

## 2019-12-07 DIAGNOSIS — M8589 Other specified disorders of bone density and structure, multiple sites: Secondary | ICD-10-CM | POA: Diagnosis not present

## 2019-12-07 DIAGNOSIS — M8580 Other specified disorders of bone density and structure, unspecified site: Secondary | ICD-10-CM

## 2019-12-07 DIAGNOSIS — Z780 Asymptomatic menopausal state: Secondary | ICD-10-CM | POA: Diagnosis not present

## 2019-12-12 ENCOUNTER — Other Ambulatory Visit (HOSPITAL_COMMUNITY): Payer: Self-pay | Admitting: Internal Medicine

## 2019-12-18 MED FILL — FUROSEMIDE 40 MG TAB: 40 | 90 days supply | Qty: 90 | Fill #3

## 2019-12-27 DIAGNOSIS — H209 Unspecified iridocyclitis: Secondary | ICD-10-CM | POA: Diagnosis not present

## 2019-12-27 DIAGNOSIS — H3554 Dystrophies primarily involving the retinal pigment epithelium: Secondary | ICD-10-CM | POA: Diagnosis not present

## 2019-12-27 DIAGNOSIS — H43821 Vitreomacular adhesion, right eye: Secondary | ICD-10-CM | POA: Diagnosis not present

## 2020-01-19 MED FILL — FLUTICASONE PROP 50 MCG SPR: 50 | 30 days supply | Qty: 16 | Fill #0

## 2020-02-23 MED FILL — FLUTICASONE PROP 50 MCG SPR: 50 | 30 days supply | Qty: 16 | Fill #1

## 2020-03-05 ENCOUNTER — Other Ambulatory Visit: Payer: Self-pay | Admitting: Gastroenterology

## 2020-03-05 MED FILL — SPIRONOLACTONE 50 MG TABS: 50 | 90 days supply | Qty: 90 | Fill #0

## 2020-03-07 ENCOUNTER — Telehealth: Payer: Self-pay

## 2020-03-07 DIAGNOSIS — K7030 Alcoholic cirrhosis of liver without ascites: Secondary | ICD-10-CM

## 2020-03-07 NOTE — Telephone Encounter (Signed)
-----   Message from Stevan Born, [place] sent at 10/31/2019  9:48 AM EDT ----- Regarding: Schedule Liver US Patient needs to be scheduled for Liver Ultrasound with CBC, CMET, AFP, and coags in Sept 2022.  She will need an office follow up with Dr Ardis Hughs in April 2022

## 2020-03-07 NOTE — Telephone Encounter (Signed)
You have been scheduled for an abdominal ultrasound at [facility] (1st floor of hospital) on 03-29-20 at 8:00am. Please arrive 15 minutes prior to your appointment for registration. Make certain not to have anything to eat or drink after midnight prior to your appointment. Should you need to reschedule your appointment, please contact radiology at [phone]. This test typically takes about 30 minutes to perform.  Patient aware of appointment date and time for ultrasound.  She was also advised lab work is needed as well.  Patient agreed to plan and verbalized understanding. No further questions.

## 2020-03-19 MED FILL — FUROSEMIDE 40 MG TAB: 40 | 90 days supply | Qty: 90 | Fill #4

## 2020-03-19 MED FILL — AMLODIPINE BESYLATE 5 MG TA: 5 | 90 days supply | Qty: 90 | Fill #3

## 2020-03-22 MED FILL — FLUTICASONE PROP 50 MCG SPR: 50 | 30 days supply | Qty: 16 | Fill #2

## 2020-03-27 ENCOUNTER — Other Ambulatory Visit (INDEPENDENT_AMBULATORY_CARE_PROVIDER_SITE_OTHER): Payer: 59

## 2020-03-27 DIAGNOSIS — K7030 Alcoholic cirrhosis of liver without ascites: Secondary | ICD-10-CM | POA: Diagnosis not present

## 2020-03-27 LAB — CBC
HCT: 44.1 % (ref 36.0–46.0)
Hemoglobin: 15.1 g/dL — ABNORMAL HIGH (ref 12.0–15.0)
MCHC: 34.3 g/dL (ref 30.0–36.0)
MCV: 96 fl (ref 78.0–100.0)
Platelets: 234 10*3/uL (ref 150.0–400.0)
RBC: 4.6 Mil/uL (ref 3.87–5.11)
RDW: 13.5 % (ref 11.5–15.5)
WBC: 9.6 10*3/uL (ref 4.0–10.5)

## 2020-03-27 LAB — PROTIME-INR
INR: 1.2 ratio — ABNORMAL HIGH (ref 0.8–1.0)
Prothrombin Time: 13.7 s — ABNORMAL HIGH (ref 9.6–13.1)

## 2020-03-27 LAB — COMPREHENSIVE METABOLIC PANEL
ALT: 24 U/L (ref 0–35)
AST: 28 U/L (ref 0–37)
Albumin: 4.5 g/dL (ref 3.5–5.2)
Alkaline Phosphatase: 131 U/L — ABNORMAL HIGH (ref 39–117)
BUN: 17 mg/dL (ref 6–23)
CO2: 32 mEq/L (ref 19–32)
Calcium: 9.5 mg/dL (ref 8.4–10.5)
Chloride: 99 mEq/L (ref 96–112)
Creatinine, Ser: 0.89 mg/dL (ref 0.40–1.20)
GFR: 63.59 mL/min (ref >60.00)
Glucose, Bld: 94 mg/dL (ref 70–99)
Potassium: 3.8 mEq/L (ref 3.5–5.1)
Sodium: 138 mEq/L (ref 135–145)
Total Bilirubin: 0.8 mg/dL (ref 0.2–1.2)
Total Protein: 7.6 g/dL (ref 6.0–8.3)

## 2020-03-28 ENCOUNTER — Ambulatory Visit (HOSPITAL_COMMUNITY)
Admission: RE | Admit: 2020-03-28 | Discharge: 2020-03-28 | Disposition: A | Discharge status: Home / Self Care | Payer: 59 | Source: Ambulatory Visit | Attending: Gastroenterology | Admitting: Gastroenterology

## 2020-03-28 ENCOUNTER — Other Ambulatory Visit: Payer: Self-pay

## 2020-03-28 DIAGNOSIS — K7030 Alcoholic cirrhosis of liver without ascites: Secondary | ICD-10-CM | POA: Diagnosis not present

## 2020-03-28 DIAGNOSIS — K7460 Unspecified cirrhosis of liver: Secondary | ICD-10-CM | POA: Diagnosis not present

## 2020-03-28 LAB — AFP TUMOR MARKER: AFP-Tumor Marker: 4.5 ng/mL

## 2020-03-29 ENCOUNTER — Ambulatory Visit (HOSPITAL_COMMUNITY): Payer: 59

## 2020-04-11 ENCOUNTER — Other Ambulatory Visit: Payer: Self-pay | Admitting: Internal Medicine

## 2020-04-11 DIAGNOSIS — Z1231 Encounter for screening mammogram for malignant neoplasm of breast: Secondary | ICD-10-CM

## 2020-04-24 MED FILL — FLUTICASONE PROP 50 MCG SPR: 50 | 30 days supply | Qty: 16 | Fill #3

## 2020-05-01 DIAGNOSIS — H2513 Age-related nuclear cataract, bilateral: Secondary | ICD-10-CM | POA: Diagnosis not present

## 2020-05-01 DIAGNOSIS — H43821 Vitreomacular adhesion, right eye: Secondary | ICD-10-CM | POA: Diagnosis not present

## 2020-05-01 DIAGNOSIS — H3554 Dystrophies primarily involving the retinal pigment epithelium: Secondary | ICD-10-CM | POA: Diagnosis not present

## 2020-05-27 MED FILL — FLUTICASONE PROP 50 MCG SPR: 50 | 30 days supply | Qty: 16 | Fill #4

## 2020-06-05 MED FILL — SPIRONOLACTONE 50 MG TABS: 50 | 90 days supply | Qty: 90 | Fill #1

## 2020-06-11 ENCOUNTER — Other Ambulatory Visit: Payer: Self-pay

## 2020-06-11 ENCOUNTER — Ambulatory Visit
Admission: RE | Admit: 2020-06-11 | Discharge: 2020-06-11 | Disposition: A | Discharge status: Home / Self Care | Payer: 59 | Source: Ambulatory Visit | Attending: Internal Medicine | Admitting: Internal Medicine

## 2020-06-11 DIAGNOSIS — Z1231 Encounter for screening mammogram for malignant neoplasm of breast: Secondary | ICD-10-CM

## 2020-06-12 ENCOUNTER — Other Ambulatory Visit: Payer: Self-pay | Admitting: Gastroenterology

## 2020-06-12 MED FILL — AMLODIPINE BESYLATE 5 MG TA: 5 | 90 days supply | Qty: 90 | Fill #0

## 2020-06-12 MED FILL — FUROSEMIDE 40 MG TAB: 40 | 90 days supply | Qty: 90 | Fill #0

## 2020-06-14 ENCOUNTER — Other Ambulatory Visit: Payer: Self-pay | Admitting: Internal Medicine

## 2020-06-14 DIAGNOSIS — N63 Unspecified lump in unspecified breast: Secondary | ICD-10-CM

## 2020-06-24 ENCOUNTER — Other Ambulatory Visit: Payer: Self-pay | Admitting: Obstetrics and Gynecology

## 2020-06-24 ENCOUNTER — Ambulatory Visit
Admission: RE | Admit: 2020-06-24 | Discharge: 2020-06-24 | Disposition: A | Discharge status: Home / Self Care | Payer: 59 | Source: Ambulatory Visit | Attending: Internal Medicine | Admitting: Internal Medicine

## 2020-06-24 ENCOUNTER — Other Ambulatory Visit: Payer: Self-pay

## 2020-06-24 ENCOUNTER — Ambulatory Visit: Payer: 59

## 2020-06-24 DIAGNOSIS — N63 Unspecified lump in unspecified breast: Secondary | ICD-10-CM

## 2020-06-24 DIAGNOSIS — R922 Inconclusive mammogram: Secondary | ICD-10-CM | POA: Diagnosis not present

## 2020-07-02 MED FILL — FLUTICASONE PROP 50 MCG SPR: 50 | 30 days supply | Qty: 16 | Fill #5

## 2020-07-24 DIAGNOSIS — K7030 Alcoholic cirrhosis of liver without ascites: Secondary | ICD-10-CM | POA: Diagnosis not present

## 2020-07-24 DIAGNOSIS — J4520 Mild intermittent asthma, uncomplicated: Secondary | ICD-10-CM | POA: Diagnosis not present

## 2020-07-24 DIAGNOSIS — Z23 Encounter for immunization: Secondary | ICD-10-CM | POA: Diagnosis not present

## 2020-07-24 DIAGNOSIS — M8588 Other specified disorders of bone density and structure, other site: Secondary | ICD-10-CM | POA: Diagnosis not present

## 2020-07-24 DIAGNOSIS — Z8551 Personal history of malignant neoplasm of bladder: Secondary | ICD-10-CM | POA: Diagnosis not present

## 2020-07-24 DIAGNOSIS — I10 Essential (primary) hypertension: Secondary | ICD-10-CM | POA: Diagnosis not present

## 2020-07-24 DIAGNOSIS — Z0000 Encounter for general adult medical examination without abnormal findings: Secondary | ICD-10-CM | POA: Diagnosis not present

## 2020-07-29 DIAGNOSIS — H5203 Hypermetropia, bilateral: Secondary | ICD-10-CM | POA: Diagnosis not present

## 2020-07-31 DIAGNOSIS — Z20828 Contact with and (suspected) exposure to other viral communicable diseases: Secondary | ICD-10-CM | POA: Diagnosis not present

## 2020-08-16 MED FILL — ALBUTEROL SULFATE HFA 108 (: 108 (90 BAS | 50 days supply | Qty: 54 | Fill #1

## 2020-08-19 MED FILL — FLUTICASONE PROP 50 MCG SPR: 50 | 30 days supply | Qty: 16 | Fill #0

## 2020-08-27 ENCOUNTER — Encounter (INDEPENDENT_AMBULATORY_CARE_PROVIDER_SITE_OTHER): Payer: 59 | Admitting: Ophthalmology

## 2020-09-02 MED FILL — SPIRONOLACTONE 50 MG TABS: 50 | 90 days supply | Qty: 90 | Fill #2

## 2020-09-09 MED FILL — AMLODIPINE BESYLATE 5 MG TA: 5 | 90 days supply | Qty: 90 | Fill #1

## 2020-09-09 MED FILL — FUROSEMIDE 40 MG TAB: 40 | 90 days supply | Qty: 90 | Fill #1

## 2020-09-16 ENCOUNTER — Other Ambulatory Visit (HOSPITAL_BASED_OUTPATIENT_CLINIC_OR_DEPARTMENT_OTHER): Payer: Self-pay

## 2020-09-16 MED ORDER — FLUTICASONE PROPIONATE 50 MCG/ACT NA SUSP
2.0000 | Freq: Every day | NASAL | 4 refills | Status: DC
  Filled 2020-09-16: qty 16, 30d supply, fill #0
  Filled 2020-10-23: qty 16, 30d supply, fill #1
  Filled 2020-11-21: qty 16, 30d supply, fill #2
  Filled 2020-12-30: qty 16, 30d supply, fill #3
  Filled 2021-02-04: qty 16, 30d supply, fill #4

## 2020-09-26 ENCOUNTER — Telehealth: Payer: Self-pay

## 2020-09-26 DIAGNOSIS — K7030 Alcoholic cirrhosis of liver without ascites: Secondary | ICD-10-CM

## 2020-09-26 NOTE — Telephone Encounter (Signed)
-----   Message from Timothy Lasso, RN sent at 03/29/2020 10:29 AM EDT ----- CBC, complete metabolic profile, coags, alpha-fetoprotein and ultrasound in 6 months and follow-up with me shortly after that

## 2020-09-26 NOTE — Telephone Encounter (Signed)
Order has been entered for [place] and labs to be completed prior to the upcoming appt.  Will send a message to the pt via My Chart.

## 2020-09-30 ENCOUNTER — Other Ambulatory Visit (INDEPENDENT_AMBULATORY_CARE_PROVIDER_SITE_OTHER): Payer: 59

## 2020-09-30 DIAGNOSIS — K7030 Alcoholic cirrhosis of liver without ascites: Secondary | ICD-10-CM | POA: Diagnosis not present

## 2020-09-30 LAB — COMPREHENSIVE METABOLIC PANEL
ALT: 23 U/L (ref 0–35)
AST: 25 U/L (ref 0–37)
Albumin: 4.5 g/dL (ref 3.5–5.2)
Alkaline Phosphatase: 144 U/L — ABNORMAL HIGH (ref 39–117)
BUN: 13 mg/dL (ref 6–23)
CO2: 32 mEq/L (ref 19–32)
Calcium: 9.9 mg/dL (ref 8.4–10.5)
Chloride: 97 mEq/L (ref 96–112)
Creatinine, Ser: 0.78 mg/dL (ref 0.40–1.20)
GFR: 79.47 mL/min (ref >60.00)
Glucose, Bld: 87 mg/dL (ref 70–99)
Potassium: 4.1 mEq/L (ref 3.5–5.1)
Sodium: 137 mEq/L (ref 135–145)
Total Bilirubin: 0.9 mg/dL (ref 0.2–1.2)
Total Protein: 7.6 g/dL (ref 6.0–8.3)

## 2020-09-30 LAB — CBC WITH DIFFERENTIAL/PLATELET
Basophils Absolute: 0.1 10*3/uL (ref 0.0–0.1)
Basophils Relative: 1.1 % (ref 0.0–3.0)
Eosinophils Absolute: 0.2 10*3/uL (ref 0.0–0.7)
Eosinophils Relative: 1.9 % (ref 0.0–5.0)
HCT: 44 % (ref 36.0–46.0)
Hemoglobin: 15.1 g/dL — ABNORMAL HIGH (ref 12.0–15.0)
Lymphocytes Relative: 29.2 % (ref 12.0–46.0)
Lymphs Abs: 2.3 10*3/uL (ref 0.7–4.0)
MCHC: 34.4 g/dL (ref 30.0–36.0)
MCV: 95.5 fl (ref 78.0–100.0)
Monocytes Absolute: 0.7 10*3/uL (ref 0.1–1.0)
Monocytes Relative: 8.3 % (ref 3.0–12.0)
Neutro Abs: 4.8 10*3/uL (ref 1.4–7.7)
Neutrophils Relative %: 59.5 % (ref 43.0–77.0)
Platelets: 260 10*3/uL (ref 150.0–400.0)
RBC: 4.6 Mil/uL (ref 3.87–5.11)
RDW: 14.1 % (ref 11.5–15.5)
WBC: 8 10*3/uL (ref 4.0–10.5)

## 2020-09-30 LAB — PROTIME-INR
INR: 1.2 ratio — ABNORMAL HIGH (ref 0.8–1.0)
Prothrombin Time: 13.1 s (ref 9.6–13.1)

## 2020-10-01 DIAGNOSIS — D225 Melanocytic nevi of trunk: Secondary | ICD-10-CM | POA: Diagnosis not present

## 2020-10-01 DIAGNOSIS — L820 Inflamed seborrheic keratosis: Secondary | ICD-10-CM | POA: Diagnosis not present

## 2020-10-01 DIAGNOSIS — Z85828 Personal history of other malignant neoplasm of skin: Secondary | ICD-10-CM | POA: Diagnosis not present

## 2020-10-01 DIAGNOSIS — L821 Other seborrheic keratosis: Secondary | ICD-10-CM | POA: Diagnosis not present

## 2020-10-01 LAB — AFP TUMOR MARKER: AFP-Tumor Marker: 5.8 ng/mL

## 2020-10-02 DIAGNOSIS — H35371 Puckering of macula, right eye: Secondary | ICD-10-CM | POA: Diagnosis not present

## 2020-10-02 DIAGNOSIS — H43821 Vitreomacular adhesion, right eye: Secondary | ICD-10-CM | POA: Diagnosis not present

## 2020-10-02 DIAGNOSIS — H3554 Dystrophies primarily involving the retinal pigment epithelium: Secondary | ICD-10-CM | POA: Diagnosis not present

## 2020-10-02 DIAGNOSIS — H2513 Age-related nuclear cataract, bilateral: Secondary | ICD-10-CM | POA: Diagnosis not present

## 2020-10-10 ENCOUNTER — Ambulatory Visit (HOSPITAL_BASED_OUTPATIENT_CLINIC_OR_DEPARTMENT_OTHER)
Admission: RE | Admit: 2020-10-10 | Discharge: 2020-10-10 | Disposition: A | Discharge status: Home / Self Care | Payer: 59 | Source: Ambulatory Visit | Attending: Gastroenterology | Admitting: Gastroenterology

## 2020-10-10 ENCOUNTER — Other Ambulatory Visit: Payer: Self-pay

## 2020-10-10 DIAGNOSIS — K7030 Alcoholic cirrhosis of liver without ascites: Secondary | ICD-10-CM | POA: Insufficient documentation

## 2020-10-23 ENCOUNTER — Other Ambulatory Visit (HOSPITAL_BASED_OUTPATIENT_CLINIC_OR_DEPARTMENT_OTHER): Payer: Self-pay

## 2020-10-28 ENCOUNTER — Encounter: Payer: Self-pay | Admitting: Gastroenterology

## 2020-10-28 ENCOUNTER — Other Ambulatory Visit: Payer: Self-pay

## 2020-10-28 ENCOUNTER — Ambulatory Visit: Payer: 59 | Admitting: Gastroenterology

## 2020-10-28 VITALS — BP 118/82 | HR 72 | Ht 62.5 in | Wt 139.1 lb

## 2020-10-28 DIAGNOSIS — K7030 Alcoholic cirrhosis of liver without ascites: Secondary | ICD-10-CM | POA: Diagnosis not present

## 2020-10-28 NOTE — Progress Notes (Signed)
Review of pertinent gastrointestinal problems:  1. Alcoholic cirrhosis, extensive, long history of drinking, presented winter, 2008 with jaundice and ascites. Hospitalized April, 2008 for several days. Her most recent imaging was an ultrasound April, 2007 showing an echogenic nodular liver consistent with cirrhosis, no obvious masses. Ascites. In June 2008, difficult to control electrolytes and ascites. Renal consultation with Dr. Marval Regal. The patient is currently on Lasix 80 mg t.i.d. and very low dose aldactone 25 mg once daily with poor control of ascites. Early July 2008, large volume paracentesis with 9 liters of ascites removed (with albumin infusion). Chronic liver disease workup hepatitis A antibody positive (old exposure). Hepatitis B negative, hepatitis C negative; alpha 1 antitrypsin ceruloplasmin, ANA, AMA, antismooth muscle antibody all normal. 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 Started on nadolol 20 mg daily. Summer, 2009 modifying her diuretic regimen to a more Aldactone based regimen.   Most recent EGD:08/2018; no signs of portal hypertension.  There was mild pan gastritis that was negative for H. pylori.  I recommended over-the-counter strength omeprazole once daily, possibly this was NSAID related changes  Was seen at [facility], Dr. Zollie Scale, for pre-transplant workup. Was bumped off transplant list due to bladder tumor diagnosis in 2012.   Most recent [place] (10/2020 labs)  Most recent AFPnormal,10/2020  Most recent liver imaging4/2022 cirrhosis without discrete mass lesions   HPI: This is a very pleasant 65 year old woman whom I last saw almost exactly 1 year ago.  She is as usual doing fantastic.  She has continued to remain completely abstinent since 2008 when I first met her when she presented with severe alcoholic hepatitis and decompensated cirrhosis.  She is a fitness buff, runs  and lifts weights.  She ran a half marathon 2 months ago and is planning a 10 mile run this weekend and is hoping to run the [place] again later this year.  She has had no signs of decompensated liver disease.   ROS: complete GI ROS as described in HPI, all other review negative.  Constitutional:  No unintentional weight loss   Past Medical History:  Diagnosis Date  . Allergy    mild  . Arthritis   . Asthma   . Cancer [facility])    bladder cancer  . Cirrhosis, alcoholic ([place])   . Esophageal varices ([place])   . GERD (gastroesophageal reflux disease)   . Hypertension   . Hyponatremia   . Psoriasis     Past Surgical History:  Procedure Laterality Date  . BLADDER SURGERY     Tumor removed   . BREAST REDUCTION SURGERY     Bilateral   . COLONOSCOPY    . REDUCTION MAMMAPLASTY    . UPPER GASTROINTESTINAL ENDOSCOPY      Current Outpatient Medications  Medication Sig Dispense Refill  . albuterol (VENTOLIN HFA) 108 (90 Base) MCG/ACT inhaler INHALE 2 PUFFS EVERY 4 HOURS AS NEEDED 54 g 1  . amLODipine (NORVASC) 5 MG tablet TAKE 1 TABLET BY MOUTH ONCE A DAY 90 tablet 3  . b complex vitamins tablet Take 1 tablet by mouth daily.    . fluticasone (FLONASE) 50 MCG/ACT nasal spray Place 2 sprays into both nostrils daily. 16 g 4  . furosemide (LASIX) 40 MG tablet TAKE 1 TABLET (40 MG TOTAL) BY MOUTH DAILY. 90 tablet 1  . LUTEIN-ZEAXANTHIN PO Take by mouth daily.    Marland Kitchen omeprazole (PRILOSEC OTC) 20 MG tablet Take 20 mg by  mouth daily.    . Prenatal Multivit-Min-Fe-FA (PRE-NATAL FORMULA) TABS Take 1 tablet by mouth daily.    . Probiotic Product (PROBIOTIC DAILY PO) Take by mouth daily.    Marland Kitchen spironolactone (ALDACTONE) 50 MG tablet TAKE 1 TABLET (50 MG TOTAL) BY MOUTH DAILY. 90 tablet 2   No current facility-administered medications for this visit.    Allergies as of 10/28/2020 - Review Complete 10/28/2020  Allergen Reaction Noted  . Lisinopril  12/07/2006  .  Sulfamethoxazole-trimethoprim  12/07/2006    Family History  Problem Relation Age of Onset  . Diabetes Paternal Grandmother   . Diabetes Paternal Grandfather   . Colon cancer Neg Hx   . Colon polyps Neg Hx   . Esophageal cancer Neg Hx   . Rectal cancer Neg Hx   . Stomach cancer Neg Hx     Social History   Socioeconomic History  . Marital status: Married    Spouse name: Not on file  . Number of children: Not on file  . Years of education: Not on file  . Highest education level: Not on file  Occupational History  . Not on file  Tobacco Use  . Smoking status: Never Smoker  . Smokeless tobacco: Never Used  Vaping Use  . Vaping Use: Never used  Substance and Sexual Activity  . Alcohol use: No  . Drug use: No  . Sexual activity: Not on file  Other Topics Concern  . Not on file  Social History Narrative  . Not on file   Social Determinants of Health   Financial Resource Strain: Not on file  Food Insecurity: Not on file  Transportation Needs: Not on file  Physical Activity: Not on file  Stress: Not on file  Social Connections: Not on file  Intimate Partner Violence: Not on file     Physical Exam: BP 118/82 (BP Location: Left Arm, Patient Position: Sitting, Cuff Size: Normal)   Pulse 72   Ht 5' 2.5" (1.588 m) Comment: height measured without shoes  Wt 139 lb 2 oz (63.1 kg)   BMI 25.04 kg/m  Constitutional: generally well-appearing Psychiatric: alert and oriented x3 Abdomen: soft, nontender, nondistended, no obvious ascites, no peritoneal signs, normal bowel sounds No peripheral edema noted in lower extremities  Assessment and plan: 65 y.o. female with well compensated cirrhosis from previous alcohol abuse  She again remains one of my STAR PATIENTS.  She is one of the very few patients I have never met who when diagnosed with severe alcoholic hepatitis, jaundice actually stopped drinking.  She has really been living in a different life for the past 14 years.   She is truly inspiration.  Her liver seems to be doing very well.  No signs of decompensation.  Her meld score is 8 which it has been for quite some time.  She is up-to-date on hepatoma screening  She will get a repeat labs and hepatoma screening in 6 months and will return to see me in 1 year, sooner if any problems.  Please see the "Patient Instructions" section for addition details about the plan.  Owens Loffler, MD [facility] 10/28/2020, 3:27 PM   Total time on date of encounter was 30 minutes (this included time spent preparing to see the patient reviewing records; obtaining and/or reviewing separately obtained history; performing a medically appropriate exam and/or evaluation; counseling and educating the patient and family if present; ordering medications, tests or procedures if applicable; and documenting clinical information in the health record).

## 2020-10-28 NOTE — Patient Instructions (Signed)
If you are age 66 or older, your body mass index should be between 23-30. Your Body mass index is 25.04 kg/m. If this is out of the aforementioned range listed, please consider follow up with your Primary Care Provider.  You will need an abdominal ultrasound ATTN: liver and lab work in 6 months (October 2022).  We will contact you to schedule these appointments.   You will follow up in our office in 1 year (April 2023).  We will contact you to schedule these appointments.  Thank you for entrusting me with your care and choosing [facility].  Dr Ardis Hughs

## 2020-11-21 ENCOUNTER — Other Ambulatory Visit (HOSPITAL_BASED_OUTPATIENT_CLINIC_OR_DEPARTMENT_OTHER): Payer: Self-pay

## 2020-12-03 ENCOUNTER — Other Ambulatory Visit (HOSPITAL_BASED_OUTPATIENT_CLINIC_OR_DEPARTMENT_OTHER): Payer: Self-pay

## 2020-12-03 ENCOUNTER — Other Ambulatory Visit: Payer: Self-pay | Admitting: Gastroenterology

## 2020-12-03 MED ORDER — SPIRONOLACTONE 50 MG PO TABS
50.0000 mg | ORAL_TABLET | Freq: Every day | ORAL | 3 refills | Status: DC
  Filled 2020-12-03: qty 90, 90d supply, fill #0
  Filled 2021-03-03: qty 90, 90d supply, fill #1
  Filled 2021-05-30: qty 90, 90d supply, fill #2
  Filled 2021-08-26: qty 90, 90d supply, fill #3

## 2020-12-16 ENCOUNTER — Other Ambulatory Visit: Payer: Self-pay | Admitting: Gastroenterology

## 2020-12-16 ENCOUNTER — Other Ambulatory Visit (HOSPITAL_BASED_OUTPATIENT_CLINIC_OR_DEPARTMENT_OTHER): Payer: Self-pay

## 2020-12-16 MED ORDER — FUROSEMIDE 40 MG PO TABS
40.0000 mg | ORAL_TABLET | Freq: Every day | ORAL | 2 refills | Status: DC
  Filled 2020-12-16: qty 90, 90d supply, fill #0
  Filled 2021-03-17: qty 90, 90d supply, fill #1
  Filled 2021-06-10: qty 90, 90d supply, fill #2

## 2020-12-16 MED FILL — Amlodipine Besylate Tab 5 MG (Base Equivalent): ORAL | 90 days supply | Qty: 90 | Fill #0 | Status: AC

## 2020-12-30 ENCOUNTER — Other Ambulatory Visit (HOSPITAL_BASED_OUTPATIENT_CLINIC_OR_DEPARTMENT_OTHER): Payer: Self-pay

## 2021-02-04 ENCOUNTER — Other Ambulatory Visit (HOSPITAL_BASED_OUTPATIENT_CLINIC_OR_DEPARTMENT_OTHER): Payer: Self-pay

## 2021-03-03 ENCOUNTER — Other Ambulatory Visit (HOSPITAL_BASED_OUTPATIENT_CLINIC_OR_DEPARTMENT_OTHER): Payer: Self-pay

## 2021-03-07 ENCOUNTER — Other Ambulatory Visit (HOSPITAL_BASED_OUTPATIENT_CLINIC_OR_DEPARTMENT_OTHER): Payer: Self-pay | Admitting: Otolaryngology

## 2021-03-11 ENCOUNTER — Other Ambulatory Visit (HOSPITAL_BASED_OUTPATIENT_CLINIC_OR_DEPARTMENT_OTHER): Payer: Self-pay

## 2021-03-11 MED ORDER — COVID-19 AT HOME ANTIGEN TEST VI KIT
PACK | 0 refills | Status: DC
  Filled 2021-03-11: qty 2, 4d supply, fill #0

## 2021-03-13 ENCOUNTER — Other Ambulatory Visit (HOSPITAL_BASED_OUTPATIENT_CLINIC_OR_DEPARTMENT_OTHER): Payer: Self-pay

## 2021-03-13 MED ORDER — FLUTICASONE PROPIONATE 50 MCG/ACT NA SUSP
2.0000 | Freq: Every day | NASAL | 4 refills | Status: DC
  Filled 2021-03-13: qty 16, 30d supply, fill #0
  Filled 2021-04-17: qty 16, 30d supply, fill #1
  Filled 2021-05-15: qty 16, 30d supply, fill #2
  Filled 2021-06-10: qty 16, 30d supply, fill #3
  Filled 2021-07-21: qty 16, 30d supply, fill #4

## 2021-03-17 ENCOUNTER — Other Ambulatory Visit: Payer: Self-pay

## 2021-03-17 ENCOUNTER — Other Ambulatory Visit (HOSPITAL_BASED_OUTPATIENT_CLINIC_OR_DEPARTMENT_OTHER): Payer: Self-pay

## 2021-03-17 DIAGNOSIS — K7030 Alcoholic cirrhosis of liver without ascites: Secondary | ICD-10-CM

## 2021-03-17 MED FILL — Amlodipine Besylate Tab 5 MG (Base Equivalent): ORAL | 90 days supply | Qty: 90 | Fill #1 | Status: AC

## 2021-03-28 ENCOUNTER — Other Ambulatory Visit (INDEPENDENT_AMBULATORY_CARE_PROVIDER_SITE_OTHER): Payer: 59

## 2021-03-28 DIAGNOSIS — K7030 Alcoholic cirrhosis of liver without ascites: Secondary | ICD-10-CM

## 2021-03-28 LAB — PROTIME-INR
INR: 1.2 ratio — ABNORMAL HIGH (ref 0.8–1.0)
Prothrombin Time: 13.4 s — ABNORMAL HIGH (ref 9.6–13.1)

## 2021-03-28 LAB — COMPREHENSIVE METABOLIC PANEL
ALT: 27 U/L (ref 0–35)
AST: 27 U/L (ref 0–37)
Albumin: 4.5 g/dL (ref 3.5–5.2)
Alkaline Phosphatase: 129 U/L — ABNORMAL HIGH (ref 39–117)
BUN: 16 mg/dL (ref 6–23)
CO2: 30 mEq/L (ref 19–32)
Calcium: 9.9 mg/dL (ref 8.4–10.5)
Chloride: 98 mEq/L (ref 96–112)
Creatinine, Ser: 0.75 mg/dL (ref 0.40–1.20)
GFR: 83.01 mL/min (ref >60.00)
Glucose, Bld: 100 mg/dL — ABNORMAL HIGH (ref 70–99)
Potassium: 3.7 mEq/L (ref 3.5–5.1)
Sodium: 138 mEq/L (ref 135–145)
Total Bilirubin: 1.1 mg/dL (ref 0.2–1.2)
Total Protein: 8 g/dL (ref 6.0–8.3)

## 2021-03-28 LAB — CBC
HCT: 44.2 % (ref 36.0–46.0)
Hemoglobin: 15.1 g/dL — ABNORMAL HIGH (ref 12.0–15.0)
MCHC: 34.1 g/dL (ref 30.0–36.0)
MCV: 95.1 fl (ref 78.0–100.0)
Platelets: 276 10*3/uL (ref 150.0–400.0)
RBC: 4.65 Mil/uL (ref 3.87–5.11)
RDW: 13.5 % (ref 11.5–15.5)
WBC: 10 10*3/uL (ref 4.0–10.5)

## 2021-03-31 LAB — AFP TUMOR MARKER: AFP-Tumor Marker: 4.8 ng/mL

## 2021-04-01 DIAGNOSIS — Z8551 Personal history of malignant neoplasm of bladder: Secondary | ICD-10-CM | POA: Diagnosis not present

## 2021-04-03 ENCOUNTER — Other Ambulatory Visit (HOSPITAL_BASED_OUTPATIENT_CLINIC_OR_DEPARTMENT_OTHER): Payer: Self-pay

## 2021-04-03 ENCOUNTER — Ambulatory Visit: Payer: 59 | Attending: Internal Medicine

## 2021-04-03 DIAGNOSIS — Z23 Encounter for immunization: Secondary | ICD-10-CM

## 2021-04-03 MED ORDER — PFIZER COVID-19 VAC BIVALENT 30 MCG/0.3ML IM SUSP
INTRAMUSCULAR | 0 refills | Status: DC
  Filled 2021-04-03: qty 0.3, 1d supply, fill #0

## 2021-04-03 NOTE — Progress Notes (Signed)
   Covid-19 Vaccination Clinic  Name:  [name] [name]    MRN: [id number] DOB: 04-07-1956  04/03/2021  [name] was observed post Covid-19 immunization for 15 minutes without incident. She was provided with Vaccine Information Sheet and instruction to access the V-Safe system.   [name] was instructed to call 911 with any severe reactions post vaccine: Difficulty breathing  Swelling of face and throat  A fast heartbeat  A bad rash all over body  Dizziness and weakness

## 2021-04-08 ENCOUNTER — Other Ambulatory Visit (HOSPITAL_BASED_OUTPATIENT_CLINIC_OR_DEPARTMENT_OTHER): Payer: Self-pay

## 2021-04-08 DIAGNOSIS — H8101 Meniere's disease, right ear: Secondary | ICD-10-CM | POA: Diagnosis not present

## 2021-04-08 DIAGNOSIS — H903 Sensorineural hearing loss, bilateral: Secondary | ICD-10-CM | POA: Diagnosis not present

## 2021-04-08 MED ORDER — PREDNISONE 10 MG PO TABS
ORAL_TABLET | ORAL | 0 refills | Status: DC
  Filled 2021-04-08: qty 21, 6d supply, fill #0

## 2021-04-15 ENCOUNTER — Other Ambulatory Visit (HOSPITAL_BASED_OUTPATIENT_CLINIC_OR_DEPARTMENT_OTHER): Payer: Self-pay

## 2021-04-15 DIAGNOSIS — H9042 Sensorineural hearing loss, unilateral, left ear, with unrestricted hearing on the contralateral side: Secondary | ICD-10-CM | POA: Diagnosis not present

## 2021-04-15 DIAGNOSIS — H8101 Meniere's disease, right ear: Secondary | ICD-10-CM | POA: Diagnosis not present

## 2021-04-15 MED ORDER — PREDNISONE 10 MG PO TABS
ORAL_TABLET | ORAL | 0 refills | Status: DC
  Filled 2021-04-15: qty 21, 6d supply, fill #0

## 2021-04-17 ENCOUNTER — Ambulatory Visit (HOSPITAL_COMMUNITY): Payer: 59

## 2021-04-17 ENCOUNTER — Other Ambulatory Visit: Payer: Self-pay

## 2021-04-17 ENCOUNTER — Ambulatory Visit (HOSPITAL_BASED_OUTPATIENT_CLINIC_OR_DEPARTMENT_OTHER)
Admission: RE | Admit: 2021-04-17 | Discharge: 2021-04-17 | Disposition: A | Discharge status: Home / Self Care | Payer: 59 | Source: Ambulatory Visit | Attending: Gastroenterology | Admitting: Gastroenterology

## 2021-04-17 ENCOUNTER — Other Ambulatory Visit (HOSPITAL_BASED_OUTPATIENT_CLINIC_OR_DEPARTMENT_OTHER): Payer: Self-pay

## 2021-04-17 DIAGNOSIS — K7030 Alcoholic cirrhosis of liver without ascites: Secondary | ICD-10-CM | POA: Diagnosis not present

## 2021-04-22 DIAGNOSIS — H9042 Sensorineural hearing loss, unilateral, left ear, with unrestricted hearing on the contralateral side: Secondary | ICD-10-CM | POA: Diagnosis not present

## 2021-04-22 DIAGNOSIS — H9122 Sudden idiopathic hearing loss, left ear: Secondary | ICD-10-CM | POA: Diagnosis not present

## 2021-04-23 ENCOUNTER — Other Ambulatory Visit: Payer: Self-pay

## 2021-04-23 DIAGNOSIS — K7030 Alcoholic cirrhosis of liver without ascites: Secondary | ICD-10-CM

## 2021-05-06 ENCOUNTER — Other Ambulatory Visit (HOSPITAL_BASED_OUTPATIENT_CLINIC_OR_DEPARTMENT_OTHER): Payer: Self-pay

## 2021-05-06 DIAGNOSIS — H9122 Sudden idiopathic hearing loss, left ear: Secondary | ICD-10-CM | POA: Diagnosis not present

## 2021-05-06 DIAGNOSIS — H8101 Meniere's disease, right ear: Secondary | ICD-10-CM | POA: Diagnosis not present

## 2021-05-06 DIAGNOSIS — H903 Sensorineural hearing loss, bilateral: Secondary | ICD-10-CM | POA: Diagnosis not present

## 2021-05-06 DIAGNOSIS — H6192 Disorder of left external ear, unspecified: Secondary | ICD-10-CM | POA: Diagnosis not present

## 2021-05-06 MED ORDER — PREDNISONE 20 MG PO TABS
ORAL_TABLET | ORAL | 1 refills | Status: DC
  Filled 2021-05-06: qty 42, 14d supply, fill #0

## 2021-05-15 ENCOUNTER — Other Ambulatory Visit (HOSPITAL_BASED_OUTPATIENT_CLINIC_OR_DEPARTMENT_OTHER): Payer: Self-pay

## 2021-05-15 MED ORDER — ALBUTEROL SULFATE HFA 108 (90 BASE) MCG/ACT IN AERS
INHALATION_SPRAY | RESPIRATORY_TRACT | 5 refills | Status: DC
  Filled 2021-05-15: qty 8.5, 16d supply, fill #0
  Filled 2021-08-26: qty 8.5, 16d supply, fill #1
  Filled 2021-12-11: qty 8.5, 16d supply, fill #2
  Filled 2022-02-16: qty 8.5, 16d supply, fill #3

## 2021-05-20 ENCOUNTER — Other Ambulatory Visit (HOSPITAL_BASED_OUTPATIENT_CLINIC_OR_DEPARTMENT_OTHER): Payer: Self-pay

## 2021-05-20 ENCOUNTER — Other Ambulatory Visit: Payer: Self-pay | Admitting: Internal Medicine

## 2021-05-20 DIAGNOSIS — H903 Sensorineural hearing loss, bilateral: Secondary | ICD-10-CM | POA: Diagnosis not present

## 2021-05-20 DIAGNOSIS — Z1231 Encounter for screening mammogram for malignant neoplasm of breast: Secondary | ICD-10-CM

## 2021-05-20 DIAGNOSIS — H9122 Sudden idiopathic hearing loss, left ear: Secondary | ICD-10-CM | POA: Diagnosis not present

## 2021-05-20 MED ORDER — PREDNISONE 20 MG PO TABS
ORAL_TABLET | ORAL | 1 refills | Status: DC
  Filled 2021-05-20: qty 42, 14d supply, fill #0

## 2021-05-30 ENCOUNTER — Other Ambulatory Visit (HOSPITAL_BASED_OUTPATIENT_CLINIC_OR_DEPARTMENT_OTHER): Payer: Self-pay

## 2021-06-03 DIAGNOSIS — H9191 Unspecified hearing loss, right ear: Secondary | ICD-10-CM | POA: Diagnosis not present

## 2021-06-03 DIAGNOSIS — H9122 Sudden idiopathic hearing loss, left ear: Secondary | ICD-10-CM | POA: Diagnosis not present

## 2021-06-03 DIAGNOSIS — H90A22 Sensorineural hearing loss, unilateral, left ear, with restricted hearing on the contralateral side: Secondary | ICD-10-CM | POA: Diagnosis not present

## 2021-06-05 ENCOUNTER — Other Ambulatory Visit: Payer: Self-pay | Admitting: Otolaryngology

## 2021-06-05 DIAGNOSIS — H9120 Sudden idiopathic hearing loss, unspecified ear: Secondary | ICD-10-CM

## 2021-06-10 ENCOUNTER — Other Ambulatory Visit: Payer: Self-pay

## 2021-06-10 ENCOUNTER — Other Ambulatory Visit (HOSPITAL_BASED_OUTPATIENT_CLINIC_OR_DEPARTMENT_OTHER): Payer: Self-pay

## 2021-06-10 MED ORDER — AMLODIPINE BESYLATE 5 MG PO TABS
5.0000 mg | ORAL_TABLET | Freq: Every day | ORAL | 3 refills | Status: DC
  Filled 2021-06-10: qty 90, 90d supply, fill #0

## 2021-06-11 ENCOUNTER — Encounter (INDEPENDENT_AMBULATORY_CARE_PROVIDER_SITE_OTHER): Payer: 59 | Admitting: Ophthalmology

## 2021-06-13 ENCOUNTER — Other Ambulatory Visit (HOSPITAL_COMMUNITY): Payer: Self-pay

## 2021-06-13 MED ORDER — AMLODIPINE BESYLATE 5 MG PO TABS
5.0000 mg | ORAL_TABLET | Freq: Every day | ORAL | 4 refills | Status: DC
  Filled 2021-06-13 – 2021-09-06 (×2): qty 90, 90d supply, fill #0
  Filled 2021-12-11: qty 90, 90d supply, fill #1
  Filled 2022-02-16 – 2022-02-23 (×2): qty 90, 90d supply, fill #2
  Filled 2022-06-11: qty 90, 90d supply, fill #3

## 2021-06-17 DIAGNOSIS — C44319 Basal cell carcinoma of skin of other parts of face: Secondary | ICD-10-CM | POA: Diagnosis not present

## 2021-06-17 DIAGNOSIS — Z85828 Personal history of other malignant neoplasm of skin: Secondary | ICD-10-CM | POA: Diagnosis not present

## 2021-06-17 DIAGNOSIS — C44212 Basal cell carcinoma of skin of right ear and external auricular canal: Secondary | ICD-10-CM | POA: Diagnosis not present

## 2021-07-03 ENCOUNTER — Other Ambulatory Visit: Payer: Self-pay

## 2021-07-03 ENCOUNTER — Ambulatory Visit
Admission: RE | Admit: 2021-07-03 | Discharge: 2021-07-03 | Disposition: A | Discharge status: Home / Self Care | Payer: 59 | Source: Ambulatory Visit

## 2021-07-03 DIAGNOSIS — Z1231 Encounter for screening mammogram for malignant neoplasm of breast: Secondary | ICD-10-CM

## 2021-07-04 ENCOUNTER — Other Ambulatory Visit (HOSPITAL_BASED_OUTPATIENT_CLINIC_OR_DEPARTMENT_OTHER): Payer: Self-pay

## 2021-07-06 ENCOUNTER — Other Ambulatory Visit: Payer: 59

## 2021-07-08 ENCOUNTER — Encounter (INDEPENDENT_AMBULATORY_CARE_PROVIDER_SITE_OTHER): Payer: 59 | Admitting: Ophthalmology

## 2021-07-08 ENCOUNTER — Other Ambulatory Visit: Payer: Self-pay

## 2021-07-08 DIAGNOSIS — H3553 Other dystrophies primarily involving the sensory retina: Secondary | ICD-10-CM | POA: Diagnosis not present

## 2021-07-08 DIAGNOSIS — I10 Essential (primary) hypertension: Secondary | ICD-10-CM | POA: Diagnosis not present

## 2021-07-08 DIAGNOSIS — H43813 Vitreous degeneration, bilateral: Secondary | ICD-10-CM

## 2021-07-08 DIAGNOSIS — H35033 Hypertensive retinopathy, bilateral: Secondary | ICD-10-CM

## 2021-07-13 ENCOUNTER — Other Ambulatory Visit: Payer: Self-pay

## 2021-07-13 ENCOUNTER — Ambulatory Visit
Admission: RE | Admit: 2021-07-13 | Discharge: 2021-07-13 | Disposition: A | Discharge status: Home / Self Care | Payer: 59 | Source: Ambulatory Visit | Attending: Otolaryngology | Admitting: Otolaryngology

## 2021-07-13 DIAGNOSIS — H9120 Sudden idiopathic hearing loss, unspecified ear: Secondary | ICD-10-CM

## 2021-07-13 MED ORDER — GADOBENATE DIMEGLUMINE 529 MG/ML IV SOLN
13.0000 mL | Freq: Once | INTRAVENOUS | Status: AC | PRN
  Administered 2021-07-13: 13 mL via INTRAVENOUS

## 2021-07-21 ENCOUNTER — Other Ambulatory Visit (HOSPITAL_BASED_OUTPATIENT_CLINIC_OR_DEPARTMENT_OTHER): Payer: Self-pay

## 2021-07-31 ENCOUNTER — Other Ambulatory Visit (HOSPITAL_BASED_OUTPATIENT_CLINIC_OR_DEPARTMENT_OTHER): Payer: Self-pay

## 2021-07-31 DIAGNOSIS — Z85828 Personal history of other malignant neoplasm of skin: Secondary | ICD-10-CM | POA: Diagnosis not present

## 2021-07-31 DIAGNOSIS — C44212 Basal cell carcinoma of skin of right ear and external auricular canal: Secondary | ICD-10-CM | POA: Diagnosis not present

## 2021-07-31 MED ORDER — MUPIROCIN 2 % EX OINT
TOPICAL_OINTMENT | CUTANEOUS | 0 refills | Status: DC
  Filled 2021-07-31: qty 22, 7d supply, fill #0

## 2021-08-14 ENCOUNTER — Other Ambulatory Visit (HOSPITAL_BASED_OUTPATIENT_CLINIC_OR_DEPARTMENT_OTHER): Payer: Self-pay

## 2021-08-26 ENCOUNTER — Other Ambulatory Visit (HOSPITAL_BASED_OUTPATIENT_CLINIC_OR_DEPARTMENT_OTHER): Payer: Self-pay

## 2021-08-29 ENCOUNTER — Other Ambulatory Visit (HOSPITAL_BASED_OUTPATIENT_CLINIC_OR_DEPARTMENT_OTHER): Payer: Self-pay

## 2021-09-02 ENCOUNTER — Other Ambulatory Visit (HOSPITAL_BASED_OUTPATIENT_CLINIC_OR_DEPARTMENT_OTHER): Payer: Self-pay

## 2021-09-06 ENCOUNTER — Other Ambulatory Visit: Payer: Self-pay | Admitting: Gastroenterology

## 2021-09-08 ENCOUNTER — Other Ambulatory Visit (HOSPITAL_BASED_OUTPATIENT_CLINIC_OR_DEPARTMENT_OTHER): Payer: Self-pay

## 2021-09-08 ENCOUNTER — Other Ambulatory Visit: Payer: Self-pay | Admitting: Gastroenterology

## 2021-09-08 MED ORDER — FUROSEMIDE 40 MG PO TABS
40.0000 mg | ORAL_TABLET | Freq: Every day | ORAL | 1 refills | Status: DC
  Filled 2021-09-08: qty 90, 90d supply, fill #0
  Filled 2021-12-04: qty 90, 90d supply, fill #1

## 2021-09-12 DIAGNOSIS — M8588 Other specified disorders of bone density and structure, other site: Secondary | ICD-10-CM | POA: Diagnosis not present

## 2021-09-12 DIAGNOSIS — Z0000 Encounter for general adult medical examination without abnormal findings: Secondary | ICD-10-CM | POA: Diagnosis not present

## 2021-09-12 DIAGNOSIS — I10 Essential (primary) hypertension: Secondary | ICD-10-CM | POA: Diagnosis not present

## 2021-09-12 DIAGNOSIS — J4520 Mild intermittent asthma, uncomplicated: Secondary | ICD-10-CM | POA: Diagnosis not present

## 2021-09-12 DIAGNOSIS — K7030 Alcoholic cirrhosis of liver without ascites: Secondary | ICD-10-CM | POA: Diagnosis not present

## 2021-09-15 ENCOUNTER — Other Ambulatory Visit (HOSPITAL_BASED_OUTPATIENT_CLINIC_OR_DEPARTMENT_OTHER): Payer: Self-pay

## 2021-10-15 ENCOUNTER — Other Ambulatory Visit (INDEPENDENT_AMBULATORY_CARE_PROVIDER_SITE_OTHER): Payer: 59

## 2021-10-15 ENCOUNTER — Encounter: Payer: Self-pay | Admitting: Gastroenterology

## 2021-10-15 DIAGNOSIS — K7030 Alcoholic cirrhosis of liver without ascites: Secondary | ICD-10-CM

## 2021-10-15 LAB — CBC WITH DIFFERENTIAL/PLATELET
Basophils Absolute: 0.1 10*3/uL (ref 0.0–0.1)
Basophils Relative: 1 % (ref 0.0–3.0)
Eosinophils Absolute: 0.1 10*3/uL (ref 0.0–0.7)
Eosinophils Relative: 0.5 % (ref 0.0–5.0)
HCT: 47.5 % — ABNORMAL HIGH (ref 36.0–46.0)
Hemoglobin: 16 g/dL — ABNORMAL HIGH (ref 12.0–15.0)
Lymphocytes Relative: 21.5 % (ref 12.0–46.0)
Lymphs Abs: 2.5 10*3/uL (ref 0.7–4.0)
MCHC: 33.7 g/dL (ref 30.0–36.0)
MCV: 93.2 fl (ref 78.0–100.0)
Monocytes Absolute: 0.8 10*3/uL (ref 0.1–1.0)
Monocytes Relative: 7 % (ref 3.0–12.0)
Neutro Abs: 8.1 10*3/uL — ABNORMAL HIGH (ref 1.4–7.7)
Neutrophils Relative %: 70 % (ref 43.0–77.0)
Platelets: 265 10*3/uL (ref 150.0–400.0)
RBC: 5.09 Mil/uL (ref 3.87–5.11)
RDW: 13.6 % (ref 11.5–15.5)
WBC: 11.5 10*3/uL — ABNORMAL HIGH (ref 4.0–10.5)

## 2021-10-15 LAB — COMPREHENSIVE METABOLIC PANEL
ALT: 24 U/L (ref 0–35)
AST: 27 U/L (ref 0–37)
Albumin: 4.8 g/dL (ref 3.5–5.2)
Alkaline Phosphatase: 131 U/L — ABNORMAL HIGH (ref 39–117)
BUN: 22 mg/dL (ref 6–23)
CO2: 31 mEq/L (ref 19–32)
Calcium: 9.9 mg/dL (ref 8.4–10.5)
Chloride: 97 mEq/L (ref 96–112)
Creatinine, Ser: 0.83 mg/dL (ref 0.40–1.20)
GFR: 73.22 mL/min (ref >60.00)
Glucose, Bld: 96 mg/dL (ref 70–99)
Potassium: 4.1 mEq/L (ref 3.5–5.1)
Sodium: 137 mEq/L (ref 135–145)
Total Bilirubin: 1 mg/dL (ref 0.2–1.2)
Total Protein: 7.7 g/dL (ref 6.0–8.3)

## 2021-10-15 LAB — PROTIME-INR
INR: 1.2 ratio — ABNORMAL HIGH (ref 0.8–1.0)
Prothrombin Time: 12.7 s (ref 9.6–13.1)

## 2021-10-17 LAB — AFP TUMOR MARKER: AFP-Tumor Marker: 4.6 ng/mL

## 2021-10-22 ENCOUNTER — Other Ambulatory Visit: Payer: Self-pay

## 2021-10-22 ENCOUNTER — Telehealth: Payer: Self-pay

## 2021-10-22 DIAGNOSIS — K7030 Alcoholic cirrhosis of liver without ascites: Secondary | ICD-10-CM

## 2021-10-22 NOTE — Telephone Encounter (Signed)
-----   Message from Timothy Lasso, RN sent at 04/23/2021 10:29 AM EDT ----- ?She needs return office visit with me in 6 months.  CBC, complete metabolic profile, coags, alpha-fetoprotein and right upper quadrant ultrasound of her liver a few days prior to that appointment.  Thank you  ? ?

## 2021-10-22 NOTE — Telephone Encounter (Signed)
I have sent the pt a message on My Chart to have labs and [place].  She has been advised of appt as well for follow up with Dr Ardis Hughs. ?

## 2021-10-27 ENCOUNTER — Ambulatory Visit (HOSPITAL_BASED_OUTPATIENT_CLINIC_OR_DEPARTMENT_OTHER)
Admission: RE | Admit: 2021-10-27 | Discharge: 2021-10-27 | Disposition: A | Discharge status: Home / Self Care | Payer: 59 | Source: Ambulatory Visit | Attending: Gastroenterology | Admitting: Gastroenterology

## 2021-10-27 DIAGNOSIS — Z0389 Encounter for observation for other suspected diseases and conditions ruled out: Secondary | ICD-10-CM | POA: Diagnosis not present

## 2021-10-27 DIAGNOSIS — K7030 Alcoholic cirrhosis of liver without ascites: Secondary | ICD-10-CM | POA: Diagnosis not present

## 2021-10-29 ENCOUNTER — Encounter: Payer: Self-pay | Admitting: Gastroenterology

## 2021-10-29 ENCOUNTER — Ambulatory Visit: Payer: 59 | Admitting: Gastroenterology

## 2021-10-29 VITALS — BP 130/78 | HR 76 | Ht 62.0 in | Wt 140.6 lb

## 2021-10-29 DIAGNOSIS — K7030 Alcoholic cirrhosis of liver without ascites: Secondary | ICD-10-CM | POA: Diagnosis not present

## 2021-10-29 NOTE — Patient Instructions (Signed)
If you are age 67 or older, your body mass index should be between 23-30. Your Body mass index is 25.72 kg/m?Marland Kitchen If this is out of the aforementioned range listed, please consider follow up with your Primary Care Provider. ?________________________________________________________ ? ?The [facility] providers would like to encourage you to use [facility] to communicate with providers for non-urgent requests or questions.  Due to long hold times on the telephone, sending your provider a message by [facility] may be a faster and more efficient way to get a response.  Please allow 48 business hours for a response.  Please remember that this is for non-urgent requests.  ?_______________________________________________________ ? ?You will need a RUQ ultrasound and lab work in 6 months (October 2023).  We will contact you to schedule this appointment. ? ?You will need a follow up appointment in 2 years.  We will contact you to schedule this appointment. ? ?Thank you for entrusting me with your care and choosing [facility]. ? ?Dr Ardis Hughs ? ?

## 2021-10-29 NOTE — Progress Notes (Signed)
Review of pertinent gastrointestinal problems:   ?1. Alcoholic cirrhosis, extensive, long history of drinking, presented winter, 2008 with jaundice and ascites. Hospitalized April, 2008 for several days. Her most recent imaging was an ultrasound April, 2007 showing an echogenic nodular liver consistent with cirrhosis, no obvious masses. Ascites. In June 2008, difficult to control electrolytes and ascites. Renal consultation with Dr. Marval Regal. The patient is currently on Lasix 80 mg t.i.d. and very low dose aldactone 25 mg once daily with poor control of ascites. Early July 2008, large volume paracentesis with 9 liters of ascites removed (with albumin infusion). Chronic liver disease workup hepatitis A antibody positive (old exposure). Hepatitis B negative, hepatitis C negative; alpha 1 antitrypsin ceruloplasmin, ANA, AMA, antismooth muscle antibody all normal. Iron tests essentially normal. 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 Started on nadolol 20 mg daily. Summer, 2009 modifying her diuretic regimen to a more Aldactone based regimen.   ?Most recent EGD: 08/2018; no signs of portal hypertension.  There was mild pan gastritis that was negative for H. pylori.  I recommended over-the-counter strength omeprazole once daily, possibly this was NSAID related changes ?Was seen at [facility], Dr. Zollie Scale, for pre-transplant workup. Was bumped off transplant list due to bladder tumor diagnosis in 2012.   ?Most recent MELD 8 (10/2021 labs)   ?Most recent AFP normal, 10/2021 ?Most recent liver imaging 10/2021 US showed cirrhosis without discrete mass lesions ? ?2. Routine risk for colon cancer.  Colonoscopy 07/2017 Dr. Ardis Hughs found a single subcentimeter polyp that was not precancerous. ? ? ?HPI: ?This is a very pleasant 66 year old woman whom I last saw 1 year ago. ? ?Weight up 1 pound in the past year ? ?She is again doing great.  She was in [place] this past February and is  planning to go to a wedding in [place] in a few months. ? ?She continues to run several times per week, she does strength training at the gym.  She works full-time I believe. ? ?She has had no issues with edema, overt GI bleeding, encephalopathy ? ?ROS: complete GI ROS as described in HPI, all other review negative. ? ?Constitutional:  No unintentional weight loss ? ? ?Past Medical History:  ?Diagnosis Date  ? Allergy   ? mild  ? Arthritis   ? Asthma   ? Cancer [facility])   ? bladder cancer  ? Cirrhosis, alcoholic ([place])   ? Esophageal varices (HCC)   ? GERD (gastroesophageal reflux disease)   ? Hypertension   ? Hyponatremia   ? Psoriasis   ? ? ?Past Surgical History:  ?Procedure Laterality Date  ? BASAL CELL CARCINOMA EXCISION Right   ? Ear  ? BLADDER SURGERY    ? Tumor removed   ? BREAST REDUCTION SURGERY    ? Bilateral   ? COLONOSCOPY    ? REDUCTION MAMMAPLASTY    ? UPPER GASTROINTESTINAL ENDOSCOPY    ? ? ?Current Outpatient Medications  ?Medication Instructions  ? albuterol (VENTOLIN HFA) 108 (90 Base) MCG/ACT inhaler Inhale 2 puffs into the lungs every 4 hours as needed  ? amLODipine (NORVASC) 5 mg, Oral, Daily  ? b complex vitamins tablet 1 tablet, Oral, Daily,    ? COVID-19 At Home Antigen Test KIT use as directed.  ? COVID-19 mRNA bivalent vaccine, Pfizer, (PFIZER COVID-19 VAC BIVALENT) injection Intramuscular  ? fluticasone (FLONASE) 50 MCG/ACT nasal spray 2 sprays, Each Nare, Daily  ? furosemide (LASIX) 40 mg, Oral, Daily  ?  LUTEIN-ZEAXANTHIN PO Oral, Daily  ? omeprazole (PRILOSEC OTC) 20 mg, Oral, Daily  ? Prenatal Multivit-Min-Fe-FA (PRE-NATAL FORMULA) TABS 1 tablet, Oral, Daily,    ? Probiotic Product (PROBIOTIC DAILY PO) Oral, Daily  ? spironolactone (ALDACTONE) 50 mg, Oral, Daily  ? ? ?Allergies as of 10/29/2021 - Review Complete 10/29/2021  ?Allergen Reaction Noted  ? Lisinopril  12/07/2006  ? Sulfamethoxazole-trimethoprim  12/07/2006  ? ? ?Family History  ?Problem Relation Age of Onset  ? Diabetes  Paternal Grandmother   ? Diabetes Paternal Grandfather   ? Colon cancer Neg Hx   ? Colon polyps Neg Hx   ? Esophageal cancer Neg Hx   ? Rectal cancer Neg Hx   ? Stomach cancer Neg Hx   ? ? ?Social History  ? ?Socioeconomic History  ? Marital status: Married  ?  Spouse name: Not on file  ? Number of children: Not on file  ? Years of education: Not on file  ? Highest education level: Not on file  ?Occupational History  ? Not on file  ?Tobacco Use  ? Smoking status: Never  ? Smokeless tobacco: Never  ?Vaping Use  ? Vaping Use: Never used  ?Substance and Sexual Activity  ? Alcohol use: No  ? Drug use: No  ? Sexual activity: Not on file  ?Other Topics Concern  ? Not on file  ?Social History Narrative  ? Not on file  ? ?Social Determinants of Health  ? ?Financial Resource Strain: Not on file  ?Food Insecurity: Not on file  ?Transportation Needs: Not on file  ?Physical Activity: Not on file  ?Stress: Not on file  ?Social Connections: Not on file  ?Intimate Partner Violence: Not on file  ? ? ? ?Physical Exam: ?BP 130/78   Pulse 76   Ht '5\' 2"'  (1.575 m)   Wt 140 lb 9.6 oz (63.8 kg)   BMI 25.72 kg/m?  ?Constitutional: generally well-appearing ?Psychiatric: alert and oriented x3 ?Abdomen: soft, nontender, nondistended, no obvious ascites, no peritoneal signs, normal bowel sounds ?No peripheral edema noted in lower extremities ? ?Assessment and plan: ?65 y.o. female with alcoholic cirrhosis well compensated ? ?She continues to do great.  She runs, she exercises with strength training.  She has no trouble with edema, ascites, encephalopathy, varices.  Her last alcoholic beverage was 15 years ago or so.  She is in [place]. ? ?She will need continued hepatoma screening with ultrasound and blood work in 6 months.  She will return to see me in the office here in 2 years and sooner if needed. ? ?She would like help establishing care with a new primary care physician since hers at [facility] is retiring and we will help with that  certainly ? ?Please see the "Patient Instructions" section for addition details about the plan. ? ?Owens Loffler, MD ?[facility] ?10/29/2021, 8:31 AM ? ? ?Total time on date of encounter was 20 minutes (this included time spent preparing to see the patient reviewing records; obtaining and/or reviewing separately obtained history; performing a medically appropriate exam and/or evaluation; counseling and educating the patient and family if present; ordering medications, tests or procedures if applicable; and documenting clinical information in the health record). ? ?

## 2021-11-27 ENCOUNTER — Other Ambulatory Visit (HOSPITAL_BASED_OUTPATIENT_CLINIC_OR_DEPARTMENT_OTHER): Payer: Self-pay

## 2021-11-27 ENCOUNTER — Other Ambulatory Visit: Payer: Self-pay | Admitting: Gastroenterology

## 2021-11-27 MED ORDER — SPIRONOLACTONE 50 MG PO TABS
50.0000 mg | ORAL_TABLET | Freq: Every day | ORAL | 3 refills | Status: DC
Start: 2021-11-27 — End: 2022-11-24
  Filled 2021-11-27: qty 90, 90d supply, fill #0
  Filled 2022-02-16: qty 80, 80d supply, fill #1
  Filled 2022-02-16: qty 10, 10d supply, fill #1
  Filled 2022-05-26: qty 90, 90d supply, fill #2
  Filled 2022-08-25: qty 90, 90d supply, fill #3

## 2021-11-28 DIAGNOSIS — D225 Melanocytic nevi of trunk: Secondary | ICD-10-CM | POA: Diagnosis not present

## 2021-11-28 DIAGNOSIS — L821 Other seborrheic keratosis: Secondary | ICD-10-CM | POA: Diagnosis not present

## 2021-11-28 DIAGNOSIS — L570 Actinic keratosis: Secondary | ICD-10-CM | POA: Diagnosis not present

## 2021-11-28 DIAGNOSIS — D485 Neoplasm of uncertain behavior of skin: Secondary | ICD-10-CM | POA: Diagnosis not present

## 2021-11-28 DIAGNOSIS — Z85828 Personal history of other malignant neoplasm of skin: Secondary | ICD-10-CM | POA: Diagnosis not present

## 2021-11-28 DIAGNOSIS — D2272 Melanocytic nevi of left lower limb, including hip: Secondary | ICD-10-CM | POA: Diagnosis not present

## 2021-11-28 DIAGNOSIS — L820 Inflamed seborrheic keratosis: Secondary | ICD-10-CM | POA: Diagnosis not present

## 2021-12-04 ENCOUNTER — Other Ambulatory Visit (HOSPITAL_BASED_OUTPATIENT_CLINIC_OR_DEPARTMENT_OTHER): Payer: Self-pay

## 2021-12-11 ENCOUNTER — Other Ambulatory Visit (HOSPITAL_BASED_OUTPATIENT_CLINIC_OR_DEPARTMENT_OTHER): Payer: Self-pay

## 2021-12-23 DIAGNOSIS — H5203 Hypermetropia, bilateral: Secondary | ICD-10-CM | POA: Diagnosis not present

## 2022-02-16 ENCOUNTER — Other Ambulatory Visit: Payer: Self-pay | Admitting: Gastroenterology

## 2022-02-16 ENCOUNTER — Other Ambulatory Visit (HOSPITAL_BASED_OUTPATIENT_CLINIC_OR_DEPARTMENT_OTHER): Payer: Self-pay

## 2022-02-16 MED ORDER — FUROSEMIDE 40 MG PO TABS
40.0000 mg | ORAL_TABLET | Freq: Every day | ORAL | 3 refills | Status: DC
  Filled 2022-02-16 – 2022-02-20 (×2): qty 90, 90d supply, fill #0
  Filled 2022-05-26: qty 90, 90d supply, fill #1
  Filled 2022-08-30: qty 90, 90d supply, fill #2
  Filled 2022-11-24: qty 90, 90d supply, fill #3

## 2022-02-18 NOTE — Progress Notes (Unsigned)
[place] Phone: [phone] Subjective:   Fontaine No, am serving as a scribe for Dr. Hulan Saas.  I'm seeing this patient by the request  of:  Lavone Orn, MD  CC: Achilles and groin pain  LYY:[id number]  [name] [name] is a 66 y.o. female coming in with complaint of achilles and groin pain. Last seen in 2019 for adductor strain. Patient states that her groin pain has flared back up on the R side. Insidious onset. Runs 2x a week, walks 2x a week and does UE strength training 2x a week. Training for 1/2 marathon. Feels a buckling of hip at mile 4. Pain in the joint line. Has tried compression sleeve which does seem to be helpful.   Also having R achilles pain that is progressively getting worse since it started 6 month ago. Pain present in all shoes. Still able to run through pain but is slower than usual. Has not tried any bracing.      Past Medical History:  Diagnosis Date   Allergy    mild   Arthritis    Asthma    Cancer ([place])    bladder cancer   Cirrhosis, alcoholic (HCC)    Esophageal varices (HCC)    GERD (gastroesophageal reflux disease)    Hypertension    Hyponatremia    Psoriasis    Past Surgical History:  Procedure Laterality Date   BASAL CELL CARCINOMA EXCISION Right    Ear   BLADDER SURGERY     Tumor removed    BREAST REDUCTION SURGERY     Bilateral    COLONOSCOPY     REDUCTION MAMMAPLASTY     UPPER GASTROINTESTINAL ENDOSCOPY     Social History   Socioeconomic History   Marital status: Married    Spouse name: Not on file   Number of children: Not on file   Years of education: Not on file   Highest education level: Not on file  Occupational History   Not on file  Tobacco Use   Smoking status: Never   Smokeless tobacco: Never  Vaping Use   Vaping Use: Never used  Substance and Sexual Activity   Alcohol use: No   Drug use: No   Sexual activity: Not on file   Other Topics Concern   Not on file  Social History Narrative   Not on file   Social Determinants of Health   Financial Resource Strain: Not on file  Food Insecurity: Not on file  Transportation Needs: Not on file  Physical Activity: Not on file  Stress: Not on file  Social Connections: Not on file   Allergies  Allergen Reactions   Lisinopril     REACTION: unspecified   Sulfamethoxazole-Trimethoprim     REACTION: unspecified   Family History  Problem Relation Age of Onset   Diabetes Paternal Grandmother    Diabetes Paternal Grandfather    Colon cancer Neg Hx    Colon polyps Neg Hx    Esophageal cancer Neg Hx    Rectal cancer Neg Hx    Stomach cancer Neg Hx      Current Outpatient Medications (Cardiovascular):    amLODipine (NORVASC) 5 MG tablet, Take 1 tablet (5 mg total) by mouth daily.   furosemide (LASIX) 40 MG tablet, Take 1 tablet (40 mg total) by mouth daily.   spironolactone (ALDACTONE) 50 MG tablet, Take 1 tablet (50 mg total) by mouth daily.  Current  Outpatient Medications (Respiratory):    albuterol (VENTOLIN HFA) 108 (90 Base) MCG/ACT inhaler, Inhale 2 puffs into the lungs every 4 hours as needed   fluticasone (FLONASE) 50 MCG/ACT nasal spray, Place 2 sprays into both nostrils daily.    Current Outpatient Medications (Other):    b complex vitamins tablet, Take 1 tablet by mouth daily.   COVID-19 At Home Antigen Test KIT, use as directed.   COVID-19 mRNA bivalent vaccine, Pfizer, (PFIZER COVID-19 VAC BIVALENT) injection, Inject into the muscle.   LUTEIN-ZEAXANTHIN PO*, Take by mouth daily.   omeprazole (PRILOSEC OTC) 20 MG tablet, Take 20 mg by mouth daily.   Prenatal Multivit-Min-Fe-FA (PRE-NATAL FORMULA) TABS, Take 1 tablet by mouth daily.   Probiotic Product (PROBIOTIC DAILY PO), Take by mouth daily.   Vitamin D, Ergocalciferol, (DRISDOL) 1.25 MG (50000 UNIT) CAPS capsule, Take 1 capsule (50,000 Units total) by mouth every 7 (seven) days. * These  medications belong to multiple therapeutic classes and are listed under each applicable group.   Reviewed prior external information including notes and imaging from  primary care provider As well as notes that were available from care everywhere and other healthcare systems.  Past medical history, social, surgical and family history all reviewed in electronic medical record.  No pertanent information unless stated regarding to the chief complaint.   Review of Systems:  No headache, visual changes, nausea, vomiting, diarrhea, constipation, dizziness, abdominal pain, skin rash, fevers, chills, night sweats, weight loss, swollen lymph nodes, body aches, joint swelling, chest pain, shortness of breath, mood changes. POSITIVE muscle aches  Objective  Blood pressure (!) 136/94, pulse 67, height '5\' 2"'  (1.575 m), weight 145 lb (65.8 kg), SpO2 97 %.   General: No apparent distress alert and oriented x3 mood and affect normal, dressed appropriately.  HEENT: Pupils equal, extraocular movements intact  Respiratory: Patient's speak in full sentences and does not appear short of breath  Cardiovascular: No lower extremity edema, non tender, no erythema  Right hip exam shows the patient does have good range of motion noted.  Mild increase in tightness with FABER test compared to the contralateral side.  Patient does have some mild antalgic gait noted 2.  Right ankle exam shows the patient does have a Haglund nodule noted.  Nontender to palpation in this area.  Full strength though noted of the ankle.  Limited muscular skeletal ultrasound was performed and interpreted by Hulan Saas, M  Limited ultrasound of patient's right ankle shows that patient does have some hypoechoic changes at the insertion of the Achilles.  Some mild increase in neovascularization that is consistent with intersubstance tearing no cortical irregularity noted but shows any avulsion noted. Impression: Moderate to severe Achilles  tendinitis   Impression and Recommendations:     The above documentation has been reviewed and is accurate and complete [name] Pulley, DO

## 2022-02-19 ENCOUNTER — Other Ambulatory Visit (HOSPITAL_BASED_OUTPATIENT_CLINIC_OR_DEPARTMENT_OTHER): Payer: Self-pay

## 2022-02-19 ENCOUNTER — Ambulatory Visit: Payer: Self-pay

## 2022-02-19 ENCOUNTER — Ambulatory Visit: Payer: 59 | Admitting: Family Medicine

## 2022-02-19 ENCOUNTER — Ambulatory Visit (INDEPENDENT_AMBULATORY_CARE_PROVIDER_SITE_OTHER): Payer: 59

## 2022-02-19 VITALS — BP 136/94 | HR 67 | Ht 62.0 in | Wt 145.0 lb

## 2022-02-19 DIAGNOSIS — M25551 Pain in right hip: Secondary | ICD-10-CM | POA: Diagnosis not present

## 2022-02-19 DIAGNOSIS — M25572 Pain in left ankle and joints of left foot: Secondary | ICD-10-CM | POA: Diagnosis not present

## 2022-02-19 DIAGNOSIS — M7661 Achilles tendinitis, right leg: Secondary | ICD-10-CM

## 2022-02-19 MED ORDER — VITAMIN D (ERGOCALCIFEROL) 1.25 MG (50000 UNIT) PO CAPS
50000.0000 [IU] | ORAL_CAPSULE | ORAL | 0 refills | Status: DC
  Filled 2022-02-19: qty 12, 84d supply, fill #0

## 2022-02-19 NOTE — Assessment & Plan Note (Signed)
Mostly Achilles tendinitis with Haglund nodule noted.  Discussed heel lift, home exercises, icing regimen.  We will decrease some of the activity that she is doing at this point.  Discussed proper shoes and recovery sandals that I think will be helpful.  Follow-up again in 5 to 6 weeks to likely reassess and change management more appropriately.

## 2022-02-19 NOTE — Assessment & Plan Note (Addendum)
Patient does have a right hip pain, seems to be more in the abductors again.  Likely secondary to compensating though for the Achilles tendinitis.  We discussed with patient at great length about different treatment options.  With patient having history of cirrhosis as well as history of bladder cancer we will get x-rays to further evaluate to make sure there is no avascular necrosis or any other significant difficulties.  Patient will continue with compression.  Able to avoid the long runs but still do short runs so patient can train for the half marathon in November.  Follow-up with me again in 6 weeks

## 2022-02-19 NOTE — Patient Instructions (Addendum)
Xray today OOFOS Recovery Sandals in the house Heel lift 1/8 inch  Exercises for Achilles Thigh compression for hip No long runs for 3 weeks Once weekly Vit D See me again in 5-6 weeks

## 2022-02-20 ENCOUNTER — Other Ambulatory Visit (HOSPITAL_BASED_OUTPATIENT_CLINIC_OR_DEPARTMENT_OTHER): Payer: Self-pay

## 2022-02-23 ENCOUNTER — Other Ambulatory Visit (HOSPITAL_BASED_OUTPATIENT_CLINIC_OR_DEPARTMENT_OTHER): Payer: Self-pay

## 2022-03-16 DIAGNOSIS — M7661 Achilles tendinitis, right leg: Secondary | ICD-10-CM | POA: Diagnosis not present

## 2022-03-16 DIAGNOSIS — M76821 Posterior tibial tendinitis, right leg: Secondary | ICD-10-CM | POA: Diagnosis not present

## 2022-03-16 DIAGNOSIS — M25671 Stiffness of right ankle, not elsewhere classified: Secondary | ICD-10-CM | POA: Diagnosis not present

## 2022-03-16 DIAGNOSIS — R260 Ataxic gait: Secondary | ICD-10-CM | POA: Diagnosis not present

## 2022-03-19 DIAGNOSIS — M25671 Stiffness of right ankle, not elsewhere classified: Secondary | ICD-10-CM | POA: Diagnosis not present

## 2022-03-19 DIAGNOSIS — R260 Ataxic gait: Secondary | ICD-10-CM | POA: Diagnosis not present

## 2022-03-19 DIAGNOSIS — M7661 Achilles tendinitis, right leg: Secondary | ICD-10-CM | POA: Diagnosis not present

## 2022-03-19 DIAGNOSIS — M76821 Posterior tibial tendinitis, right leg: Secondary | ICD-10-CM | POA: Diagnosis not present

## 2022-03-20 DIAGNOSIS — Z8551 Personal history of malignant neoplasm of bladder: Secondary | ICD-10-CM | POA: Diagnosis not present

## 2022-03-23 ENCOUNTER — Telehealth: Payer: Self-pay

## 2022-03-23 DIAGNOSIS — M25671 Stiffness of right ankle, not elsewhere classified: Secondary | ICD-10-CM | POA: Diagnosis not present

## 2022-03-23 DIAGNOSIS — M76821 Posterior tibial tendinitis, right leg: Secondary | ICD-10-CM | POA: Diagnosis not present

## 2022-03-23 DIAGNOSIS — K7030 Alcoholic cirrhosis of liver without ascites: Secondary | ICD-10-CM

## 2022-03-23 DIAGNOSIS — R260 Ataxic gait: Secondary | ICD-10-CM | POA: Diagnosis not present

## 2022-03-23 DIAGNOSIS — M7661 Achilles tendinitis, right leg: Secondary | ICD-10-CM | POA: Diagnosis not present

## 2022-03-23 NOTE — Telephone Encounter (Signed)
-----   Message from Stevan Born, [place] sent at 10/29/2021  8:55 AM EDT ----- Regarding: 6 month labs and [place]  RUQ ultrasound and lab work (CBC. CMET, PT/INR, AFP)in 6 months (October 2023).

## 2022-03-23 NOTE — Telephone Encounter (Signed)
Fine to order in my name  Please have her wait until 04/13/22 or later to do the tests as I will be away on a long trip until then

## 2022-03-23 NOTE — Telephone Encounter (Signed)
Dr Carlean Purl,   This is a patient of Dr Patsy [place] alcoholic cirrhosis and  will need lab work and RUQ ultrasound in October 23. You are DOD this am. Is it OK to order labs and imagining under your name.  Please advise.  Thank you, Elmyra Ricks

## 2022-03-23 NOTE — Telephone Encounter (Signed)
Patient aware that she will need to have RUQ abdominal ultrasound and lab work per Dr Ardis Hughs.  Patient is scheduled for 04-14-22 at [place] [facility].  Patient agreed to plan and verbalized understanding.  No further questions.

## 2022-03-26 DIAGNOSIS — M76821 Posterior tibial tendinitis, right leg: Secondary | ICD-10-CM | POA: Diagnosis not present

## 2022-03-26 DIAGNOSIS — M7661 Achilles tendinitis, right leg: Secondary | ICD-10-CM | POA: Diagnosis not present

## 2022-03-26 DIAGNOSIS — M25671 Stiffness of right ankle, not elsewhere classified: Secondary | ICD-10-CM | POA: Diagnosis not present

## 2022-03-26 DIAGNOSIS — R260 Ataxic gait: Secondary | ICD-10-CM | POA: Diagnosis not present

## 2022-03-30 DIAGNOSIS — M25671 Stiffness of right ankle, not elsewhere classified: Secondary | ICD-10-CM | POA: Diagnosis not present

## 2022-03-30 DIAGNOSIS — R260 Ataxic gait: Secondary | ICD-10-CM | POA: Diagnosis not present

## 2022-03-30 DIAGNOSIS — M7661 Achilles tendinitis, right leg: Secondary | ICD-10-CM | POA: Diagnosis not present

## 2022-03-30 DIAGNOSIS — M76821 Posterior tibial tendinitis, right leg: Secondary | ICD-10-CM | POA: Diagnosis not present

## 2022-04-02 ENCOUNTER — Ambulatory Visit: Payer: 59 | Admitting: Family Medicine

## 2022-04-02 DIAGNOSIS — R260 Ataxic gait: Secondary | ICD-10-CM | POA: Diagnosis not present

## 2022-04-02 DIAGNOSIS — M25671 Stiffness of right ankle, not elsewhere classified: Secondary | ICD-10-CM | POA: Diagnosis not present

## 2022-04-02 DIAGNOSIS — M76821 Posterior tibial tendinitis, right leg: Secondary | ICD-10-CM | POA: Diagnosis not present

## 2022-04-02 DIAGNOSIS — M7661 Achilles tendinitis, right leg: Secondary | ICD-10-CM | POA: Diagnosis not present

## 2022-04-07 DIAGNOSIS — M25671 Stiffness of right ankle, not elsewhere classified: Secondary | ICD-10-CM | POA: Diagnosis not present

## 2022-04-07 DIAGNOSIS — M7661 Achilles tendinitis, right leg: Secondary | ICD-10-CM | POA: Diagnosis not present

## 2022-04-07 DIAGNOSIS — M76821 Posterior tibial tendinitis, right leg: Secondary | ICD-10-CM | POA: Diagnosis not present

## 2022-04-07 DIAGNOSIS — R260 Ataxic gait: Secondary | ICD-10-CM | POA: Diagnosis not present

## 2022-04-09 DIAGNOSIS — R260 Ataxic gait: Secondary | ICD-10-CM | POA: Diagnosis not present

## 2022-04-09 DIAGNOSIS — M7661 Achilles tendinitis, right leg: Secondary | ICD-10-CM | POA: Diagnosis not present

## 2022-04-09 DIAGNOSIS — M76821 Posterior tibial tendinitis, right leg: Secondary | ICD-10-CM | POA: Diagnosis not present

## 2022-04-09 DIAGNOSIS — M25671 Stiffness of right ankle, not elsewhere classified: Secondary | ICD-10-CM | POA: Diagnosis not present

## 2022-04-13 ENCOUNTER — Other Ambulatory Visit (HOSPITAL_BASED_OUTPATIENT_CLINIC_OR_DEPARTMENT_OTHER): Payer: Self-pay

## 2022-04-13 ENCOUNTER — Ambulatory Visit: Payer: 59 | Admitting: Internal Medicine

## 2022-04-13 ENCOUNTER — Encounter: Payer: Self-pay | Admitting: Internal Medicine

## 2022-04-13 DIAGNOSIS — K7030 Alcoholic cirrhosis of liver without ascites: Secondary | ICD-10-CM | POA: Diagnosis not present

## 2022-04-13 DIAGNOSIS — I10 Essential (primary) hypertension: Secondary | ICD-10-CM | POA: Diagnosis not present

## 2022-04-13 DIAGNOSIS — J4520 Mild intermittent asthma, uncomplicated: Secondary | ICD-10-CM | POA: Diagnosis not present

## 2022-04-13 DIAGNOSIS — M76821 Posterior tibial tendinitis, right leg: Secondary | ICD-10-CM | POA: Diagnosis not present

## 2022-04-13 DIAGNOSIS — R260 Ataxic gait: Secondary | ICD-10-CM | POA: Diagnosis not present

## 2022-04-13 DIAGNOSIS — K219 Gastro-esophageal reflux disease without esophagitis: Secondary | ICD-10-CM

## 2022-04-13 DIAGNOSIS — M7661 Achilles tendinitis, right leg: Secondary | ICD-10-CM | POA: Diagnosis not present

## 2022-04-13 DIAGNOSIS — M25671 Stiffness of right ankle, not elsewhere classified: Secondary | ICD-10-CM | POA: Diagnosis not present

## 2022-04-13 MED ORDER — FLUTICASONE PROPIONATE 50 MCG/ACT NA SUSP
2.0000 | Freq: Every day | NASAL | 3 refills | Status: DC
  Filled 2022-04-13: qty 48, 90d supply, fill #0
  Filled 2023-03-24: qty 48, 90d supply, fill #1

## 2022-04-13 NOTE — Patient Instructions (Signed)
We will check the cholesterol  in the spring.   You can come back for a physical sometime next year.

## 2022-04-13 NOTE — Progress Notes (Signed)
   Subjective:   Patient ID: [name] [name], female    DOB: [birth year], 66 y.o.   MRN: [id number]  HPI The patient is a new 66 YO female coming in for ongoing care.  PMH, [facility], social history reviewed and updated  Review of Systems  Constitutional: Negative.   HENT: Negative.    Eyes: Negative.   Respiratory:  Negative for cough, chest tightness and shortness of breath.   Cardiovascular:  Negative for chest pain, palpitations and leg swelling.  Gastrointestinal:  Negative for abdominal distention, abdominal pain, constipation, diarrhea, nausea and vomiting.  Musculoskeletal: Negative.   Skin: Negative.   Neurological: Negative.   Psychiatric/Behavioral: Negative.      Objective:  Physical Exam Constitutional:      Appearance: She is well-developed.  HENT:     Head: Normocephalic and atraumatic.  Cardiovascular:     Rate and Rhythm: Normal rate and regular rhythm.  Pulmonary:     Effort: Pulmonary effort is normal. No respiratory distress.     Breath sounds: Normal breath sounds. No wheezing or rales.  Abdominal:     General: Bowel sounds are normal. There is no distension.     Palpations: Abdomen is soft.     Tenderness: There is no abdominal tenderness. There is no rebound.  Musculoskeletal:     Cervical back: Normal range of motion.  Skin:    General: Skin is warm and dry.  Neurological:     Mental Status: She is alert and oriented to person, place, and time.     Coordination: Coordination normal.     Vitals:   04/13/22 1534  BP: 126/84  Pulse: 73  SpO2: 96%  Weight: 146 lb (66.2 kg)  Height: '5\' 3"'$  (1.6 m)    Assessment & Plan:

## 2022-04-14 ENCOUNTER — Other Ambulatory Visit (INDEPENDENT_AMBULATORY_CARE_PROVIDER_SITE_OTHER): Payer: 59

## 2022-04-14 ENCOUNTER — Ambulatory Visit (HOSPITAL_BASED_OUTPATIENT_CLINIC_OR_DEPARTMENT_OTHER)
Admission: RE | Admit: 2022-04-14 | Discharge: 2022-04-14 | Disposition: A | Discharge status: Home / Self Care | Payer: 59 | Source: Ambulatory Visit | Attending: Internal Medicine | Admitting: Internal Medicine

## 2022-04-14 ENCOUNTER — Ambulatory Visit (HOSPITAL_COMMUNITY): Payer: 59

## 2022-04-14 DIAGNOSIS — K7030 Alcoholic cirrhosis of liver without ascites: Secondary | ICD-10-CM

## 2022-04-14 DIAGNOSIS — K7460 Unspecified cirrhosis of liver: Secondary | ICD-10-CM | POA: Diagnosis not present

## 2022-04-14 LAB — COMPREHENSIVE METABOLIC PANEL
ALT: 20 U/L (ref 0–35)
AST: 23 U/L (ref 0–37)
Albumin: 4.5 g/dL (ref 3.5–5.2)
Alkaline Phosphatase: 127 U/L — ABNORMAL HIGH (ref 39–117)
BUN: 20 mg/dL (ref 6–23)
CO2: 31 mEq/L (ref 19–32)
Calcium: 10 mg/dL (ref 8.4–10.5)
Chloride: 98 mEq/L (ref 96–112)
Creatinine, Ser: 0.8 mg/dL (ref 0.40–1.20)
GFR: 76.26 mL/min (ref >60.00)
Glucose, Bld: 90 mg/dL (ref 70–99)
Potassium: 3.9 mEq/L (ref 3.5–5.1)
Sodium: 138 mEq/L (ref 135–145)
Total Bilirubin: 1.3 mg/dL — ABNORMAL HIGH (ref 0.2–1.2)
Total Protein: 7.7 g/dL (ref 6.0–8.3)

## 2022-04-14 LAB — CBC
HCT: 44.3 % (ref 36.0–46.0)
Hemoglobin: 15.2 g/dL — ABNORMAL HIGH (ref 12.0–15.0)
MCHC: 34.4 g/dL (ref 30.0–36.0)
MCV: 94 fl (ref 78.0–100.0)
Platelets: 289 10*3/uL (ref 150.0–400.0)
RBC: 4.71 Mil/uL (ref 3.87–5.11)
RDW: 13.8 % (ref 11.5–15.5)
WBC: 9.6 10*3/uL (ref 4.0–10.5)

## 2022-04-14 LAB — PROTIME-INR
INR: 1.2 ratio — ABNORMAL HIGH (ref 0.8–1.0)
Prothrombin Time: 13.2 s — ABNORMAL HIGH (ref 9.6–13.1)

## 2022-04-16 DIAGNOSIS — M7661 Achilles tendinitis, right leg: Secondary | ICD-10-CM | POA: Diagnosis not present

## 2022-04-16 DIAGNOSIS — R260 Ataxic gait: Secondary | ICD-10-CM | POA: Diagnosis not present

## 2022-04-16 DIAGNOSIS — M76821 Posterior tibial tendinitis, right leg: Secondary | ICD-10-CM | POA: Diagnosis not present

## 2022-04-16 DIAGNOSIS — M25671 Stiffness of right ankle, not elsewhere classified: Secondary | ICD-10-CM | POA: Diagnosis not present

## 2022-04-16 LAB — AFP TUMOR MARKER: AFP-Tumor Marker: 4.4 ng/mL

## 2022-04-16 NOTE — Assessment & Plan Note (Signed)
Stable for some years. Gets labs and [place] every 6 months. Follows up with GI.

## 2022-04-16 NOTE — Assessment & Plan Note (Signed)
BP at goal on lasix 40 mg daily and amlodipine 5 mg daily and spironolactone 50 mg daily. Labs getting in next week. Adjust as needed.

## 2022-04-16 NOTE — Assessment & Plan Note (Signed)
Mostly exercise induced currently. Uses albuterol prior to running or vigorous exercise. Does not need refill today. Can fill when needed. No flare today.

## 2022-04-16 NOTE — Assessment & Plan Note (Signed)
Takes prilosec 20 mg daily. Well controlled will continue.

## 2022-04-20 DIAGNOSIS — R260 Ataxic gait: Secondary | ICD-10-CM | POA: Diagnosis not present

## 2022-04-20 DIAGNOSIS — M7661 Achilles tendinitis, right leg: Secondary | ICD-10-CM | POA: Diagnosis not present

## 2022-04-20 DIAGNOSIS — M25671 Stiffness of right ankle, not elsewhere classified: Secondary | ICD-10-CM | POA: Diagnosis not present

## 2022-04-20 DIAGNOSIS — M76821 Posterior tibial tendinitis, right leg: Secondary | ICD-10-CM | POA: Diagnosis not present

## 2022-04-23 DIAGNOSIS — M25671 Stiffness of right ankle, not elsewhere classified: Secondary | ICD-10-CM | POA: Diagnosis not present

## 2022-04-23 DIAGNOSIS — M7661 Achilles tendinitis, right leg: Secondary | ICD-10-CM | POA: Diagnosis not present

## 2022-04-23 DIAGNOSIS — R260 Ataxic gait: Secondary | ICD-10-CM | POA: Diagnosis not present

## 2022-04-23 DIAGNOSIS — M76821 Posterior tibial tendinitis, right leg: Secondary | ICD-10-CM | POA: Diagnosis not present

## 2022-04-24 ENCOUNTER — Ambulatory Visit: Payer: 59 | Admitting: Internal Medicine

## 2022-04-27 DIAGNOSIS — R260 Ataxic gait: Secondary | ICD-10-CM | POA: Diagnosis not present

## 2022-04-27 DIAGNOSIS — M7661 Achilles tendinitis, right leg: Secondary | ICD-10-CM | POA: Diagnosis not present

## 2022-04-27 DIAGNOSIS — M76821 Posterior tibial tendinitis, right leg: Secondary | ICD-10-CM | POA: Diagnosis not present

## 2022-04-27 DIAGNOSIS — M25671 Stiffness of right ankle, not elsewhere classified: Secondary | ICD-10-CM | POA: Diagnosis not present

## 2022-04-30 DIAGNOSIS — M25671 Stiffness of right ankle, not elsewhere classified: Secondary | ICD-10-CM | POA: Diagnosis not present

## 2022-04-30 DIAGNOSIS — R260 Ataxic gait: Secondary | ICD-10-CM | POA: Diagnosis not present

## 2022-04-30 DIAGNOSIS — M7661 Achilles tendinitis, right leg: Secondary | ICD-10-CM | POA: Diagnosis not present

## 2022-04-30 DIAGNOSIS — M76821 Posterior tibial tendinitis, right leg: Secondary | ICD-10-CM | POA: Diagnosis not present

## 2022-05-04 DIAGNOSIS — M76821 Posterior tibial tendinitis, right leg: Secondary | ICD-10-CM | POA: Diagnosis not present

## 2022-05-04 DIAGNOSIS — M25671 Stiffness of right ankle, not elsewhere classified: Secondary | ICD-10-CM | POA: Diagnosis not present

## 2022-05-04 DIAGNOSIS — R260 Ataxic gait: Secondary | ICD-10-CM | POA: Diagnosis not present

## 2022-05-04 DIAGNOSIS — M7661 Achilles tendinitis, right leg: Secondary | ICD-10-CM | POA: Diagnosis not present

## 2022-05-07 DIAGNOSIS — R260 Ataxic gait: Secondary | ICD-10-CM | POA: Diagnosis not present

## 2022-05-07 DIAGNOSIS — M7661 Achilles tendinitis, right leg: Secondary | ICD-10-CM | POA: Diagnosis not present

## 2022-05-07 DIAGNOSIS — M76821 Posterior tibial tendinitis, right leg: Secondary | ICD-10-CM | POA: Diagnosis not present

## 2022-05-07 DIAGNOSIS — M25671 Stiffness of right ankle, not elsewhere classified: Secondary | ICD-10-CM | POA: Diagnosis not present

## 2022-05-12 DIAGNOSIS — M76821 Posterior tibial tendinitis, right leg: Secondary | ICD-10-CM | POA: Diagnosis not present

## 2022-05-12 DIAGNOSIS — R260 Ataxic gait: Secondary | ICD-10-CM | POA: Diagnosis not present

## 2022-05-12 DIAGNOSIS — M25671 Stiffness of right ankle, not elsewhere classified: Secondary | ICD-10-CM | POA: Diagnosis not present

## 2022-05-12 DIAGNOSIS — M7661 Achilles tendinitis, right leg: Secondary | ICD-10-CM | POA: Diagnosis not present

## 2022-05-14 DIAGNOSIS — R260 Ataxic gait: Secondary | ICD-10-CM | POA: Diagnosis not present

## 2022-05-14 DIAGNOSIS — M76821 Posterior tibial tendinitis, right leg: Secondary | ICD-10-CM | POA: Diagnosis not present

## 2022-05-14 DIAGNOSIS — M7661 Achilles tendinitis, right leg: Secondary | ICD-10-CM | POA: Diagnosis not present

## 2022-05-14 DIAGNOSIS — M25671 Stiffness of right ankle, not elsewhere classified: Secondary | ICD-10-CM | POA: Diagnosis not present

## 2022-05-18 DIAGNOSIS — M76821 Posterior tibial tendinitis, right leg: Secondary | ICD-10-CM | POA: Diagnosis not present

## 2022-05-18 DIAGNOSIS — M25671 Stiffness of right ankle, not elsewhere classified: Secondary | ICD-10-CM | POA: Diagnosis not present

## 2022-05-18 DIAGNOSIS — R260 Ataxic gait: Secondary | ICD-10-CM | POA: Diagnosis not present

## 2022-05-18 DIAGNOSIS — M7661 Achilles tendinitis, right leg: Secondary | ICD-10-CM | POA: Diagnosis not present

## 2022-05-21 DIAGNOSIS — M76821 Posterior tibial tendinitis, right leg: Secondary | ICD-10-CM | POA: Diagnosis not present

## 2022-05-21 DIAGNOSIS — R260 Ataxic gait: Secondary | ICD-10-CM | POA: Diagnosis not present

## 2022-05-21 DIAGNOSIS — M25671 Stiffness of right ankle, not elsewhere classified: Secondary | ICD-10-CM | POA: Diagnosis not present

## 2022-05-21 DIAGNOSIS — M7661 Achilles tendinitis, right leg: Secondary | ICD-10-CM | POA: Diagnosis not present

## 2022-05-25 DIAGNOSIS — R260 Ataxic gait: Secondary | ICD-10-CM | POA: Diagnosis not present

## 2022-05-25 DIAGNOSIS — M76821 Posterior tibial tendinitis, right leg: Secondary | ICD-10-CM | POA: Diagnosis not present

## 2022-05-25 DIAGNOSIS — M7661 Achilles tendinitis, right leg: Secondary | ICD-10-CM | POA: Diagnosis not present

## 2022-05-25 DIAGNOSIS — M25671 Stiffness of right ankle, not elsewhere classified: Secondary | ICD-10-CM | POA: Diagnosis not present

## 2022-05-26 ENCOUNTER — Other Ambulatory Visit (HOSPITAL_BASED_OUTPATIENT_CLINIC_OR_DEPARTMENT_OTHER): Payer: Self-pay

## 2022-05-26 ENCOUNTER — Encounter (HOSPITAL_BASED_OUTPATIENT_CLINIC_OR_DEPARTMENT_OTHER): Payer: Self-pay | Admitting: Pharmacist

## 2022-05-27 ENCOUNTER — Other Ambulatory Visit (HOSPITAL_BASED_OUTPATIENT_CLINIC_OR_DEPARTMENT_OTHER): Payer: Self-pay

## 2022-05-27 ENCOUNTER — Other Ambulatory Visit: Payer: Self-pay | Admitting: Internal Medicine

## 2022-05-27 MED ORDER — ALBUTEROL SULFATE HFA 108 (90 BASE) MCG/ACT IN AERS
INHALATION_SPRAY | RESPIRATORY_TRACT | 5 refills | Status: DC
  Filled 2022-05-27: qty 6.7, 25d supply, fill #0
  Filled 2022-08-25: qty 6.7, 25d supply, fill #1
  Filled 2023-03-08: qty 6.7, 25d supply, fill #2

## 2022-06-01 DIAGNOSIS — M25671 Stiffness of right ankle, not elsewhere classified: Secondary | ICD-10-CM | POA: Diagnosis not present

## 2022-06-01 DIAGNOSIS — M76821 Posterior tibial tendinitis, right leg: Secondary | ICD-10-CM | POA: Diagnosis not present

## 2022-06-01 DIAGNOSIS — R260 Ataxic gait: Secondary | ICD-10-CM | POA: Diagnosis not present

## 2022-06-01 DIAGNOSIS — M7661 Achilles tendinitis, right leg: Secondary | ICD-10-CM | POA: Diagnosis not present

## 2022-06-02 ENCOUNTER — Encounter: Payer: Self-pay | Admitting: Plastic Surgery

## 2022-06-02 ENCOUNTER — Ambulatory Visit (INDEPENDENT_AMBULATORY_CARE_PROVIDER_SITE_OTHER): Payer: Self-pay | Admitting: Plastic Surgery

## 2022-06-02 VITALS — BP 166/97 | HR 63 | Temp 97.7°F | Resp 16 | Ht 63.0 in | Wt 145.0 lb

## 2022-06-02 DIAGNOSIS — Z719 Counseling, unspecified: Secondary | ICD-10-CM

## 2022-06-02 NOTE — Progress Notes (Signed)
Patient ID: [name] [name], female    DOB: 10-Jul-1955, 66 y.o.   MRN: [id number]   Chief Complaint  Patient presents with   Consult   Breast Problem    The patient is a 66 year old female here for evaluation of her breasts.  She is 5 feet 3 inches tall and weighs 145 pounds.  She had a breast reduction when she was living in [place] approximately 30 years ago.  She was a 38 DDD prior to the surgery.  She is now around a C cup.  Overall she is happy with her size.  Her complaint today is the excess skin and soft tissue in the lateral aspect of the breast and in the axillary area.  I do not see any areas of concern and do not feel any masses.  Did mention to her she has a lot of skin damage from sun.  Past surgical history is listed below and includes skin cancer and bladder surgery.  Her past medical history is listed below as well.  She is not a smoker and her last mammogram was 11 months ago.    Review of Systems  Constitutional: Negative.   HENT: Negative.    Eyes: Negative.   Respiratory: Negative.  Negative for chest tightness and shortness of breath.   Cardiovascular: Negative.   Gastrointestinal: Negative.   Endocrine: Negative.   Genitourinary: Negative.   Musculoskeletal: Negative.   Skin:  Positive for rash and wound.    Past Medical History:  Diagnosis Date   Allergy    mild   Arthritis    Asthma    Cancer ([place])    bladder cancer   Cirrhosis, alcoholic (HCC)    Esophageal varices (HCC)    GERD (gastroesophageal reflux disease)    Hypertension    Hyponatremia    Psoriasis     Past Surgical History:  Procedure Laterality Date   BASAL CELL CARCINOMA EXCISION Right    Ear   BLADDER SURGERY     Tumor removed    BREAST REDUCTION SURGERY     Bilateral    COLONOSCOPY     REDUCTION MAMMAPLASTY     UPPER GASTROINTESTINAL ENDOSCOPY        Current Outpatient Medications:    albuterol (VENTOLIN HFA) 108 (90 Base) MCG/ACT inhaler, Inhale 2 puffs into the  lungs every 4 hours as needed, Disp: 6.7 g, Rfl: 5   amLODipine (NORVASC) 5 MG tablet, Take 1 tablet (5 mg total) by mouth daily., Disp: 90 tablet, Rfl: 4   b complex vitamins tablet, Take 1 tablet by mouth daily., Disp: , Rfl:    fluticasone (FLONASE) 50 MCG/ACT nasal spray, Place 2 sprays into both nostrils daily., Disp: 48 g, Rfl: 3   furosemide (LASIX) 40 MG tablet, Take 1 tablet (40 mg total) by mouth daily., Disp: 90 tablet, Rfl: 3   omeprazole (PRILOSEC OTC) 20 MG tablet, Take 20 mg by mouth daily., Disp: , Rfl:    Prenatal Multivit-Min-Fe-FA (PRE-NATAL FORMULA) TABS, Take 1 tablet by mouth daily., Disp: , Rfl:    Probiotic Product (PROBIOTIC DAILY PO), Take by mouth daily., Disp: , Rfl:    spironolactone (ALDACTONE) 50 MG tablet, Take 1 tablet (50 mg total) by mouth daily., Disp: 90 tablet, Rfl: 3   Vitamin D, Ergocalciferol, (DRISDOL) 1.25 MG (50000 UNIT) CAPS capsule, Take 1 capsule (50,000 Units total) by mouth every 7 (seven) days., Disp: 12 capsule, Rfl: 0   LUTEIN-ZEAXANTHIN PO, Take by mouth  daily., Disp: , Rfl:    Objective:   Vitals:   06/02/22 0836  BP: (!) 166/97  Pulse: 63  Resp: 16  Temp: 97.7 F (36.5 C)  SpO2: 98%    Physical Exam Vitals and nursing note reviewed.  Constitutional:      Appearance: Normal appearance.  HENT:     Head: Normocephalic and atraumatic.  Cardiovascular:     Rate and Rhythm: Normal rate.     Pulses: Normal pulses.  Pulmonary:     Effort: Pulmonary effort is normal.  Abdominal:     General: There is no distension.     Palpations: Abdomen is soft.     Tenderness: There is no abdominal tenderness.  Musculoskeletal:        General: No swelling or deformity.  Skin:    General: Skin is warm.     Capillary Refill: Capillary refill takes less than 2 seconds.     Coloration: Skin is not jaundiced.     Findings: No bruising.  Neurological:     Mental Status: She is alert and oriented to person, place, and time.  Psychiatric:         Mood and Affect: Mood normal.        Behavior: Behavior normal.        Thought Content: Thought content normal.        Judgment: Judgment normal.     Assessment & Plan:  Encounter for counseling  The patient is a candidate for bilateral liposuction of the lateral breast and axillary area.  Her mammogram was 11 months ago so the in December she will need her mammogram for 2023.  We will send her a quote for the surgery.  I would like her to have a preop clearance from her PCP since she is over 60.  Pictures were obtained of the patient and placed in the chart with the patient's or guardian's permission.   [place], DO

## 2022-06-08 DIAGNOSIS — M7661 Achilles tendinitis, right leg: Secondary | ICD-10-CM | POA: Diagnosis not present

## 2022-06-08 DIAGNOSIS — M76821 Posterior tibial tendinitis, right leg: Secondary | ICD-10-CM | POA: Diagnosis not present

## 2022-06-08 DIAGNOSIS — R260 Ataxic gait: Secondary | ICD-10-CM | POA: Diagnosis not present

## 2022-06-08 DIAGNOSIS — M25671 Stiffness of right ankle, not elsewhere classified: Secondary | ICD-10-CM | POA: Diagnosis not present

## 2022-06-11 ENCOUNTER — Other Ambulatory Visit: Payer: Self-pay | Admitting: Internal Medicine

## 2022-06-11 ENCOUNTER — Encounter: Payer: Self-pay | Admitting: Plastic Surgery

## 2022-06-11 DIAGNOSIS — Z1231 Encounter for screening mammogram for malignant neoplasm of breast: Secondary | ICD-10-CM

## 2022-06-22 DIAGNOSIS — M76821 Posterior tibial tendinitis, right leg: Secondary | ICD-10-CM | POA: Diagnosis not present

## 2022-06-22 DIAGNOSIS — R260 Ataxic gait: Secondary | ICD-10-CM | POA: Diagnosis not present

## 2022-06-22 DIAGNOSIS — M25671 Stiffness of right ankle, not elsewhere classified: Secondary | ICD-10-CM | POA: Diagnosis not present

## 2022-06-22 DIAGNOSIS — M7661 Achilles tendinitis, right leg: Secondary | ICD-10-CM | POA: Diagnosis not present

## 2022-07-01 DIAGNOSIS — R260 Ataxic gait: Secondary | ICD-10-CM | POA: Diagnosis not present

## 2022-07-01 DIAGNOSIS — M7661 Achilles tendinitis, right leg: Secondary | ICD-10-CM | POA: Diagnosis not present

## 2022-07-01 DIAGNOSIS — M25671 Stiffness of right ankle, not elsewhere classified: Secondary | ICD-10-CM | POA: Diagnosis not present

## 2022-07-01 DIAGNOSIS — M76821 Posterior tibial tendinitis, right leg: Secondary | ICD-10-CM | POA: Diagnosis not present

## 2022-07-07 ENCOUNTER — Encounter (INDEPENDENT_AMBULATORY_CARE_PROVIDER_SITE_OTHER): Payer: Commercial Managed Care - PPO | Admitting: Ophthalmology

## 2022-07-07 DIAGNOSIS — H3553 Other dystrophies primarily involving the sensory retina: Secondary | ICD-10-CM

## 2022-07-07 DIAGNOSIS — H35033 Hypertensive retinopathy, bilateral: Secondary | ICD-10-CM

## 2022-07-07 DIAGNOSIS — H43813 Vitreous degeneration, bilateral: Secondary | ICD-10-CM | POA: Diagnosis not present

## 2022-07-07 DIAGNOSIS — I10 Essential (primary) hypertension: Secondary | ICD-10-CM | POA: Diagnosis not present

## 2022-07-08 DIAGNOSIS — M7661 Achilles tendinitis, right leg: Secondary | ICD-10-CM | POA: Diagnosis not present

## 2022-07-08 DIAGNOSIS — M25671 Stiffness of right ankle, not elsewhere classified: Secondary | ICD-10-CM | POA: Diagnosis not present

## 2022-07-08 DIAGNOSIS — M76821 Posterior tibial tendinitis, right leg: Secondary | ICD-10-CM | POA: Diagnosis not present

## 2022-07-08 DIAGNOSIS — R260 Ataxic gait: Secondary | ICD-10-CM | POA: Diagnosis not present

## 2022-07-09 ENCOUNTER — Encounter: Payer: Self-pay | Admitting: Internal Medicine

## 2022-07-09 DIAGNOSIS — I10 Essential (primary) hypertension: Secondary | ICD-10-CM

## 2022-07-13 DIAGNOSIS — R260 Ataxic gait: Secondary | ICD-10-CM | POA: Diagnosis not present

## 2022-07-13 DIAGNOSIS — M7661 Achilles tendinitis, right leg: Secondary | ICD-10-CM | POA: Diagnosis not present

## 2022-07-13 DIAGNOSIS — M76821 Posterior tibial tendinitis, right leg: Secondary | ICD-10-CM | POA: Diagnosis not present

## 2022-07-13 DIAGNOSIS — M25671 Stiffness of right ankle, not elsewhere classified: Secondary | ICD-10-CM | POA: Diagnosis not present

## 2022-07-20 DIAGNOSIS — M25671 Stiffness of right ankle, not elsewhere classified: Secondary | ICD-10-CM | POA: Diagnosis not present

## 2022-07-20 DIAGNOSIS — R260 Ataxic gait: Secondary | ICD-10-CM | POA: Diagnosis not present

## 2022-07-20 DIAGNOSIS — M76821 Posterior tibial tendinitis, right leg: Secondary | ICD-10-CM | POA: Diagnosis not present

## 2022-07-20 DIAGNOSIS — M7661 Achilles tendinitis, right leg: Secondary | ICD-10-CM | POA: Diagnosis not present

## 2022-07-21 ENCOUNTER — Other Ambulatory Visit (HOSPITAL_BASED_OUTPATIENT_CLINIC_OR_DEPARTMENT_OTHER): Payer: Self-pay

## 2022-07-21 ENCOUNTER — Ambulatory Visit (INDEPENDENT_AMBULATORY_CARE_PROVIDER_SITE_OTHER): Payer: Commercial Managed Care - PPO | Admitting: Surgical

## 2022-07-21 ENCOUNTER — Encounter: Payer: Self-pay | Admitting: Surgical

## 2022-07-21 ENCOUNTER — Institutional Professional Consult (permissible substitution): Payer: 59 | Admitting: Plastic Surgery

## 2022-07-21 VITALS — BP 164/106 | HR 70 | Ht 63.0 in | Wt 143.0 lb

## 2022-07-21 DIAGNOSIS — Z719 Counseling, unspecified: Secondary | ICD-10-CM

## 2022-07-21 MED ORDER — OXYCODONE HCL 5 MG PO TABS
5.0000 mg | ORAL_TABLET | Freq: Four times a day (QID) | ORAL | 0 refills | Status: AC | PRN
Start: 2022-07-21 — End: 2022-07-27
  Filled 2022-07-21: qty 20, 5d supply, fill #0

## 2022-07-21 MED ORDER — ONDANSETRON HCL 4 MG PO TABS
4.0000 mg | ORAL_TABLET | Freq: Three times a day (TID) | ORAL | 0 refills | Status: DC | PRN
  Filled 2022-07-21: qty 20, 7d supply, fill #0

## 2022-07-21 MED ORDER — CEPHALEXIN 500 MG PO CAPS
500.0000 mg | ORAL_CAPSULE | Freq: Four times a day (QID) | ORAL | 0 refills | Status: AC
  Filled 2022-07-21: qty 12, 3d supply, fill #0

## 2022-07-21 NOTE — Progress Notes (Signed)
Patient ID: [name] [name], female    DOB: 12/04/1955, 67 y.o.   MRN: [id number]  Chief Complaint  Patient presents with   Pre-op Exam      ICD-10-CM   1. Encounter for counseling  Z71.9       History of Present Illness: [name] [name] is a 67 y.o.  female  with a history of breast reduction 30 years ago in [place].  She presents for preoperative evaluation for upcoming procedure, bilateral liposuction of lateral breast and axilla, scheduled for 08/17/22 with Dr. Marla Roe.  The patient has not had problems with anesthesia. No history of DVT/PE.  No family history of DVT/PE.  No family or personal history of bleeding or clotting disorders.  Patient is not currently taking any blood thinners.  No history of CVA/MI.   Summary of Previous Visit: 67 year old female underwent breast reduction 30 years ago in [place], 70 triple D or prior to surgery she is now about a C cup.  She complains about excess skin and soft tissue in the lateral aspect of the breast and axilla.  She did mention she has a lot of skin damage from the sun.  She is not a smoker, last mammogram was 11 months ago. Because she is over 60 would like to have preop clearance from PCP.   Job: Scientist, water quality at [employer] Significant for: Asthma - well controlled - uses albuterol prior to running. Hx of alcohol cirrhosis. Hx of GERD. HTN.  She reports she is exercising multiple times per week, she runs 3 to 4 miles 3x/week and lifts weights other days. She denies any cardiac or pulmonary symptoms. She reports her asthma is well controlled.  She reports her mammogram is scheduled for August 05, 2022.  Her last mammogram was 07/03/2021 which was negative, BI-RADS 1.  She reports she is scheduled to fly to [facility] the Friday following surgery, 08/21/22.   Past Medical History: Allergies: Allergies  Allergen Reactions   Lisinopril     REACTION: unspecified   Sulfamethoxazole-Trimethoprim     REACTION:  unspecified    Current Medications:  Current Outpatient Medications:    albuterol (VENTOLIN HFA) 108 (90 Base) MCG/ACT inhaler, Inhale 2 puffs into the lungs every 4 hours as needed, Disp: 6.7 g, Rfl: 5   amLODipine (NORVASC) 5 MG tablet, Take 1 tablet (5 mg total) by mouth daily., Disp: 90 tablet, Rfl: 4   b complex vitamins tablet, Take 1 tablet by mouth daily., Disp: , Rfl:    cephALEXin (KEFLEX) 500 MG capsule, Take 1 capsule (500 mg total) by mouth 4 (four) times daily for 3 days., Disp: 12 capsule, Rfl: 0   fluticasone (FLONASE) 50 MCG/ACT nasal spray, Place 2 sprays into both nostrils daily., Disp: 48 g, Rfl: 3   furosemide (LASIX) 40 MG tablet, Take 1 tablet (40 mg total) by mouth daily., Disp: 90 tablet, Rfl: 3   LUTEIN-ZEAXANTHIN PO, Take by mouth daily., Disp: , Rfl:    omeprazole (PRILOSEC OTC) 20 MG tablet, Take 20 mg by mouth daily., Disp: , Rfl:    ondansetron (ZOFRAN) 4 MG tablet, Take 1 tablet (4 mg total) by mouth every 8 (eight) hours as needed for nausea or vomiting., Disp: 20 tablet, Rfl: 0   oxyCODONE (OXY IR/ROXICODONE) 5 MG immediate release tablet, Take 1 tablet (5 mg total) by mouth every 6 (six) hours as needed for up to 5 days for severe pain., Disp: 20 tablet, Rfl: 0  Prenatal Multivit-Min-Fe-FA (PRE-NATAL FORMULA) TABS, Take 1 tablet by mouth daily., Disp: , Rfl:    Probiotic Product (PROBIOTIC DAILY PO), Take by mouth daily., Disp: , Rfl:    spironolactone (ALDACTONE) 50 MG tablet, Take 1 tablet (50 mg total) by mouth daily., Disp: 90 tablet, Rfl: 3   Vitamin D, Ergocalciferol, (DRISDOL) 1.25 MG (50000 UNIT) CAPS capsule, Take 1 capsule (50,000 Units total) by mouth every 7 (seven) days., Disp: 12 capsule, Rfl: 0  Past Medical Problems: Past Medical History:  Diagnosis Date   Allergy    mild   Arthritis    Asthma    Cancer ([place])    bladder cancer   Cirrhosis, alcoholic (HCC)    Esophageal varices (HCC)    GERD (gastroesophageal reflux disease)     Hypertension    Hyponatremia    Psoriasis     Past Surgical History: Past Surgical History:  Procedure Laterality Date   BASAL CELL CARCINOMA EXCISION Right    Ear   BLADDER SURGERY     Tumor removed    BREAST REDUCTION SURGERY     Bilateral    COLONOSCOPY     REDUCTION MAMMAPLASTY     UPPER GASTROINTESTINAL ENDOSCOPY      Social History: Social History   Socioeconomic History   Marital status: Married    Spouse name: Not on file   Number of children: Not on file   Years of education: Not on file   Highest education level: Not on file  Occupational History   Not on file  Tobacco Use   Smoking status: Never   Smokeless tobacco: Never  Vaping Use   Vaping Use: Never used  Substance and Sexual Activity   Alcohol use: No   Drug use: No   Sexual activity: Not on file  Other Topics Concern   Not on file  Social History Narrative   Not on file   Social Determinants of Health   Financial Resource Strain: Not on file  Food Insecurity: Not on file  Transportation Needs: Not on file  Physical Activity: Not on file  Stress: Not on file  Social Connections: Not on file  Intimate Partner Violence: Not on file    Family History: Family History  Problem Relation Age of Onset   Diabetes Paternal Grandmother    Diabetes Paternal Grandfather    Colon cancer Neg Hx    Colon polyps Neg Hx    Esophageal cancer Neg Hx    Rectal cancer Neg Hx    Stomach cancer Neg Hx     Review of Systems: Review of Systems  Constitutional: Negative.   Respiratory: Negative.    Cardiovascular: Negative.   Gastrointestinal: Negative.   Neurological: Negative.    Physical Exam: Vital Signs BP (!) 164/106 (BP Location: Left Arm, Patient Position: Sitting, Cuff Size: Large)   Pulse 70   Ht '5\' 3"'$  (1.6 m)   Wt 143 lb (64.9 kg)   SpO2 99%   BMI 25.33 kg/m   Physical Exam Constitutional:      General: Not in acute distress.    Appearance: Normal appearance. Not ill-appearing.   HENT:     Head: Normocephalic and atraumatic.  Eyes:     Pupils: Pupils are equal, round Neck:     Musculoskeletal: Normal range of motion.  Cardiovascular:     Rate and Rhythm: Normal rate    Pulses: Normal pulses.  Pulmonary:     Effort: Pulmonary effort is normal. No respiratory distress.  Abdominal:     General: Abdomen is flat. There is no distension.  Musculoskeletal: Normal range of motion.  Skin:    General: Skin is warm and dry.     Findings: No erythema or rash.  Neurological:     General: No focal deficit present.     Mental Status: Alert and oriented to person, place, and time. Mental status is at baseline.     Motor: No weakness.  Psychiatric:        Mood and Affect: Mood normal.        Behavior: Behavior normal.    Assessment/Plan: The patient is scheduled for liposuction of lateral breast and axillary tissue with Dr. Marla Roe.  Risks, benefits, and alternatives of procedure discussed, questions answered and consent obtained.    Smoking Status: non smoker; Counseling Given? N/a Last Mammogram: Scheduled for January 31; Results: not performed yet  Caprini Score: 6, high; Risk Factors include: Age, history of bladder cancer, BMI > 25, and length of planned surgery. Recommendation for mechanical prophylaxis. Encourage early ambulation.   We discussed that traveling shortly after surgery does increase her risk of DVT/PE, particularly traveling the airplane. Encouraged ambulation throughout flight. Patient was understanding of the risk  Pictures obtained: '@consult'$   Post-op Rx sent to pharmacy: Oxycodone, Zofran, Keflex  Patient was provided with the General Surgical Risk consent document and Pain Medication Agreement prior to their appointment.  They had adequate time to read through the risk consent documents and Pain Medication Agreement. We also discussed them in person together during this preop appointment. All of their questions were answered to their  satisfaction.  Recommended calling if they have any further questions.  Risk consent form and Pain Medication Agreement to be scanned into patient's chart.  The risks that can be encountered with and after liposuction were discussed and include the following but no limited to these:  Asymmetry, fluid accumulation, firmness of the area, fat necrosis with death of fat tissue, bleeding, infection, delayed healing, anesthesia risks, skin sensation changes, injury to structures including nerves, blood vessels, and muscles which may be temporary or permanent, allergies to tape, suture materials and glues, blood products, topical preparations or injected agents, skin and contour irregularities, skin discoloration and swelling, deep vein thrombosis, cardiac and pulmonary complications, pain, which may persist, persistent pain, recurrence of the lesion, poor healing of the incision, possible need for revisional surgery or staged procedures. Thiere can also be persistent swelling, poor wound healing, rippling or loose skin, worsening of cellulite, swelling, and thermal burn or heat injury from ultrasound with the ultrasound-assisted lipoplasty technique. Any change in weight fluctuations can alter the outcome.  Will send PCP clearance   Electronically signed by: Carola Rhine Nohemy Koop, PA-C 07/21/2022 12:36 PM

## 2022-07-22 ENCOUNTER — Telehealth: Payer: Self-pay

## 2022-07-22 ENCOUNTER — Telehealth: Payer: Self-pay | Admitting: Internal Medicine

## 2022-07-22 NOTE — Telephone Encounter (Signed)
Surgical Clearance PW has been received and placed in Dr. Charlynne Cousins boxes.   Upon completion please fax to: [phone]

## 2022-07-22 NOTE — Telephone Encounter (Signed)
error 

## 2022-07-22 NOTE — Telephone Encounter (Signed)
Faxed surgical clearance for liposuction of bl breast on and axilla 08/17/22 to Pricilla Holm, MD.

## 2022-07-22 NOTE — Telephone Encounter (Signed)
Rec'd surgical place in MD office in her inbox on window.Marland KitchenJohny Chess

## 2022-07-23 ENCOUNTER — Telehealth: Payer: Self-pay

## 2022-07-23 NOTE — Telephone Encounter (Signed)
Surgical Clearance Papers placed in Dr Nathanial Millman office box.

## 2022-07-27 DIAGNOSIS — M25671 Stiffness of right ankle, not elsewhere classified: Secondary | ICD-10-CM | POA: Diagnosis not present

## 2022-07-27 DIAGNOSIS — M76821 Posterior tibial tendinitis, right leg: Secondary | ICD-10-CM | POA: Diagnosis not present

## 2022-07-27 DIAGNOSIS — M7661 Achilles tendinitis, right leg: Secondary | ICD-10-CM | POA: Diagnosis not present

## 2022-07-27 DIAGNOSIS — R260 Ataxic gait: Secondary | ICD-10-CM | POA: Diagnosis not present

## 2022-08-03 DIAGNOSIS — M25671 Stiffness of right ankle, not elsewhere classified: Secondary | ICD-10-CM | POA: Diagnosis not present

## 2022-08-03 DIAGNOSIS — R260 Ataxic gait: Secondary | ICD-10-CM | POA: Diagnosis not present

## 2022-08-03 DIAGNOSIS — M76821 Posterior tibial tendinitis, right leg: Secondary | ICD-10-CM | POA: Diagnosis not present

## 2022-08-03 DIAGNOSIS — M7661 Achilles tendinitis, right leg: Secondary | ICD-10-CM | POA: Diagnosis not present

## 2022-08-04 ENCOUNTER — Encounter: Payer: Self-pay | Admitting: Internal Medicine

## 2022-08-04 ENCOUNTER — Ambulatory Visit: Payer: Commercial Managed Care - PPO | Admitting: Internal Medicine

## 2022-08-04 ENCOUNTER — Telehealth: Payer: Self-pay | Admitting: *Deleted

## 2022-08-04 VITALS — BP 138/88 | HR 70 | Temp 97.6°F | Ht 63.0 in | Wt 149.0 lb

## 2022-08-04 DIAGNOSIS — Z01818 Encounter for other preprocedural examination: Secondary | ICD-10-CM

## 2022-08-04 DIAGNOSIS — Z0000 Encounter for general adult medical examination without abnormal findings: Secondary | ICD-10-CM | POA: Insufficient documentation

## 2022-08-04 NOTE — Patient Instructions (Signed)
Your EKG looks normal

## 2022-08-04 NOTE — Telephone Encounter (Signed)
Fax received from Dr. Nathanial Millman office stating that pt is cleared for surgery. Copy given to [facility] and one sent to batch scan

## 2022-08-04 NOTE — Assessment & Plan Note (Signed)
EKG done which is improved from prior. BP initially elevated but recheck normal. Prior BP normal. Cleared low risk for upcoming surgical procedure. Labs not indicated. Counseled about risk of constipation if opioids are needed post-op.

## 2022-08-04 NOTE — Progress Notes (Signed)
   Subjective:   Patient ID: [name] [name], female    DOB: October 12, 1955, 67 y.o.   MRN: [id number]  HPI The patient is a 67 YO female coming in for pre-op. No chest pains. Having liposuction in next 2 weeks.  Review of Systems  Constitutional: Negative.   HENT: Negative.    Eyes: Negative.   Respiratory:  Negative for cough, chest tightness and shortness of breath.   Cardiovascular:  Negative for chest pain, palpitations and leg swelling.  Gastrointestinal:  Negative for abdominal distention, abdominal pain, constipation, diarrhea, nausea and vomiting.  Musculoskeletal: Negative.   Skin: Negative.   Neurological: Negative.   Psychiatric/Behavioral: Negative.      Objective:  Physical Exam Constitutional:      Appearance: She is well-developed.  HENT:     Head: Normocephalic and atraumatic.  Cardiovascular:     Rate and Rhythm: Normal rate and regular rhythm.  Pulmonary:     Effort: Pulmonary effort is normal. No respiratory distress.     Breath sounds: Normal breath sounds. No wheezing or rales.  Abdominal:     General: Bowel sounds are normal. There is no distension.     Palpations: Abdomen is soft.     Tenderness: There is no abdominal tenderness. There is no rebound.  Musculoskeletal:     Cervical back: Normal range of motion.  Skin:    General: Skin is warm and dry.  Neurological:     Mental Status: She is alert and oriented to person, place, and time.     Coordination: Coordination normal.     Vitals:   08/04/22 1400  BP: (!) 148/96  Pulse: 70  Temp: 97.6 F (36.4 C)  TempSrc: Temporal  SpO2: 97%  Weight: 149 lb (67.6 kg)  Height: '5\' 3"'$  (1.6 m)  EKG: Rate 63, axis normal, interval normal, sinus, no st or t wave changes, no significant change compared to prior 2016, prior borderline QT interval resolved and prior ST depression resolved   Assessment & Plan:

## 2022-08-05 ENCOUNTER — Ambulatory Visit
Admission: RE | Admit: 2022-08-05 | Discharge: 2022-08-05 | Disposition: A | Discharge status: Home / Self Care | Payer: Commercial Managed Care - PPO | Source: Ambulatory Visit

## 2022-08-05 DIAGNOSIS — Z1231 Encounter for screening mammogram for malignant neoplasm of breast: Secondary | ICD-10-CM

## 2022-08-06 ENCOUNTER — Encounter: Payer: Self-pay | Admitting: Surgical

## 2022-08-06 NOTE — Progress Notes (Signed)
Surgical Clearance has been received from Dr. Pricilla Holm, MD for patient's upcoming surgery with Dr. Marla Roe on 08/17/2022.  Patient is medically cleared for surgery.

## 2022-08-07 ENCOUNTER — Other Ambulatory Visit: Payer: Self-pay | Admitting: Internal Medicine

## 2022-08-07 ENCOUNTER — Encounter: Payer: Self-pay | Admitting: Dietician

## 2022-08-07 ENCOUNTER — Encounter: Payer: Commercial Managed Care - PPO | Attending: Internal Medicine | Admitting: Dietician

## 2022-08-07 ENCOUNTER — Encounter: Payer: Self-pay | Admitting: Internal Medicine

## 2022-08-07 VITALS — Ht 63.0 in

## 2022-08-07 DIAGNOSIS — R928 Other abnormal and inconclusive findings on diagnostic imaging of breast: Secondary | ICD-10-CM

## 2022-08-07 DIAGNOSIS — I10 Essential (primary) hypertension: Secondary | ICD-10-CM | POA: Insufficient documentation

## 2022-08-07 NOTE — Progress Notes (Signed)
Medical Nutrition Therapy  Appointment Start time:  104  Appointment End time:  0818  Primary concerns today: Pt wants to learn about how she should be eating and is worried about her sugar cravings.    Referral diagnosis: hypertension Preferred learning style: no preference indicated Learning readiness: ready   NUTRITION ASSESSMENT   Anthropometrics  Ht: 63 in Wt: not assessed  Clinical Medical Hx: allergies, arthritis, asthma, cancer, GERD, HTN.  Medications: reviewed Labs: reviewed Notable Signs/Symptoms: none Food Allergies: none  Lifestyle & Dietary Hx  Pt lives with husband and husband does most of the cooking but pt packs her breakfast and lunch for work.   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   Pt states she eats very well until late afternoon or evening and then eats cookies and candy.  Pt states she is a long-distance runner and weight trains.   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   Pt gets up at 4am and and works out before work. On the weekends she goes on distance runs and long walks.   Pt goes to bed at 8:15pm.   Estimated daily fluid intake: 64 oz Supplements: probiotic, calcium, vitamin D3 Sleep: 7 hours Stress / self-care: moderate stress Current average weekly physical activity: 200+ minutes   24-Hr Dietary Recall Pre-workout: 4am: peanut butter protein oat ball Post-workout: 6:30am: 1/2 of a protein shake Breakfast: 8:30am greek yogurt, granola, almonds Snack: 11am banana Second Meal: 12pm salad with lettuce, vegetables, and fruit Snack: none Third Meal: 6:30pm grilled fish or chicken with vegetable and starch OR spaghetti and meatballs Snack: 8 dove chocolates Beverages: coffee, water   NUTRITION DIAGNOSIS  NB-1.1 Food and nutrition-related knowledge deficit As related to lack of prior nutrition education by a nutrition professional.  As evidenced by pt report  and diet history.   NUTRITION INTERVENTION  Nutrition education (E-1) on the following topics:  Building balanced meals and snacks MyPlate Benefits of physical activity Impact of stress on health Hypertension nutrition therapy Strategies to lower saturated fat and sodium Importance of consistent meal times Importance of adequate and consistent protein for maintaining muscle mass and satiety  Handouts Provided Include  Nutrition Care Manual: Hypertension Nutrition Therapy Dish Up a Healthy Meal  Learning Style & Readiness for Change Teaching method utilized: Visual & Auditory  Demonstrated degree of understanding via: Teach Back  Barriers to learning/adherence to lifestyle change: none  Goals Established by Pt Aim for 150 minutes of physical activity weekly.  Aim to make 1/2 of your plate vegetables and/or fruit at least 2x/day  Goal: include a protein in your lunch: examples low-fat cheese, tuna salad, leftover chicken, nuts, etc.   Goal: eat consistently on the weekends (breakfast, lunch and dinner)  Consider leaving the candy bag downstairs and take what you want with you.   Instead of ginger ale try sparkling water. My favorite sparkling water brand: Spindrift    MONITORING & EVALUATION Dietary intake, weekly physical activity, and follow up in 3 months.  Next Steps  Patient is to call for questions.

## 2022-08-07 NOTE — Patient Instructions (Addendum)
Aim for 150 minutes of physical activity weekly.  Aim to make 1/2 of your plate vegetables and/or fruit at least 2x/day  Goal: include a protein in your lunch: examples low-fat cheese, tuna salad, leftover chicken, nuts, etc.   Goal: eat consistently on the weekends (breakfast, lunch and dinner)  Consider leaving the candy bag downstairs and take what you want with you.   Instead of ginger ale try sparkling water. My favorite sparkling water brand: Spindrift

## 2022-08-10 ENCOUNTER — Encounter: Payer: Self-pay | Admitting: Plastic Surgery

## 2022-08-10 ENCOUNTER — Ambulatory Visit
Admission: RE | Admit: 2022-08-10 | Discharge: 2022-08-10 | Disposition: A | Discharge status: Home / Self Care | Payer: Commercial Managed Care - PPO | Source: Ambulatory Visit | Attending: Internal Medicine | Admitting: Internal Medicine

## 2022-08-10 DIAGNOSIS — R928 Other abnormal and inconclusive findings on diagnostic imaging of breast: Secondary | ICD-10-CM

## 2022-08-10 DIAGNOSIS — M76821 Posterior tibial tendinitis, right leg: Secondary | ICD-10-CM | POA: Diagnosis not present

## 2022-08-10 DIAGNOSIS — R260 Ataxic gait: Secondary | ICD-10-CM | POA: Diagnosis not present

## 2022-08-10 DIAGNOSIS — M7661 Achilles tendinitis, right leg: Secondary | ICD-10-CM | POA: Diagnosis not present

## 2022-08-10 DIAGNOSIS — N6489 Other specified disorders of breast: Secondary | ICD-10-CM | POA: Diagnosis not present

## 2022-08-10 DIAGNOSIS — M25671 Stiffness of right ankle, not elsewhere classified: Secondary | ICD-10-CM | POA: Diagnosis not present

## 2022-08-10 DIAGNOSIS — R922 Inconclusive mammogram: Secondary | ICD-10-CM | POA: Diagnosis not present

## 2022-08-17 ENCOUNTER — Other Ambulatory Visit: Payer: Commercial Managed Care - PPO

## 2022-08-17 DIAGNOSIS — Z411 Encounter for cosmetic surgery: Secondary | ICD-10-CM

## 2022-08-18 ENCOUNTER — Ambulatory Visit (INDEPENDENT_AMBULATORY_CARE_PROVIDER_SITE_OTHER): Payer: Self-pay | Admitting: Physician Assistant

## 2022-08-18 DIAGNOSIS — Z719 Counseling, unspecified: Secondary | ICD-10-CM

## 2022-08-18 NOTE — Progress Notes (Signed)
This is a 67 year old female evaluated via telehealth postop day 1 status post bilateral liposuction of lateral breast and axilla by Dr. Marla Roe on 08/17/2022.  The patient notes that she is doing well with no significant complaints or concerns, she notes she has not had to take any of the pain medication.  She has been taking her antibiotics.  Physical exam.  Telehealth visit, speaking in full sentences in no acute distress.  Overall the patient is doing well with no issues or concerns.  She did note that her binder was slightly loose.  I encouraged her to attempt tightening it and add additional padding as needed, she also has a sports bra which she feels is tighter than the binder that would be okay at this time to move into.  The patient has a follow-up evaluation with Dr. Marla Roe in the office in approximately 1 week, she is encouraged to reach out to our office if she develops any concerning signs or symptoms in the meantime.  She verbalized understanding and agreement to today's plan.

## 2022-08-25 ENCOUNTER — Encounter: Payer: Self-pay | Admitting: Plastic Surgery

## 2022-08-25 ENCOUNTER — Ambulatory Visit (INDEPENDENT_AMBULATORY_CARE_PROVIDER_SITE_OTHER): Payer: Self-pay | Admitting: Plastic Surgery

## 2022-08-25 VITALS — BP 146/93 | HR 68

## 2022-08-25 DIAGNOSIS — Z719 Counseling, unspecified: Secondary | ICD-10-CM

## 2022-08-25 NOTE — Progress Notes (Signed)
The patient is a 67 year old female here for follow-up after undergoing liposuction of the lateral breast area.  Overall she is doing really well.  She is got quite a bit of bruising and swelling as expected.  She has been wearing the binder with the TopiFoam.  She can go to the sports bra during the day and use the TopiFoam and the binder in the evening.  Her bowels are moving and her pain is well-controlled.  Will plan to see her back in a couple of weeks.  She knows to let us know if she has any questions or concerns.  Will get pictures at her next visit.

## 2022-08-26 ENCOUNTER — Encounter: Payer: Self-pay | Admitting: Plastic Surgery

## 2022-08-27 ENCOUNTER — Encounter: Payer: Self-pay | Admitting: Surgical

## 2022-08-30 ENCOUNTER — Other Ambulatory Visit (HOSPITAL_BASED_OUTPATIENT_CLINIC_OR_DEPARTMENT_OTHER): Payer: Self-pay

## 2022-08-31 ENCOUNTER — Other Ambulatory Visit (HOSPITAL_BASED_OUTPATIENT_CLINIC_OR_DEPARTMENT_OTHER): Payer: Self-pay

## 2022-08-31 ENCOUNTER — Other Ambulatory Visit: Payer: Self-pay

## 2022-08-31 MED ORDER — AMLODIPINE BESYLATE 5 MG PO TABS
5.0000 mg | ORAL_TABLET | Freq: Every day | ORAL | 3 refills | Status: DC
  Filled 2022-08-31: qty 90, 90d supply, fill #0
  Filled 2022-11-24: qty 90, 90d supply, fill #1
  Filled 2023-03-08: qty 90, 90d supply, fill #2
  Filled 2023-05-28: qty 90, 90d supply, fill #3

## 2022-09-08 ENCOUNTER — Ambulatory Visit (INDEPENDENT_AMBULATORY_CARE_PROVIDER_SITE_OTHER): Payer: Self-pay | Admitting: Surgical

## 2022-09-08 DIAGNOSIS — Z719 Counseling, unspecified: Secondary | ICD-10-CM

## 2022-09-08 NOTE — Progress Notes (Signed)
Patient here for follow up after liposuction of lateral breasts and bilateral axilla on 08/17/22. She is 3 weeks post-op  She reports overall she is doing well.  She has been wearing compressive garments.  She is not having any infectious symptoms.  She reports overall she is happy with the outcome, does feel as if she has some fullness still present in the right lateral breast.   Chaperone present on exam On exam previous breast reduction incisions are noted and well-healed.  NAC's are viable.  Lateral breasts with some mild swelling noted, right greater than left.  No subcutaneous fluid collections noted.  No erythema or cellulitic changes noted.  A/P:  Continue with compressive garments, which needs activities or heavy lifting for 3 more weeks. She can transition to wearing compressive garments only during the day after 3 more weeks. There is no signs infection or concern on exam.  Recommend light massage to the right lateral breast where she has some postsurgical swelling still present, but I do not appreciate any fluid collections on either side.  Recommend following up as needed.  Pictures were obtained of the patient and placed in the chart with the patient's or guardian's permission.

## 2022-09-11 ENCOUNTER — Ambulatory Visit: Payer: Commercial Managed Care - PPO | Admitting: Podiatry

## 2022-09-17 ENCOUNTER — Ambulatory Visit: Payer: Commercial Managed Care - PPO | Admitting: Podiatry

## 2022-09-18 ENCOUNTER — Other Ambulatory Visit (HOSPITAL_BASED_OUTPATIENT_CLINIC_OR_DEPARTMENT_OTHER): Payer: Self-pay

## 2022-09-18 DIAGNOSIS — M67871 Other specified disorders of synovium, right ankle and foot: Secondary | ICD-10-CM | POA: Diagnosis not present

## 2022-09-18 MED ORDER — PREDNISONE 10 MG PO TABS
ORAL_TABLET | ORAL | 0 refills | Status: DC
  Filled 2022-09-18: qty 14, 6d supply, fill #0

## 2022-09-22 ENCOUNTER — Encounter: Payer: Self-pay | Admitting: Surgical

## 2022-10-14 ENCOUNTER — Telehealth: Payer: Self-pay

## 2022-10-14 DIAGNOSIS — K7030 Alcoholic cirrhosis of liver without ascites: Secondary | ICD-10-CM

## 2022-10-14 NOTE — Telephone Encounter (Signed)
Labs and [place] has been entered. [place] order sent to the schedulers. The pt has been advised of the need for labs and [place].  She will be contacted with appt for [place]

## 2022-10-14 NOTE — Telephone Encounter (Signed)
-----   Message from Loretha Stapler, RN sent at 04/14/2022  4:36 PM EDT ----- repeat same [place] in 6 months (jacobs)

## 2022-10-16 DIAGNOSIS — M67871 Other specified disorders of synovium, right ankle and foot: Secondary | ICD-10-CM | POA: Diagnosis not present

## 2022-10-20 ENCOUNTER — Ambulatory Visit (HOSPITAL_BASED_OUTPATIENT_CLINIC_OR_DEPARTMENT_OTHER)
Admission: RE | Admit: 2022-10-20 | Discharge: 2022-10-20 | Disposition: A | Discharge status: Home / Self Care | Payer: Commercial Managed Care - PPO | Source: Ambulatory Visit | Attending: Internal Medicine | Admitting: Internal Medicine

## 2022-10-20 DIAGNOSIS — K7030 Alcoholic cirrhosis of liver without ascites: Secondary | ICD-10-CM

## 2022-10-20 DIAGNOSIS — K7460 Unspecified cirrhosis of liver: Secondary | ICD-10-CM | POA: Diagnosis not present

## 2022-10-21 ENCOUNTER — Ambulatory Visit (HOSPITAL_BASED_OUTPATIENT_CLINIC_OR_DEPARTMENT_OTHER): Payer: Commercial Managed Care - PPO

## 2022-10-21 ENCOUNTER — Other Ambulatory Visit (INDEPENDENT_AMBULATORY_CARE_PROVIDER_SITE_OTHER): Payer: Commercial Managed Care - PPO

## 2022-10-21 DIAGNOSIS — K7030 Alcoholic cirrhosis of liver without ascites: Secondary | ICD-10-CM

## 2022-10-21 LAB — CBC WITH DIFFERENTIAL/PLATELET
Basophils Absolute: 0.1 10*3/uL (ref 0.0–0.1)
Basophils Relative: 1.1 % (ref 0.0–3.0)
Eosinophils Absolute: 0.1 10*3/uL (ref 0.0–0.7)
Eosinophils Relative: 0.9 % (ref 0.0–5.0)
HCT: 44.9 % (ref 36.0–46.0)
Hemoglobin: 15.6 g/dL — ABNORMAL HIGH (ref 12.0–15.0)
Lymphocytes Relative: 28.5 % (ref 12.0–46.0)
Lymphs Abs: 2.3 10*3/uL (ref 0.7–4.0)
MCHC: 34.7 g/dL (ref 30.0–36.0)
MCV: 93.4 fl (ref 78.0–100.0)
Monocytes Absolute: 0.6 10*3/uL (ref 0.1–1.0)
Monocytes Relative: 7.8 % (ref 3.0–12.0)
Neutro Abs: 5.1 10*3/uL (ref 1.4–7.7)
Neutrophils Relative %: 61.7 % (ref 43.0–77.0)
Platelets: 280 10*3/uL (ref 150.0–400.0)
RBC: 4.81 Mil/uL (ref 3.87–5.11)
RDW: 13.8 % (ref 11.5–15.5)
WBC: 8.2 10*3/uL (ref 4.0–10.5)

## 2022-10-21 LAB — COMPREHENSIVE METABOLIC PANEL
ALT: 21 U/L (ref 0–35)
AST: 25 U/L (ref 0–37)
Albumin: 4.5 g/dL (ref 3.5–5.2)
Alkaline Phosphatase: 96 U/L (ref 39–117)
BUN: 14 mg/dL (ref 6–23)
CO2: 29 mEq/L (ref 19–32)
Calcium: 9.8 mg/dL (ref 8.4–10.5)
Chloride: 99 mEq/L (ref 96–112)
Creatinine, Ser: 0.83 mg/dL (ref 0.40–1.20)
GFR: 72.7 mL/min (ref >60.00)
Glucose, Bld: 91 mg/dL (ref 70–99)
Potassium: 3.7 mEq/L (ref 3.5–5.1)
Sodium: 139 mEq/L (ref 135–145)
Total Bilirubin: 1 mg/dL (ref 0.2–1.2)
Total Protein: 7.4 g/dL (ref 6.0–8.3)

## 2022-10-21 LAB — PROTIME-INR
INR: 1.3 ratio — ABNORMAL HIGH (ref 0.8–1.0)
Prothrombin Time: 14.1 s — ABNORMAL HIGH (ref 9.6–13.1)

## 2022-10-22 LAB — AFP TUMOR MARKER: AFP-Tumor Marker: 3.6 ng/mL

## 2022-10-26 ENCOUNTER — Telehealth: Payer: Self-pay | Admitting: Internal Medicine

## 2022-10-26 NOTE — Telephone Encounter (Signed)
I called patient and reviewed recent lab tests and ultrasound which all look good.  She has stable cirrhosis of course.  She continues to feel well.  She is not running right now but is doing an intermittent fasting type diet and has lost 13 pounds.  We discussed that should her liver deteriorate that could be an issue but she is a child a and has good functional status and is tolerating that for several weeks now if not longer.  She will follow-up with a visit in a year, but we will repeat labs and ultrasound in 6 months.  Staff-please put a reminder in for the ultrasound and labs in 6 months-placed a reminder for first week of October.    I will continue to follow her in Dr. Larae Grooms absence and we will make her one of my patients.

## 2022-10-27 DIAGNOSIS — M25571 Pain in right ankle and joints of right foot: Secondary | ICD-10-CM | POA: Diagnosis not present

## 2022-10-27 NOTE — Telephone Encounter (Signed)
Please assist with changing  the status in our system to reflect that she is Dr. Leone Payor  patient.

## 2022-11-02 DIAGNOSIS — M898X7 Other specified disorders of bone, ankle and foot: Secondary | ICD-10-CM | POA: Diagnosis not present

## 2022-11-02 DIAGNOSIS — M6701 Short Achilles tendon (acquired), right ankle: Secondary | ICD-10-CM | POA: Diagnosis not present

## 2022-11-02 DIAGNOSIS — M67871 Other specified disorders of synovium, right ankle and foot: Secondary | ICD-10-CM | POA: Diagnosis not present

## 2022-11-06 ENCOUNTER — Encounter: Payer: Self-pay | Admitting: Dietician

## 2022-11-06 ENCOUNTER — Encounter: Payer: Commercial Managed Care - PPO | Attending: Internal Medicine | Admitting: Dietician

## 2022-11-06 DIAGNOSIS — I10 Essential (primary) hypertension: Secondary | ICD-10-CM | POA: Insufficient documentation

## 2022-11-06 DIAGNOSIS — Z713 Dietary counseling and surveillance: Secondary | ICD-10-CM | POA: Insufficient documentation

## 2022-11-06 NOTE — Progress Notes (Signed)
Medical Nutrition Therapy  Appointment Start time:  34  Appointment End time:  [phone]  Primary concerns: Pt wants to learn about how she should be eating and is worried about her sugar cravings.    Referral diagnosis: hypertension Preferred learning style: no preference indicated Learning readiness: ready   NUTRITION ASSESSMENT   Anthropometrics  Ht: 63 in Wt: not assessed  Clinical Medical Hx: allergies, arthritis, asthma, cancer, GERD, HTN.  Medications: reviewed Labs: reviewed Notable Signs/Symptoms: none Food Allergies: none  Lifestyle & Dietary Hx  Pt states she's had some great progress since the previous visit.  Pt states she started the [employer] diet and fitness program. Pt states she is following the diet strictly. Pt states it includes a 16 hr fasting window, no sugar, no carbs (including no whole grains or starchy vegetables), no fruit, no tomatoes, no dairy, 1 gallon of water. Meals must consist of protein, vegetable and healthy fat. Pt states there is 1 'celebration meal' weekly where you get to eat anything you want.   Pt states she lost 14 lbs from March to May. Pt states she wants to lose 3-5 more lbs and then she will reassess her diet.   Pt reports she is no longer running right now due to an achilles injury. Pt reports she has been strength training.   Estimated daily fluid intake: 64-128 oz Supplements: probiotic, calcium, vitamin D3 Sleep: 7 hours Stress / self-care: moderate stress Current average weekly physical activity: 200+ minutes   24-Hr Dietary Recall Breakfast: 9:30am: 2 boiled eggs, carrots and celery, 1/4 - 1/2 c mixed nuts Snack: none Second Meal: 12pm salad with lettuce, vegetables, with chicken and avocado or nuts Snack: none Third Meal: 5:30pm grilled fish or chicken with vegetables and nuts Snack: none Beverages: 2-3 c coffee, water   NUTRITION DIAGNOSIS  NB-1.1 Food and nutrition-related knowledge deficit As related to lack of prior  nutrition education by a nutrition professional.  As evidenced by pt report and diet history.   NUTRITION INTERVENTION  Nutrition education (E-1) on the following topics:  Discussed red flags of yo-yo dieting with patient and explained that any diet that completely removes 1 or more food groups tends to be unsustainable Advised against intermittent fasting as pt gets up at 4-5am and recommended pt eat within 1-2 hours of waking and every 3-5 hours following Discussed the importance of carbohydrate-containing foods such as fruits, whole grains, and non-starchy vegetables and encouraged pt to add these back into her diet Importance of carbohydrates in the diet for energy, especially during exercise and for building muscle  Handouts Provided Include  No handouts provided on this follow up  Learning Style & Readiness for Change Teaching method utilized: Visual & Auditory  Demonstrated degree of understanding via: Teach Back  Barriers to learning/adherence to lifestyle change: none  Goals Established by Pt Previous goals: continue  Aim for 150 minutes of physical activity weekly. - goal met, continue.  Aim to make 1/2 of your plate vegetables and/or fruit at least 2x/day - goal not met, continue.  Goal: include a protein in your lunch: examples low-fat cheese, tuna salad, leftover chicken, nuts, etc. - goal met, continue.  Goal: eat consistently on the weekends (breakfast, lunch and dinner) - goal partially met, continue.  Consider leaving the candy bag downstairs and take what you want with you. - goal met, continue.   Instead of ginger ale try sparkling water. - goal met, continue.   MONITORING & EVALUATION Dietary intake, weekly  physical activity, and follow up PRN.  Next Steps  Patient is to call for questions.

## 2022-11-23 DIAGNOSIS — M67871 Other specified disorders of synovium, right ankle and foot: Secondary | ICD-10-CM | POA: Diagnosis not present

## 2022-11-24 ENCOUNTER — Other Ambulatory Visit (HOSPITAL_BASED_OUTPATIENT_CLINIC_OR_DEPARTMENT_OTHER): Payer: Self-pay

## 2022-11-24 ENCOUNTER — Other Ambulatory Visit: Payer: Self-pay | Admitting: Gastroenterology

## 2022-11-24 MED ORDER — SPIRONOLACTONE 50 MG PO TABS
50.0000 mg | ORAL_TABLET | Freq: Every day | ORAL | 3 refills | Status: DC
  Filled 2022-11-24: qty 90, 90d supply, fill #0
  Filled 2023-02-19: qty 90, 90d supply, fill #1
  Filled 2023-05-20 (×2): qty 90, 90d supply, fill #2

## 2022-11-25 ENCOUNTER — Other Ambulatory Visit: Payer: Self-pay

## 2022-11-25 ENCOUNTER — Other Ambulatory Visit (HOSPITAL_BASED_OUTPATIENT_CLINIC_OR_DEPARTMENT_OTHER): Payer: Self-pay

## 2022-12-14 DIAGNOSIS — M67871 Other specified disorders of synovium, right ankle and foot: Secondary | ICD-10-CM | POA: Diagnosis not present

## 2022-12-16 DIAGNOSIS — M67871 Other specified disorders of synovium, right ankle and foot: Secondary | ICD-10-CM | POA: Diagnosis not present

## 2023-01-06 ENCOUNTER — Ambulatory Visit (INDEPENDENT_AMBULATORY_CARE_PROVIDER_SITE_OTHER): Payer: Commercial Managed Care - PPO | Admitting: Licensed Clinical Social Worker

## 2023-01-06 DIAGNOSIS — F4010 Social phobia, unspecified: Secondary | ICD-10-CM

## 2023-01-06 DIAGNOSIS — F411 Generalized anxiety disorder: Secondary | ICD-10-CM
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
## 2023-01-19 ENCOUNTER — Ambulatory Visit (INDEPENDENT_AMBULATORY_CARE_PROVIDER_SITE_OTHER): Payer: Commercial Managed Care - PPO | Admitting: Licensed Clinical Social Worker

## 2023-01-19 DIAGNOSIS — F411 Generalized anxiety disorder: Secondary | ICD-10-CM

## 2023-01-19 NOTE — Progress Notes (Signed)
[facility] Counselor/Therapist Progress Note  Patient ID: [name] [name], MRN: [id number]    Date: 01/19/23  Time Spent: 0800  am - 0855 am : 55 Minutes  Treatment Type: Individual Therapy.  Reported Symptoms: Symptoms of Depression and anxiety  Mental Status Exam: Appearance:  Well Groomed     Behavior: Appropriate  Motor: Normal  Speech/Language:  Clear and Coherent  Affect: Appropriate  Mood: normal  Thought process: normal  Thought content:   WNL  Sensory/Perceptual disturbances:   WNL  Orientation: oriented to person, place, time/date, situation, day of week, month of year, and year  Attention: Good  Concentration: Good  Memory: WNL  Fund of knowledge:  Good  Insight:   Good  Judgment:  Good  Impulse Control: Good   Risk Assessment: Danger to Self:  No Self-injurious Behavior: No Danger to Others: No Duty to Warn:no Physical Aggression / Violence:No  Access to Firearms a concern: No  Gang Involvement:No   Subjective:   [name] [name] participated in the session, in person in the office with the therapist, and consented to treatment. [name] [name] presented on time for her scheduled session. She was in good spirits and reported that she feels more positive about her situation at this time. PT identified concerns related to her husbands health and how it will impact them if he does not remain sober and care for himself. PT was polite and cooperative and was insightful as to how she needs to move forward to remain mentally healthy as she assist her husband.     Interventions: Cognitive Behavioral Therapy  Diagnosis:  No diagnosis found.   Plan: [name] [name] is to use CBT, mindfulness and coping skills to help manage decrease symptoms associated with their diagnosis.   Long-term goal:   [name] [name] will  overall level, frequency, and intensity of the feelings of depression and anxiety evidenced by       decreased irritability, negative self talk, and helpless  feelings from 6 to 7 days/week to 0 to 2 days/week per client report for at least 3 consecutive months.  Short-term goal:  [name] [name] will verbally express understanding of the relationship between feelings of depression, anxiety and their impact on thinking patterns and behaviors. Verbalize an understanding of the role that distorted thinking plays in creating fears, excessive worry, and ruminations.  [name] [name] DATE: 01/19/2023

## 2023-01-28 ENCOUNTER — Ambulatory Visit (INDEPENDENT_AMBULATORY_CARE_PROVIDER_SITE_OTHER): Payer: Commercial Managed Care - PPO | Admitting: Licensed Clinical Social Worker

## 2023-01-28 DIAGNOSIS — F411 Generalized anxiety disorder: Secondary | ICD-10-CM | POA: Diagnosis not present
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
## 2023-02-03 ENCOUNTER — Encounter (INDEPENDENT_AMBULATORY_CARE_PROVIDER_SITE_OTHER): Payer: Self-pay

## 2023-02-11 ENCOUNTER — Ambulatory Visit: Payer: Commercial Managed Care - PPO | Admitting: Licensed Clinical Social Worker

## 2023-02-16 DIAGNOSIS — D1801 Hemangioma of skin and subcutaneous tissue: Secondary | ICD-10-CM | POA: Diagnosis not present

## 2023-02-16 DIAGNOSIS — L603 Nail dystrophy: Secondary | ICD-10-CM | POA: Diagnosis not present

## 2023-02-16 DIAGNOSIS — D225 Melanocytic nevi of trunk: Secondary | ICD-10-CM | POA: Diagnosis not present

## 2023-02-16 DIAGNOSIS — Z85828 Personal history of other malignant neoplasm of skin: Secondary | ICD-10-CM | POA: Diagnosis not present

## 2023-02-16 DIAGNOSIS — L738 Other specified follicular disorders: Secondary | ICD-10-CM | POA: Diagnosis not present

## 2023-02-16 DIAGNOSIS — D2272 Melanocytic nevi of left lower limb, including hip: Secondary | ICD-10-CM | POA: Diagnosis not present

## 2023-02-16 DIAGNOSIS — L821 Other seborrheic keratosis: Secondary | ICD-10-CM | POA: Diagnosis not present

## 2023-02-18 ENCOUNTER — Ambulatory Visit: Payer: Commercial Managed Care - PPO | Admitting: Internal Medicine

## 2023-02-18 ENCOUNTER — Encounter: Payer: Self-pay | Admitting: Internal Medicine

## 2023-02-18 VITALS — BP 122/80 | HR 69 | Temp 98.4°F | Ht 63.0 in | Wt 132.0 lb

## 2023-02-18 DIAGNOSIS — F5104 Psychophysiologic insomnia: Secondary | ICD-10-CM | POA: Diagnosis not present

## 2023-02-18 DIAGNOSIS — G4700 Insomnia, unspecified: Secondary | ICD-10-CM | POA: Insufficient documentation

## 2023-02-18 NOTE — Assessment & Plan Note (Signed)
Sleeping well overall just waking up a few times at night. Discussed melatonin and sleep relaxation exercises to do when awakening. She is not tired and I do not think sleep medication prescription is indicated. She agrees and will try.

## 2023-02-18 NOTE — Progress Notes (Signed)
   Subjective:   Patient ID: [name] [name], female    DOB: 11/09/1955, 67 y.o.   MRN: [id number]  HPI The patient is a 67 YO female coming in for sleep changes. Under more stress and waking up more at night. Typically falls back asleep. Denies fatigue or tiredness during day.   Review of Systems  Constitutional: Negative.   HENT: Negative.    Eyes: Negative.   Respiratory:  Negative for cough, chest tightness and shortness of breath.   Cardiovascular:  Negative for chest pain, palpitations and leg swelling.  Gastrointestinal:  Negative for abdominal distention, abdominal pain, constipation, diarrhea, nausea and vomiting.  Musculoskeletal: Negative.   Skin: Negative.   Neurological: Negative.   Psychiatric/Behavioral:  Positive for sleep disturbance.     Objective:  Physical Exam Constitutional:      Appearance: Normal appearance.  HENT:     Head: Normocephalic.  Musculoskeletal:        General: Normal range of motion.  Skin:    General: Skin is warm and dry.  Neurological:     General: No focal deficit present.     Mental Status: She is alert and oriented to person, place, and time.     Vitals:   02/18/23 0804  BP: 122/80  Pulse: 69  Temp: 98.4 F (36.9 C)  TempSrc: Oral  SpO2: 97%  Weight: 132 lb (59.9 kg)  Height: 5\' 3"  (1.6 m)    Assessment & Plan:  Visit time 15 minutes in face to face communication with patient and coordination of care, additional 5 minutes spent in record review, coordination or care, ordering tests, communicating/referring to other healthcare professionals, documenting in medical records all on the same day of the visit for total time 20 minutes spent on the visit.

## 2023-02-19 ENCOUNTER — Other Ambulatory Visit: Payer: Self-pay | Admitting: Gastroenterology

## 2023-02-19 ENCOUNTER — Other Ambulatory Visit (HOSPITAL_BASED_OUTPATIENT_CLINIC_OR_DEPARTMENT_OTHER): Payer: Self-pay

## 2023-02-19 ENCOUNTER — Other Ambulatory Visit: Payer: Self-pay

## 2023-02-19 MED ORDER — FUROSEMIDE 40 MG PO TABS
40.0000 mg | ORAL_TABLET | Freq: Every day | ORAL | 3 refills | Status: DC
  Filled 2023-02-19: qty 90, 90d supply, fill #0
  Filled 2023-05-20 (×2): qty 90, 90d supply, fill #1

## 2023-02-19 NOTE — Telephone Encounter (Signed)
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 Please advise on the refill Sir, thank you.

## 2023-02-20 ENCOUNTER — Other Ambulatory Visit (HOSPITAL_BASED_OUTPATIENT_CLINIC_OR_DEPARTMENT_OTHER): Payer: Self-pay

## 2023-02-24 DIAGNOSIS — H5203 Hypermetropia, bilateral: Secondary | ICD-10-CM | POA: Diagnosis not present

## 2023-02-25 ENCOUNTER — Ambulatory Visit: Payer: Commercial Managed Care - PPO | Admitting: Licensed Clinical Social Worker

## 2023-03-08 ENCOUNTER — Other Ambulatory Visit (HOSPITAL_BASED_OUTPATIENT_CLINIC_OR_DEPARTMENT_OTHER): Payer: Self-pay

## 2023-03-09 ENCOUNTER — Other Ambulatory Visit: Payer: Self-pay

## 2023-03-09 ENCOUNTER — Other Ambulatory Visit (HOSPITAL_BASED_OUTPATIENT_CLINIC_OR_DEPARTMENT_OTHER): Payer: Self-pay

## 2023-03-15 ENCOUNTER — Ambulatory Visit (INDEPENDENT_AMBULATORY_CARE_PROVIDER_SITE_OTHER): Payer: Commercial Managed Care - PPO | Admitting: Licensed Clinical Social Worker

## 2023-03-15 DIAGNOSIS — F411 Generalized anxiety disorder: Secondary | ICD-10-CM

## 2023-03-15 NOTE — Progress Notes (Signed)
[facility] Counselor/Therapist Progress Note  Patient ID: [name] [name], MRN: [id number]    Date: 03/15/23  Time Spent: 0900  am - 1000 am : 60 Minutes  Treatment Type: Individual Therapy.  Reported Symptoms: anxiety and symptoms of depression related to husbands health diagnosis.  Mental Status Exam: Appearance:  Well Groomed     Behavior: Appropriate  Motor: Normal  Speech/Language:  Clear and Coherent  Affect: Flat  Mood: normal  Thought process: normal  Thought content:   WNL  Sensory/Perceptual disturbances:   WNL  Orientation: oriented to person, place, time/date, situation, day of week, month of year, and year  Attention: Good  Concentration: Good  Memory: WNL  Fund of knowledge:  Good  Insight:   Good  Judgment:  Good  Impulse Control: Good   Risk Assessment: Danger to Self:  No Self-injurious Behavior: No Danger to Others: No Duty to Warn:no Physical Aggression / Violence:No  Access to Firearms a concern: No  Gang Involvement:No   Subjective:   [name] [name] participated from office located at the [facility] with Clinician present. [name] [name] consented to treatment.    [name] [name] presented for her session in a good mood. Clinician addressed her anxiety related to patients husband health. Patient reports that there have been highs and lows and  many setbacks. Patient identified navigating the healthcare system to advocate for her spouse has been stressful. 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 Patient identified that she runs and works out daily. Clinician identified how this assist patient in her mental, social and physical well being. Patient agreed that the exercise is a vital part of her existence and relieving her stress and anxiety.  Interventions: Cognitive Behavioral Therapy  Diagnosis: Generalized anxiety disorder   Plan: [name] [name] is to use CBT,  mindfulness and coping skills to help manage decrease symptoms associated with their diagnosis.   Long-term goal:   [name] [name] will reduce overall level, frequency, and intensity of the feelings of depression, anxiety and stress as  evidenced by decreased irritability, negative self talk, and helpless feelings from 6 to 7 days/week to 0 to 2 days/week per client report for at least 3 consecutive months. Treatment plan to be reviewed by 01/06/2024.  Short-term goal:  [name] [name] will verbally express understanding of the relationship between feelings of depression, anxiety and their impact on thinking patterns and behaviors. Verbalize an understanding of the role that distorted thinking plays in creating fears, excessive worry, and ruminations.  [name] [name] MSW, LCSW DATE:03/15/2023

## 2023-03-23 DIAGNOSIS — Z8551 Personal history of malignant neoplasm of bladder: Secondary | ICD-10-CM | POA: Diagnosis not present

## 2023-03-24 ENCOUNTER — Other Ambulatory Visit (HOSPITAL_BASED_OUTPATIENT_CLINIC_OR_DEPARTMENT_OTHER): Payer: Self-pay

## 2023-03-29 ENCOUNTER — Ambulatory Visit: Payer: Commercial Managed Care - PPO | Admitting: Licensed Clinical Social Worker

## 2023-04-12 ENCOUNTER — Ambulatory Visit (INDEPENDENT_AMBULATORY_CARE_PROVIDER_SITE_OTHER): Payer: Commercial Managed Care - PPO | Admitting: Licensed Clinical Social Worker

## 2023-04-12 DIAGNOSIS — F411 Generalized anxiety disorder: Secondary | ICD-10-CM | POA: Diagnosis not present

## 2023-04-12 NOTE — Progress Notes (Signed)
[facility] Counselor/Therapist Progress Note  Patient ID: [name] [name], MRN: [id number]    Date: 04/12/23  Time Spent: 0855  am - 0940 am : 45 Minutes  Treatment Type: Individual Therapy.  Reported Symptoms: Anxiety  Mental Status Exam: Appearance:  Well Groomed     Behavior: Appropriate  Motor: Normal  Speech/Language:  Clear and Coherent  Affect: Appropriate  Mood: normal  Thought process: normal  Thought content:   WNL  Sensory/Perceptual disturbances:   WNL  Orientation: oriented to person, place, time/date, situation, day of week, month of year, and year  Attention: Good  Concentration: Good  Memory: WNL  Fund of knowledge:  Good  Insight:   Good  Judgment:  Good  Impulse Control: Good   Risk Assessment: Danger to Self:  No Self-injurious Behavior: No Danger to Others: No Duty to Warn:no Physical Aggression / Violence:No  Access to Firearms a concern: No  Gang Involvement:No   Subjective:   [name] [name] participated from office located at [facility] with Clinician present. [name] [name] consented to treatment.   [name] [name] presented for her session and fully engaged in discussion. Patient shared the mental and physical health improvements of her spouse which has assisted in decreasing her anxiety. 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 Clinician and patient discussed the importance of self-care on any given day but especially when we are caring for others. Patient identified what she does for self care and identified that she previously felt guilty but has recognized the need for it in order to be what she needs to be at home and at work.   Interventions: Cognitive Behavioral Therapy  Diagnosis: Generalized Anxiety Disorder   Plan: [name] [name] is to use CBT, mindfulness and coping skills to help manage decrease symptoms associated with their diagnosis.   Long-term goal:   [name] [name] will reduce overall level,  frequency, and intensity of the feelings of  anxiety evidenced by decreased irritability, negative self talk, and helpless feelings from 6 to 7 days/week to 0 to 2 days/week per client report for at least 3 consecutive months. Treatment plan to be reviewed by 01/06/2024.  Short-term goal:  [name] [name] will verbally express understanding of the relationship between feelings of depression, anxiety and their impact on thinking patterns and behaviors. Verbalize an understanding of the role that distorted thinking plays in creating fears, excessive worry, and ruminations.  [name] [name] MSW, LCSW DATE:04/12/2023

## 2023-04-21 ENCOUNTER — Other Ambulatory Visit: Payer: Self-pay

## 2023-04-21 ENCOUNTER — Telehealth: Payer: Self-pay

## 2023-04-21 DIAGNOSIS — K7030 Alcoholic cirrhosis of liver without ascites: Secondary | ICD-10-CM

## 2023-04-21 NOTE — Telephone Encounter (Signed)
Reminder was received in Epic.  Pt made aware. Pt was scheduled for [place] on 04/26/2023 at 8:30 AM. Arrive at 8:15 AM at [facility].  Nothing to eat or drink past midnight.  Pt made aware. Pt stated that she would like it to be done at [place] [facility] and that she would call and reschedule.  Pt verbalized understanding with all questions answered.

## 2023-04-21 NOTE — Telephone Encounter (Signed)
-----   Message from Nurse Patty P sent at 10/20/2022  1:24 PM EDT ----- reminder for an ultrasound in 6 months you can put that in my name

## 2023-04-26 ENCOUNTER — Ambulatory Visit (HOSPITAL_BASED_OUTPATIENT_CLINIC_OR_DEPARTMENT_OTHER)
Admission: RE | Admit: 2023-04-26 | Discharge: 2023-04-26 | Disposition: A | Discharge status: Home / Self Care | Payer: Commercial Managed Care - PPO | Source: Ambulatory Visit | Attending: Internal Medicine | Admitting: Internal Medicine

## 2023-04-26 ENCOUNTER — Ambulatory Visit (HOSPITAL_BASED_OUTPATIENT_CLINIC_OR_DEPARTMENT_OTHER): Payer: Commercial Managed Care - PPO

## 2023-04-26 ENCOUNTER — Other Ambulatory Visit (HOSPITAL_COMMUNITY): Payer: Commercial Managed Care - PPO

## 2023-04-26 DIAGNOSIS — K7030 Alcoholic cirrhosis of liver without ascites: Secondary | ICD-10-CM | POA: Insufficient documentation

## 2023-04-26 DIAGNOSIS — K7689 Other specified diseases of liver: Secondary | ICD-10-CM | POA: Diagnosis not present

## 2023-04-26 DIAGNOSIS — K7460 Unspecified cirrhosis of liver: Secondary | ICD-10-CM | POA: Diagnosis not present

## 2023-04-27 ENCOUNTER — Ambulatory Visit (INDEPENDENT_AMBULATORY_CARE_PROVIDER_SITE_OTHER): Payer: Commercial Managed Care - PPO | Admitting: Licensed Clinical Social Worker

## 2023-04-27 DIAGNOSIS — F411 Generalized anxiety disorder: Secondary | ICD-10-CM | POA: Diagnosis not present

## 2023-04-27 NOTE — Progress Notes (Signed)
[facility] Counselor/Therapist Progress Note  Patient ID: [name] [name], MRN: [id number]    Date: 04/27/23  Time Spent: 801  am - 0850 am : 49 Minutes  Treatment Type: Individual Therapy.  Reported Symptoms: Anxiety  Mental Status Exam: Appearance:  Neat     Behavior: Appropriate  Motor: Normal  Speech/Language:  Clear and Coherent  Affect: Appropriate  Mood: normal  Thought process: normal  Thought content:   WNL  Sensory/Perceptual disturbances:   WNL  Orientation: oriented to person, place, time/date, situation, day of week, month of year, and year  Attention: Good  Concentration: Good  Memory: WNL  Fund of knowledge:  Good  Insight:   Good  Judgment:  Good  Impulse Control: Good   Risk Assessment: Danger to Self:  No Self-injurious Behavior: No Danger to Others: No Duty to Warn:no Physical Aggression / Violence:No  Access to Firearms a concern: No  Gang Involvement:No   Subjective:   [name] participated from office, located at [facility] with Clinician present. Mailen consented to treatment.   Mahalia presented in a positive mood for her session. Patient revealed that she is leaving for vacation with her spouse later this morning. She stated they are going to [place] and she is looking forward to this time with her spouse. Patient shared her feelings about how far her husband has came but reports that she remains anxious due to not trusting him and being fearful that he will go back to drinking. Clinician and patient processed that it will take time to rebuild that trust. Clinician encouraged patient to enjoy each moment for what it is and not allow the fear and anxiety steal her joy.   Interventions: Cognitive Behavioral Therapy  Diagnosis: Generalized anxiety disorder   Plan: Normani  is to use CBT, mindfulness and coping skills to help manage decrease symptoms associated with their diagnosis.   Long-term goal:   Melea will  reduce overall level, frequency, and intensity of the feelings of anxiety and fear/worry evidenced by decreased irritability, negative self talk, and helpless feelings from 6 to 7 days/week to 0 to 2 days/week per client report for at least 3 consecutive months. Treatment plan to be reviewed by 01/06/2024.  Short-term goal:  Macee will verbally express understanding of the relationship between feelings of anxiety and their impact on thinking patterns and behaviors. Verbalize an understanding of the role that distorted thinking plays in creating fears, excessive worry, and ruminations.  [name] MSW, LCSW DATE: 04/27/2023

## 2023-04-29 ENCOUNTER — Other Ambulatory Visit: Payer: Self-pay

## 2023-04-29 DIAGNOSIS — K7030 Alcoholic cirrhosis of liver without ascites: Secondary | ICD-10-CM

## 2023-04-30 ENCOUNTER — Other Ambulatory Visit (INDEPENDENT_AMBULATORY_CARE_PROVIDER_SITE_OTHER): Payer: Commercial Managed Care - PPO

## 2023-04-30 DIAGNOSIS — K7030 Alcoholic cirrhosis of liver without ascites: Secondary | ICD-10-CM

## 2023-04-30 LAB — COMPREHENSIVE METABOLIC PANEL
ALT: 18 U/L (ref 0–35)
AST: 23 U/L (ref 0–37)
Albumin: 4.8 g/dL (ref 3.5–5.2)
Alkaline Phosphatase: 122 U/L — ABNORMAL HIGH (ref 39–117)
BUN: 11 mg/dL (ref 6–23)
CO2: 31 meq/L (ref 19–32)
Calcium: 10 mg/dL (ref 8.4–10.5)
Chloride: 96 meq/L (ref 96–112)
Creatinine, Ser: 0.79 mg/dL (ref 0.40–1.20)
GFR: 76.86 mL/min (ref >60.00)
Glucose, Bld: 81 mg/dL (ref 70–99)
Potassium: 3.6 meq/L (ref 3.5–5.1)
Sodium: 139 meq/L (ref 135–145)
Total Bilirubin: 1.1 mg/dL (ref 0.2–1.2)
Total Protein: 7.8 g/dL (ref 6.0–8.3)

## 2023-04-30 LAB — CBC
HCT: 47.3 % — ABNORMAL HIGH (ref 36.0–46.0)
Hemoglobin: 15.4 g/dL — ABNORMAL HIGH (ref 12.0–15.0)
MCHC: 32.6 g/dL (ref 30.0–36.0)
MCV: 95.8 fL (ref 78.0–100.0)
Platelets: 286 10*3/uL (ref 150.0–400.0)
RBC: 4.94 Mil/uL (ref 3.87–5.11)
RDW: 14 % (ref 11.5–15.5)
WBC: 8.8 10*3/uL (ref 4.0–10.5)

## 2023-04-30 LAB — PROTIME-INR
INR: 1.2 {ratio} — ABNORMAL HIGH (ref 0.8–1.0)
Prothrombin Time: 13 s (ref 9.6–13.1)

## 2023-05-03 LAB — AFP TUMOR MARKER: AFP-Tumor Marker: 4.1 ng/mL

## 2023-05-10 ENCOUNTER — Ambulatory Visit (INDEPENDENT_AMBULATORY_CARE_PROVIDER_SITE_OTHER): Payer: Commercial Managed Care - PPO | Admitting: Licensed Clinical Social Worker

## 2023-05-10 DIAGNOSIS — F411 Generalized anxiety disorder: Secondary | ICD-10-CM | POA: Diagnosis not present

## 2023-05-10 NOTE — Progress Notes (Signed)
[facility] Counselor/Therapist Progress Note  Patient ID: ASJA FROMMER, MRN: [id number]    Date: 05/10/23  Time Spent: 0801  am - 0851 am : 50 Minutes  Treatment Type: Individual Therapy.  Reported Symptoms: Anxiety, stress related to spouses illness and alcoholisim  Mental Status Exam: Appearance:  Well Groomed     Behavior: Appropriate  Motor: Normal  Speech/Language:  Clear and Coherent  Affect: Appropriate  Mood: normal  Thought process: normal  Thought content:   WNL  Sensory/Perceptual disturbances:   WNL  Orientation: oriented to person, place, time/date, situation, day of week, month of year, and year  Attention: Good  Concentration: Good  Memory: WNL  Fund of knowledge:  Good  Insight:   Good  Judgment:  Good  Impulse Control: Good   Risk Assessment: Danger to Self:  No Self-injurious Behavior: No Danger to Others: No Duty to Warn:no Physical Aggression / Violence:No  Access to Firearms a concern:  NO Gang Involvement:No   Subjective:   [name] [name] participated from office, located at [facility] with Clinician present. Adelae consented to treatment.    [name] [name] presented on time for her session. Patient was well groomed and eager to share about her recent vacation with her husband. Patient was excited to see the progress that her spouse has made. Patient shared that although things are going well she remains concerned that things will go back to how they were. Clinician and patient processed all the steps taken and progress made up to this point. Clinician encouraged patient to live for each step forward and celebrate that. Clinician also discussed with patient the positive of her spouse wanting to atend therapy, be busy around the house and go to church as moves in the right direction and to keep in moving in a positive direction.   Interventions: Cognitive Behavioral Therapy  Diagnosis: Generalized Anxiety Disorder   Plan:  [name] [name] is to use CBT, mindfulness and coping skills to help manage decrease symptoms associated with their diagnosis.   Long-term goal:   [name] [name] will reduce overall level, frequency, and intensity of the feelings of anxiety evidenced by decreased irritability, negative self talk, and helpless feelings from 6 to 7 days/week to 0 to 2 days/week per client report for at least 3 consecutive months. Treatment plan to be reviewed by 01/06/2024.  Short-term goal:  [name] [name] will verbally express understanding of the relationship between feelings of anxiety and their impact on thinking patterns and behaviors. Verbalize an understanding of the role that distorted thinking plays in creating fears, excessive worry, and ruminations.  [name] MSW, LCSW DATE: 05/10/2023

## 2023-05-20 ENCOUNTER — Other Ambulatory Visit (HOSPITAL_BASED_OUTPATIENT_CLINIC_OR_DEPARTMENT_OTHER): Payer: Self-pay

## 2023-05-20 ENCOUNTER — Other Ambulatory Visit: Payer: Self-pay

## 2023-05-24 ENCOUNTER — Ambulatory Visit (INDEPENDENT_AMBULATORY_CARE_PROVIDER_SITE_OTHER): Payer: Commercial Managed Care - PPO | Admitting: Licensed Clinical Social Worker

## 2023-05-24 DIAGNOSIS — F411 Generalized anxiety disorder: Secondary | ICD-10-CM
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
## 2023-06-08 DIAGNOSIS — H9122 Sudden idiopathic hearing loss, left ear: Secondary | ICD-10-CM | POA: Diagnosis not present

## 2023-06-08 DIAGNOSIS — H903 Sensorineural hearing loss, bilateral: Secondary | ICD-10-CM | POA: Diagnosis not present

## 2023-06-15 ENCOUNTER — Ambulatory Visit: Payer: Commercial Managed Care - PPO | Admitting: Licensed Clinical Social Worker

## 2023-06-28 ENCOUNTER — Other Ambulatory Visit (HOSPITAL_BASED_OUTPATIENT_CLINIC_OR_DEPARTMENT_OTHER): Payer: Self-pay

## 2023-06-28 ENCOUNTER — Other Ambulatory Visit: Payer: Self-pay | Admitting: Internal Medicine

## 2023-06-28 MED ORDER — FLUTICASONE PROPIONATE 50 MCG/ACT NA SUSP
2.0000 | Freq: Every day | NASAL | 3 refills | Status: DC
  Filled 2023-06-28: qty 48, 90d supply, fill #0

## 2023-07-06 ENCOUNTER — Ambulatory Visit: Payer: Commercial Managed Care - PPO | Admitting: Licensed Clinical Social Worker

## 2023-07-08 ENCOUNTER — Encounter (INDEPENDENT_AMBULATORY_CARE_PROVIDER_SITE_OTHER): Payer: Commercial Managed Care - PPO | Admitting: Ophthalmology

## 2023-07-08 DIAGNOSIS — H35373 Puckering of macula, bilateral: Secondary | ICD-10-CM | POA: Diagnosis not present

## 2023-07-08 DIAGNOSIS — H43813 Vitreous degeneration, bilateral: Secondary | ICD-10-CM | POA: Diagnosis not present

## 2023-07-08 DIAGNOSIS — I10 Essential (primary) hypertension: Secondary | ICD-10-CM | POA: Diagnosis not present

## 2023-07-08 DIAGNOSIS — H3553 Other dystrophies primarily involving the sensory retina: Secondary | ICD-10-CM

## 2023-07-08 DIAGNOSIS — H35033 Hypertensive retinopathy, bilateral: Secondary | ICD-10-CM

## 2023-07-12 ENCOUNTER — Ambulatory Visit: Payer: Commercial Managed Care - PPO | Admitting: Licensed Clinical Social Worker

## 2023-07-12 DIAGNOSIS — F411 Generalized anxiety disorder: Secondary | ICD-10-CM | POA: Diagnosis not present
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
## 2023-07-15 ENCOUNTER — Other Ambulatory Visit: Payer: Self-pay | Admitting: Internal Medicine

## 2023-07-15 DIAGNOSIS — Z1231 Encounter for screening mammogram for malignant neoplasm of breast: Secondary | ICD-10-CM

## 2023-07-26 ENCOUNTER — Ambulatory Visit: Payer: Commercial Managed Care - PPO | Admitting: Licensed Clinical Social Worker

## 2023-07-26 DIAGNOSIS — F411 Generalized anxiety disorder: Secondary | ICD-10-CM

## 2023-07-26 NOTE — Progress Notes (Signed)
[facility] Counselor/Therapist Progress Note  Patient ID: KELLYN KUTSCH, MRN: [id number]    Date: 07/26/23  Time Spent: 0800  am - 0900 am : 60 Minutes  Treatment Type: Individual Therapy.  Reported Symptoms: Anxiety, stress, excessive worry  Mental Status Exam: Appearance:  Well Groomed     Behavior: Appropriate  Motor: Normal  Speech/Language:  Clear and Coherent  Affect: Appropriate  Mood: normal  Thought process: normal  Thought content:   WNL  Sensory/Perceptual disturbances:   WNL  Orientation: oriented to person, place, time/date, situation, day of week, month of year, and year  Attention: Good  Concentration: Good  Memory: WNL  Fund of knowledge:  Good  Insight:   Good  Judgment:  Good  Impulse Control: Good   Risk Assessment: Danger to Self:  No Self-injurious Behavior: No Danger to Others: No Duty to Warn:no Physical Aggression / Violence:No  Access to Firearms a concern: No  Gang Involvement:No   Subjective:   [name] participated from office, located at [facility] with Clinician present. Jadesola  consented to treatment.   Takeyla presented for her session with an improve mood from her last session. Patient was previously tearful and concerned about her husband and the future of his health and care.  Patient reports that her husband is now in a skilled facility for rehab. She is concerned that he only is allotted so many days and he has been unable to participate full in rehab due to the severity of his health.Patient was insightful and recognized that taking things one day at a time is essential in her remaining both physically and mentally well. Patient reports that she is trying to remain busy and keep a routine.   Clinician observed an improvement in patients outlook and coping ability. Clinician engaged in discussion with patient in regard to her self-care and the importance of that as a caregiver. Clinician processed with  patient that her insight is amazing and that her being aware of her own limitations is essential in decreasing her anxiety symptoms  Open communication: Talk openly with your partner about your concerns and fears, and encourage them to do the same.  Seek professional support: Consider individual or couples therapy to learn coping mechanisms and manage anxiety.  Educate yourself: Learn about your spouse's medical condition to better understand the situation and potential outcomes.  Self-care practices: Prioritize healthy habits like regular exercise, good sleep, and relaxation techniques to manage stress.  Build a support system: Lean on friends, family, or support groups for emotional support.    Patient will continue with outpatient therapy biweekly and will utilize her support network. Patient will practice self-care and remain on a routine. Treatment plan will be reviewed by 01/06/2024.  Interventions: Cognitive Behavioral Therapy  Diagnosis: Generalized anxiety disorder   [name] MSW, LCSW/DATE 07/26/2023

## 2023-08-02 ENCOUNTER — Ambulatory Visit (INDEPENDENT_AMBULATORY_CARE_PROVIDER_SITE_OTHER): Payer: Commercial Managed Care - PPO | Admitting: Internal Medicine

## 2023-08-02 ENCOUNTER — Other Ambulatory Visit (HOSPITAL_BASED_OUTPATIENT_CLINIC_OR_DEPARTMENT_OTHER): Payer: Self-pay

## 2023-08-02 ENCOUNTER — Encounter: Payer: Self-pay | Admitting: Internal Medicine

## 2023-08-02 VITALS — BP 128/92 | HR 75 | Temp 98.3°F | Ht 63.0 in | Wt 137.0 lb

## 2023-08-02 DIAGNOSIS — K219 Gastro-esophageal reflux disease without esophagitis: Secondary | ICD-10-CM

## 2023-08-02 DIAGNOSIS — Z13220 Encounter for screening for lipoid disorders: Secondary | ICD-10-CM | POA: Diagnosis not present

## 2023-08-02 DIAGNOSIS — J4520 Mild intermittent asthma, uncomplicated: Secondary | ICD-10-CM

## 2023-08-02 DIAGNOSIS — K7030 Alcoholic cirrhosis of liver without ascites: Secondary | ICD-10-CM

## 2023-08-02 DIAGNOSIS — Z0000 Encounter for general adult medical examination without abnormal findings: Secondary | ICD-10-CM

## 2023-08-02 DIAGNOSIS — I10 Essential (primary) hypertension: Secondary | ICD-10-CM

## 2023-08-02 MED ORDER — FLUTICASONE PROPIONATE 50 MCG/ACT NA SUSP
2.0000 | Freq: Every day | NASAL | 3 refills | Status: AC
  Filled 2023-08-02 – 2023-09-10 (×3): qty 48, 90d supply, fill #0
  Filled 2023-12-21: qty 48, 90d supply, fill #1
  Filled 2024-03-18: qty 48, 90d supply, fill #2
  Filled 2024-06-14: qty 48, 90d supply, fill #3

## 2023-08-02 MED ORDER — ALBUTEROL SULFATE HFA 108 (90 BASE) MCG/ACT IN AERS
2.0000 | INHALATION_SPRAY | RESPIRATORY_TRACT | 5 refills | Status: AC | PRN
  Filled 2023-08-02: qty 6.7, 17d supply, fill #0

## 2023-08-02 NOTE — Progress Notes (Signed)
   Subjective:   Patient ID: [name] [name], female    DOB: 1956-05-10, 68 y.o.   MRN: [id number]  HPI The patient is here for physical.  PMH, [facility], social history reviewed and updated  Review of Systems  Constitutional: Negative.   HENT: Negative.    Eyes: Negative.   Respiratory:  Negative for cough, chest tightness and shortness of breath.   Cardiovascular:  Negative for chest pain, palpitations and leg swelling.  Gastrointestinal:  Negative for abdominal distention, abdominal pain, constipation, diarrhea, nausea and vomiting.  Musculoskeletal: Negative.   Skin: Negative.   Neurological: Negative.   Psychiatric/Behavioral: Negative.      Objective:  Physical Exam Constitutional:      Appearance: She is well-developed.  HENT:     Head: Normocephalic and atraumatic.  Cardiovascular:     Rate and Rhythm: Normal rate and regular rhythm.  Pulmonary:     Effort: Pulmonary effort is normal. No respiratory distress.     Breath sounds: Normal breath sounds. No wheezing or rales.  Abdominal:     General: Bowel sounds are normal. There is no distension.     Palpations: Abdomen is soft.     Tenderness: There is no abdominal tenderness. There is no rebound.  Musculoskeletal:     Cervical back: Normal range of motion.  Skin:    General: Skin is warm and dry.  Neurological:     Mental Status: She is alert and oriented to person, place, and time.     Coordination: Coordination normal.     Vitals:   08/02/23 0759 08/02/23 0805  BP: (!) 128/92 (!) 128/92  Pulse: 75   Temp: 98.3 F (36.8 C)   TempSrc: Oral   SpO2: 98%   Weight: 137 lb (62.1 kg)   Height: 5\' 3"  (1.6 m)     Assessment & Plan:

## 2023-08-02 NOTE — Assessment & Plan Note (Addendum)
No flare today up to date on AFP and ultrasound and labs. Seeing GI in the spring. Still avoids alcohol. Has not had swelling in long time and would like to de-escalate if possible. Advised to hold lasix about 2-4 weeks prior to labs with GI in spring and if stable they can decide to permanently stop. If recurrence of fluid she will resume.

## 2023-08-02 NOTE — Assessment & Plan Note (Signed)
BP close to goal and had not taken diuretics today. Continue spironolactone 50 mg daily and amlodipine 5 mg daily. Ok to try off lasix 40 mg daily close to next GI apt and see how BP and fluid status are doing. Labs up to date.

## 2023-08-02 NOTE — Assessment & Plan Note (Signed)
No flare today and mild intermittent. Refill albuterol.

## 2023-08-02 NOTE — Assessment & Plan Note (Signed)
Taking omeprazole 20 mg daily and can continue.

## 2023-08-02 NOTE — Patient Instructions (Addendum)
We will check the cholesterol at next GI lab draw and consider stopping lasix about 2-4 weeks before next GI visit to see if this is okay.

## 2023-08-02 NOTE — Assessment & Plan Note (Signed)
Flu shot up to date. Pneumonia counseled. Shingrix complete. Tetanus counseled. Colonoscopy up to date. Mammogram up to date, pap smear aged out and dexa complete. Counseled about sun safety and mole surveillance. Counseled about the dangers of distracted driving. Given 10 year screening recommendations.

## 2023-08-05 ENCOUNTER — Other Ambulatory Visit (HOSPITAL_BASED_OUTPATIENT_CLINIC_OR_DEPARTMENT_OTHER): Payer: Self-pay

## 2023-08-05 ENCOUNTER — Encounter (HOSPITAL_BASED_OUTPATIENT_CLINIC_OR_DEPARTMENT_OTHER): Payer: Self-pay

## 2023-08-09 ENCOUNTER — Ambulatory Visit
Admission: RE | Admit: 2023-08-09 | Discharge: 2023-08-09 | Disposition: A | Discharge status: Home / Self Care | Payer: Commercial Managed Care - PPO | Source: Ambulatory Visit | Attending: Internal Medicine | Admitting: Internal Medicine

## 2023-08-09 DIAGNOSIS — Z1231 Encounter for screening mammogram for malignant neoplasm of breast: Secondary | ICD-10-CM | POA: Diagnosis not present

## 2023-08-10 ENCOUNTER — Ambulatory Visit: Payer: Commercial Managed Care - PPO | Admitting: Licensed Clinical Social Worker

## 2023-08-10 DIAGNOSIS — F411 Generalized anxiety disorder: Secondary | ICD-10-CM

## 2023-08-10 NOTE — Progress Notes (Signed)
 [facility] Counselor/Therapist Progress Note  Patient ID: [name] [name], MRN: [id number]    Date: 08/10/23  Time Spent: 0804  am - 0905 pm : 61 Minutes  INDIVIDUAL THERAPY  Reported Symptoms: Anxiety, stress, excessive worry   Mental Status Exam: Appearance:  Well Groomed     Behavior: Appropriate  Motor: Normal  Speech/Language:  Clear and Coherent  Affect: Appropriate  Mood: normal  Thought process: normal  Thought content:   WNL  Sensory/Perceptual disturbances:   WNL  Orientation: oriented to person, place, time/date, situation, day of week, month of year, and year  Attention: Good  Concentration: Good  Memory: WNL  Fund of knowledge:  Good  Insight:   Good  Judgment:  Good  Impulse Control: Good    Risk Assessment: Danger to Self:  No Self-injurious Behavior: No Danger to Others: No Duty to Warn:no Physical Aggression / Violence:No  Access to Firearms a concern: No  Gang Involvement:No    Subjective:    [name] participated from office, located at [facility] with Clinician present. Sukari  consented to treatment.    Patient presented for her session tearful and overwhelmed. Cresencia reports that her husband has been in the facility for rehab for nearly a month and has shown no signs of improvement. She stats that all in all she feels he was more alert and himself in the hospital. Patient reports that she has an appointment with the facility team members today at noon to discuss the next steps. Patient reports that she is overwhelmed by information and trying to maneuver the system that she she is not familiar with. Patient states that she is currently paying out of pocket for her husbands care and is concerned that he get the best care possible during his stay.   Clinician actively listened and validated the client's experiences. Clinician provided a safe supportive  environment: Clinician wanted to assure that patient felt heard,  understood, and accepted. Patient was very tearful during this session  and patient showed a increase in her symptoms of anxiety and depression. Patient reports that has been focusing on getting things done around her home in an effort to feel that she is control of something. Clinician validated these feelings and urged patient to do things that make her feel better and help her cope with her emotions.   Patient agreed to practice active listening, encourage open communication, engage in relaxation techniques like deep breathing or meditation, seek professional help to assist with decision making for her spouse when necessary, educate herself about his condition, set boundaries for worry time, maintain a healthy lifestyle, and prioritize quality time with spouse while respecting their needs and feelings. Patient will continue with bi weekly therapy. Treatment plan to be reviewed by 01/06/2024.    Interventions: Cognitive Behavioral Therapy  Diagnosis: Generalized Anxiety Disorder    Damien Junk MSW, LCSW/DATE 08/10/2023

## 2023-08-11 ENCOUNTER — Encounter: Payer: Self-pay | Admitting: Internal Medicine

## 2023-08-11 LAB — HM MAMMOGRAPHY

## 2023-08-13 ENCOUNTER — Other Ambulatory Visit: Payer: Self-pay

## 2023-08-13 ENCOUNTER — Encounter (HOSPITAL_BASED_OUTPATIENT_CLINIC_OR_DEPARTMENT_OTHER): Payer: Self-pay

## 2023-08-13 DIAGNOSIS — K819 Cholecystitis, unspecified: Secondary | ICD-10-CM | POA: Diagnosis not present

## 2023-08-13 DIAGNOSIS — Z79899 Other long term (current) drug therapy: Secondary | ICD-10-CM | POA: Diagnosis not present

## 2023-08-13 DIAGNOSIS — J45909 Unspecified asthma, uncomplicated: Secondary | ICD-10-CM | POA: Diagnosis not present

## 2023-08-13 DIAGNOSIS — Z8551 Personal history of malignant neoplasm of bladder: Secondary | ICD-10-CM | POA: Diagnosis not present

## 2023-08-13 DIAGNOSIS — K8000 Calculus of gallbladder with acute cholecystitis without obstruction: Principal | ICD-10-CM | POA: Insufficient documentation

## 2023-08-13 DIAGNOSIS — J9811 Atelectasis: Secondary | ICD-10-CM | POA: Diagnosis not present

## 2023-08-13 DIAGNOSIS — K7030 Alcoholic cirrhosis of liver without ascites: Secondary | ICD-10-CM | POA: Diagnosis not present

## 2023-08-13 DIAGNOSIS — R109 Unspecified abdominal pain: Secondary | ICD-10-CM | POA: Diagnosis present

## 2023-08-13 DIAGNOSIS — I10 Essential (primary) hypertension: Secondary | ICD-10-CM | POA: Diagnosis not present

## 2023-08-13 NOTE — ED Triage Notes (Signed)
 Pt c/o right side pain that started around 19:30 today. Pt denies N/V/D.

## 2023-08-14 ENCOUNTER — Inpatient Hospital Stay (HOSPITAL_COMMUNITY): Payer: Commercial Managed Care - PPO

## 2023-08-14 ENCOUNTER — Encounter (HOSPITAL_COMMUNITY)
Admission: EM | Disposition: A | Discharge status: Home / Self Care | Payer: Self-pay | Source: Home / Self Care | Attending: Emergency Medicine

## 2023-08-14 ENCOUNTER — Emergency Department (HOSPITAL_BASED_OUTPATIENT_CLINIC_OR_DEPARTMENT_OTHER): Payer: Commercial Managed Care - PPO

## 2023-08-14 ENCOUNTER — Observation Stay (HOSPITAL_BASED_OUTPATIENT_CLINIC_OR_DEPARTMENT_OTHER)
Admission: EM | Admit: 2023-08-14 | Discharge: 2023-08-16 | Disposition: A | Discharge status: Home / Self Care | Payer: Commercial Managed Care - PPO | Attending: Internal Medicine | Admitting: Internal Medicine

## 2023-08-14 DIAGNOSIS — K7460 Unspecified cirrhosis of liver: Secondary | ICD-10-CM

## 2023-08-14 DIAGNOSIS — K7469 Other cirrhosis of liver: Secondary | ICD-10-CM | POA: Diagnosis not present

## 2023-08-14 DIAGNOSIS — Z01810 Encounter for preprocedural cardiovascular examination: Secondary | ICD-10-CM | POA: Diagnosis not present

## 2023-08-14 DIAGNOSIS — R109 Unspecified abdominal pain: Secondary | ICD-10-CM | POA: Diagnosis not present

## 2023-08-14 DIAGNOSIS — K819 Cholecystitis, unspecified: Principal | ICD-10-CM

## 2023-08-14 DIAGNOSIS — R1011 Right upper quadrant pain: Secondary | ICD-10-CM | POA: Diagnosis not present

## 2023-08-14 DIAGNOSIS — K810 Acute cholecystitis: Secondary | ICD-10-CM | POA: Diagnosis not present

## 2023-08-14 DIAGNOSIS — K828 Other specified diseases of gallbladder: Secondary | ICD-10-CM | POA: Diagnosis not present

## 2023-08-14 DIAGNOSIS — K7689 Other specified diseases of liver: Secondary | ICD-10-CM | POA: Diagnosis not present

## 2023-08-14 DIAGNOSIS — J9811 Atelectasis: Secondary | ICD-10-CM

## 2023-08-14 LAB — HEPATIC FUNCTION PANEL
ALT: 16 U/L (ref 0–44)
AST: 28 U/L (ref 15–41)
Albumin: 3.4 g/dL — ABNORMAL LOW (ref 3.5–5.0)
Alkaline Phosphatase: 114 U/L (ref 38–126)
Bilirubin, Direct: 0.5 mg/dL — ABNORMAL HIGH (ref 0.0–0.2)
Indirect Bilirubin: 0.9 mg/dL (ref 0.3–0.9)
Total Bilirubin: 1.4 mg/dL — ABNORMAL HIGH (ref 0.0–1.2)
Total Protein: 6.5 g/dL (ref 6.5–8.1)

## 2023-08-14 LAB — URINALYSIS, ROUTINE W REFLEX MICROSCOPIC
Bilirubin Urine: NEGATIVE
Glucose, UA: NEGATIVE mg/dL
Ketones, ur: NEGATIVE mg/dL
Leukocytes,Ua: NEGATIVE
Nitrite: NEGATIVE
Protein, ur: NEGATIVE mg/dL
Specific Gravity, Urine: 1.01 (ref 1.005–1.030)
pH: 8 (ref 5.0–8.0)

## 2023-08-14 LAB — COMPREHENSIVE METABOLIC PANEL
ALT: 16 U/L (ref 0–44)
AST: 18 U/L (ref 15–41)
Albumin: 4.3 g/dL (ref 3.5–5.0)
Alkaline Phosphatase: 125 U/L (ref 38–126)
Anion gap: 11 (ref 5–15)
BUN: 10 mg/dL (ref 8–23)
CO2: 29 mmol/L (ref 22–32)
Calcium: 9.6 mg/dL (ref 8.9–10.3)
Chloride: 94 mmol/L — ABNORMAL LOW (ref 98–111)
Creatinine, Ser: 0.67 mg/dL (ref 0.44–1.00)
GFR, Estimated: >60 mL/min (ref >60)
Glucose, Bld: 148 mg/dL — ABNORMAL HIGH (ref 70–99)
Potassium: 3.2 mmol/L — ABNORMAL LOW (ref 3.5–5.1)
Sodium: 134 mmol/L — ABNORMAL LOW (ref 135–145)
Total Bilirubin: 1.3 mg/dL — ABNORMAL HIGH (ref 0.0–1.2)
Total Protein: 7.6 g/dL (ref 6.5–8.1)

## 2023-08-14 LAB — CBC
HCT: 38.6 % (ref 36.0–46.0)
HCT: 41.1 % (ref 36.0–46.0)
Hemoglobin: 13.5 g/dL (ref 12.0–15.0)
Hemoglobin: 14.3 g/dL (ref 12.0–15.0)
MCH: 32.2 pg (ref 26.0–34.0)
MCH: 32.7 pg (ref 26.0–34.0)
MCHC: 34.8 g/dL (ref 30.0–36.0)
MCHC: 35 g/dL (ref 30.0–36.0)
MCV: 92.6 fL (ref 80.0–100.0)
MCV: 93.5 fL (ref 80.0–100.0)
Platelets: 350 10*3/uL (ref 150–400)
Platelets: 376 10*3/uL (ref 150–400)
RBC: 4.13 MIL/uL (ref 3.87–5.11)
RBC: 4.44 MIL/uL (ref 3.87–5.11)
RDW: 12.7 % (ref 11.5–15.5)
RDW: 13 % (ref 11.5–15.5)
WBC: 10.3 10*3/uL (ref 4.0–10.5)
WBC: 12.5 10*3/uL — ABNORMAL HIGH (ref 4.0–10.5)
nRBC: 0 % (ref 0.0–0.2)
nRBC: 0 % (ref 0.0–0.2)

## 2023-08-14 LAB — LIPASE, BLOOD: Lipase: 52 U/L — ABNORMAL HIGH (ref 11–51)

## 2023-08-14 LAB — TROPONIN I (HIGH SENSITIVITY): Troponin I (High Sensitivity): 7 ng/L (ref <18)

## 2023-08-14 LAB — D-DIMER, QUANTITATIVE: D-Dimer, Quant: 3.78 ug{FEU}/mL — ABNORMAL HIGH (ref 0.00–0.50)

## 2023-08-14 LAB — CREATININE, SERUM
Creatinine, Ser: 0.77 mg/dL (ref 0.44–1.00)
GFR, Estimated: >60 mL/min (ref >60)

## 2023-08-14 LAB — HIV ANTIBODY (ROUTINE TESTING W REFLEX): HIV Screen 4th Generation wRfx: NONREACTIVE

## 2023-08-14 SURGERY — LAPAROSCOPIC CHOLECYSTECTOMY
Anesthesia: Choice

## 2023-08-14 MED ORDER — DOCUSATE SODIUM 100 MG PO CAPS
100.0000 mg | ORAL_CAPSULE | Freq: Two times a day (BID) | ORAL | Status: DC
  Administered 2023-08-14 – 2023-08-16 (×5): 100 mg via ORAL
  Filled 2023-08-14 (×5): qty 1

## 2023-08-14 MED ORDER — ACETAMINOPHEN 325 MG PO TABS: 650.0000 mg | ORAL_TABLET | Freq: Four times a day (QID) | ORAL | Status: DC | PRN

## 2023-08-14 MED ORDER — POLYETHYLENE GLYCOL 3350 17 G PO PACK
17.0000 g | PACK | Freq: Two times a day (BID) | ORAL | Status: DC
  Administered 2023-08-14 – 2023-08-16 (×5): 17 g via ORAL
  Filled 2023-08-14 (×5): qty 1

## 2023-08-14 MED ORDER — METRONIDAZOLE 500 MG/100ML IV SOLN
500.0000 mg | Freq: Two times a day (BID) | INTRAVENOUS | Status: DC
  Administered 2023-08-14 – 2023-08-15 (×4): 500 mg via INTRAVENOUS
  Filled 2023-08-14 (×4): qty 100

## 2023-08-14 MED ORDER — ONDANSETRON HCL 4 MG/2ML IJ SOLN
4.0000 mg | Freq: Once | INTRAMUSCULAR | Status: AC
  Administered 2023-08-14: 4 mg via INTRAVENOUS
  Filled 2023-08-14: qty 2

## 2023-08-14 MED ORDER — SODIUM CHLORIDE 0.9 % IV SOLN
2.0000 g | Freq: Once | INTRAVENOUS | Status: AC
  Administered 2023-08-14: 2 g via INTRAVENOUS
  Filled 2023-08-14: qty 20

## 2023-08-14 MED ORDER — ENOXAPARIN SODIUM 40 MG/0.4ML IJ SOSY
40.0000 mg | PREFILLED_SYRINGE | INTRAMUSCULAR | Status: DC
  Administered 2023-08-15: 40 mg via SUBCUTANEOUS
  Filled 2023-08-14 (×2): qty 0.4

## 2023-08-14 MED ORDER — ACETAMINOPHEN 650 MG RE SUPP: 650.0000 mg | Freq: Four times a day (QID) | RECTAL | Status: DC | PRN

## 2023-08-14 MED ORDER — SODIUM CHLORIDE 0.9% FLUSH
3.0000 mL | Freq: Two times a day (BID) | INTRAVENOUS | Status: DC
  Administered 2023-08-14 – 2023-08-16 (×4): 3 mL via INTRAVENOUS

## 2023-08-14 MED ORDER — POLYETHYLENE GLYCOL 3350 17 G PO PACK: 17.0000 g | PACK | Freq: Every day | ORAL | Status: DC | PRN

## 2023-08-14 MED ORDER — POTASSIUM CHLORIDE 20 MEQ PO PACK
60.0000 meq | PACK | ORAL | Status: AC
Start: 2023-08-14 — End: 2023-08-14
  Administered 2023-08-14 (×2): 60 meq via ORAL
  Filled 2023-08-14 (×2): qty 3

## 2023-08-14 MED ORDER — MORPHINE SULFATE (PF) 4 MG/ML IV SOLN
4.0000 mg | Freq: Once | INTRAVENOUS | Status: AC
  Administered 2023-08-14: 4 mg via INTRAVENOUS
  Filled 2023-08-14: qty 1

## 2023-08-14 MED ORDER — IOHEXOL 350 MG/ML SOLN
100.0000 mL | Freq: Once | INTRAVENOUS | Status: AC | PRN
  Administered 2023-08-14: 100 mL via INTRAVENOUS

## 2023-08-14 MED ORDER — SODIUM CHLORIDE 0.9 % IV SOLN
2.0000 g | INTRAVENOUS | Status: DC
  Administered 2023-08-15 (×2): 2 g via INTRAVENOUS
  Filled 2023-08-14 (×2): qty 20

## 2023-08-14 MED ORDER — SODIUM CHLORIDE 0.9 % IV BOLUS
1000.0000 mL | Freq: Once | INTRAVENOUS | Status: AC
  Administered 2023-08-14: 1000 mL via INTRAVENOUS

## 2023-08-14 MED ORDER — METRONIDAZOLE 500 MG/100ML IV SOLN
500.0000 mg | Freq: Once | INTRAVENOUS | Status: AC
  Administered 2023-08-14: 500 mg via INTRAVENOUS
  Filled 2023-08-14: qty 100

## 2023-08-14 NOTE — Assessment & Plan Note (Signed)
 This is a possible diagnosis.  See CAT scan findings above.  Presented with leukocytosis as well as lipase here above normal at 52.  However patient does report some pleuritic pain.  Did workout yesterday.  Therefore differential includes consideration of venous thromboembolism which will be ruled out with D-dimer as well as possible musculoskeletal pain.  .  I think we should do a HIDA scan to confirm her diagnosis at this time.  S/p ceftriaxone  and metronidazole  which will be continued.  Maintain on n.p.o. status for now

## 2023-08-14 NOTE — ED Notes (Signed)
 Called [facility], pt bed assignment is ready

## 2023-08-14 NOTE — Assessment & Plan Note (Signed)
 These changes are incidentally noted on CAT scan for abdominal pain.  Appears to be compensated.  Patient is pending INR and PTT.  This will need outpatient workup.

## 2023-08-14 NOTE — Consult Note (Signed)
 Consult Note  [name] [name] [birth year]  [id number].    Requesting MD: Ozell Poisson, MD Chief Complaint/Reason for Consult: RUQ abdominal pain HPI:  Patient is a 68 year old female with known cirrhosis of the liver, followed previously by Dr. Teressa but now Dr. Avram, who presented to the ED with RUQ and epigastric abdominal pain. She reports she initially had an episode of epigastric abdominal pain with nausea 1 week ago. Pain was the worst pain she had ever felt and she tried several times to vomit but did not have any emesis. She felt significantly better the next morning and just figured she had a viral illness that resolved. Since then she has not been eating as much but denies fever, chills, nausea or vomiting. She reports pain started again yesterday evening and was more in RUQ. Pain started having some yogurt for dinner yesterday evening. She reports she is passing flatus although less that normal for her and she has not had a BM in a few days. She is normally very regular and has a BM most mornings prior to walking or going to the gym. She has been very stable as far as cirrhosis goes and does not have any chronic abdominal pain. She gets labs and US  every 6 months. She has been sober ~17 years. She walks 3-4 times per week and lives a fairly active life. Her husband who also has cirrhosis but with some hepatic encephalopathy had to be placed in SNF for rehab after a fall ~1 month ago - this has been significantly stressful for her as he does not seem to be doing well. She has never had any previous abdominal surgery and is not on any blood thinners. She is still having some RUQ abdominal pain but does report it is somewhat lessened after morphine  and zofran .   ROS: Negative other than HPI  Family History  Problem Relation Age of Onset   Diabetes Paternal Grandmother    Diabetes Paternal Grandfather    Depression Mother    Alcohol abuse Brother    Cancer Brother    Early  death Brother    Colon cancer Neg Hx    Colon polyps Neg Hx    Esophageal cancer Neg Hx    Rectal cancer Neg Hx    Stomach cancer Neg Hx     Past Medical History:  Diagnosis Date   Allergy    mild   Anxiety June 2024   Arthritis    Asthma    Cancer (HCC)    bladder cancer   Cirrhosis, alcoholic (HCC)    Esophageal varices (HCC)    GERD (gastroesophageal reflux disease)    Hypertension    Hyponatremia    Psoriasis     Past Surgical History:  Procedure Laterality Date   BASAL CELL CARCINOMA EXCISION Right    Ear   BLADDER SURGERY     Tumor removed    BREAST REDUCTION SURGERY     Bilateral    COLONOSCOPY     COSMETIC SURGERY  1992   REDUCTION MAMMAPLASTY     UPPER GASTROINTESTINAL ENDOSCOPY      Social History:  reports that she has never smoked. She has never used smokeless tobacco. She reports that she does not drink alcohol and does not use drugs.  Allergies:  Allergies  Allergen Reactions   Lisinopril     REACTION: unspecified   Sulfamethoxazole-Trimethoprim     REACTION: unspecified    Medications Prior  to Admission  Medication Sig Dispense Refill   albuterol  (VENTOLIN  HFA) 108 (90 Base) MCG/ACT inhaler Inhale 2 puffs into the lungs every 4 (four) hours as needed. 6.7 g 5   amLODipine  (NORVASC ) 5 MG tablet Take 1 tablet (5 mg total) by mouth daily. 90 tablet 3   b complex vitamins tablet Take 1 tablet by mouth daily.     fluticasone  (FLONASE ) 50 MCG/ACT nasal spray Place 2 sprays into both nostrils daily. 48 g 3   furosemide  (LASIX ) 40 MG tablet Take 1 tablet (40 mg total) by mouth daily. 90 tablet 3   LUTEIN-ZEAXANTHIN PO Take by mouth daily.     omeprazole (PRILOSEC OTC) 20 MG tablet Take 20 mg by mouth daily.     Prenatal Multivit-Min-Fe-FA (PRE-NATAL FORMULA) TABS Take 1 tablet by mouth daily.     Probiotic Product (PROBIOTIC DAILY PO) Take by mouth daily.     spironolactone  (ALDACTONE ) 50 MG tablet Take 1 tablet (50 mg total) by mouth daily. 90  tablet 3    Blood pressure 127/73, pulse 73, temperature 98.5 F (36.9 C), temperature source Oral, resp. rate 16, height 5' 3 (1.6 m), weight 61.2 kg, SpO2 95%. Physical Exam:  General: pleasant, WD, WN female who is laying in bed in NAD HEENT: head is normocephalic, atraumatic.  Sclera are anicteric. Pupils equal and round.  Ears and nose without any masses or lesions.  Mouth is pink and moist Heart: regular, rate, and rhythm. Palpable radial and pedal pulses bilaterally Lungs: CTAB, no wheezes, rhonchi, or rales noted.  Respiratory effort nonlabored Abd: soft, TTP in R abdomen without peritonitis, ND, no masses, hernias, or organomegaly MS: all 4 extremities are symmetrical with no cyanosis, clubbing, or edema. Skin: warm and dry with no masses, lesions, or rashes Neuro: Cranial nerves 2-12 grossly intact, sensation is normal throughout Psych: A&Ox3 with an appropriate affect.   Results for orders placed or performed during the hospital encounter of 08/14/23 (from the past 48 hours)  Lipase, blood     Status: Abnormal   Collection Time: 08/13/23 11:44 PM  Result Value Ref Range   Lipase 52 (H) 11 - 51 U/L    Comment: Performed at [facility], [address] [place], [place] [address]  Comprehensive metabolic panel     Status: Abnormal   Collection Time: 08/13/23 11:44 PM  Result Value Ref Range   Sodium 134 (L) 135 - 145 mmol/L   Potassium 3.2 (L) 3.5 - 5.1 mmol/L   Chloride 94 (L) 98 - 111 mmol/L   CO2 29 22 - 32 mmol/L   Glucose, Bld 148 (H) 70 - 99 mg/dL    Comment: Glucose reference range applies only to samples taken after fasting for at least 8 hours.   BUN 10 8 - 23 mg/dL   Creatinine, Ser 9.32 0.44 - 1.00 mg/dL   Calcium 9.6 8.9 - 89.6 mg/dL   Total Protein 7.6 6.5 - 8.1 g/dL   Albumin 4.3 3.5 - 5.0 g/dL   AST 18 15 - 41 U/L   ALT 16 0 - 44 U/L   Alkaline Phosphatase 125 38 - 126 U/L   Total Bilirubin 1.3 (H) 0.0 - 1.2 mg/dL   GFR, Estimated  >39 >39 mL/min    Comment: (NOTE) Calculated using the CKD-EPI Creatinine Equation (2021)    Anion gap 11 5 - 15    Comment: Performed at [facility], [address], [place], [place] [address]  CBC  Status: Abnormal   Collection Time: 08/13/23 11:44 PM  Result Value Ref Range   WBC 12.5 (H) 4.0 - 10.5 K/uL   RBC 4.44 3.87 - 5.11 MIL/uL   Hemoglobin 14.3 12.0 - 15.0 g/dL   HCT 58.8 63.9 - 53.9 %   MCV 92.6 80.0 - 100.0 fL   MCH 32.2 26.0 - 34.0 pg   MCHC 34.8 30.0 - 36.0 g/dL   RDW 87.2 88.4 - 84.4 %   Platelets 376 150 - 400 K/uL   nRBC 0.0 0.0 - 0.2 %    Comment: Performed at [facility], [address], [place], [place] [address]  Urinalysis, Routine w reflex microscopic -Urine, Clean Catch     Status: Abnormal   Collection Time: 08/13/23 11:44 PM  Result Value Ref Range   Color, Urine YELLOW YELLOW   APPearance CLOUDY (A) CLEAR   Specific Gravity, Urine 1.010 1.005 - 1.030   pH 8.0 5.0 - 8.0   Glucose, UA NEGATIVE NEGATIVE mg/dL   Hgb urine dipstick SMALL (A) NEGATIVE   Bilirubin Urine NEGATIVE NEGATIVE   Ketones, ur NEGATIVE NEGATIVE mg/dL   Protein, ur NEGATIVE NEGATIVE mg/dL   Nitrite NEGATIVE NEGATIVE   Leukocytes,Ua NEGATIVE NEGATIVE   RBC / HPF 0-5 0 - 5 RBC/hpf   WBC, UA 0-5 0 - 5 WBC/hpf   Bacteria, UA RARE (A) NONE SEEN   Squamous Epithelial / HPF 0-5 0 - 5 /HPF    Comment: Performed at [facility], [address], [place], [place] [address]  Hepatic function panel     Status: Abnormal   Collection Time: 08/14/23  6:25 AM  Result Value Ref Range   Total Protein 6.5 6.5 - 8.1 g/dL   Albumin 3.4 (L) 3.5 - 5.0 g/dL   AST 28 15 - 41 U/L   ALT 16 0 - 44 U/L   Alkaline Phosphatase 114 38 - 126 U/L   Total Bilirubin 1.4 (H) 0.0 - 1.2 mg/dL   Bilirubin, Direct 0.5 (H) 0.0 - 0.2 mg/dL   Indirect Bilirubin 0.9 0.3 - 0.9 mg/dL    Comment: Performed at [facility], [address], [place], [place] [address]   CT ABDOMEN PELVIS W CONTRAST Result Date: 08/14/2023 CLINICAL DATA:  Right side abdominal pain EXAM: CT ABDOMEN AND PELVIS WITH CONTRAST TECHNIQUE: Multidetector CT imaging of the abdomen and pelvis was performed using the standard protocol following bolus administration of intravenous contrast. RADIATION DOSE REDUCTION: This exam was performed according to the departmental dose-optimization program which includes automated exposure control, adjustment of the mA and/or kV according to patient size and/or use of iterative reconstruction technique. CONTRAST:  OMNIPAQUE  IOHEXOL  350 MG/ML SOLN COMPARISON:  11/19/2010 FINDINGS: Lower chest: Dependent atelectasis in the lower lobes. No effusions. Hepatobiliary: Gallbladder is distended with wall thickening. There may be stones within the fundus, but difficult to visualize. No biliary ductal dilatation or focal hepatic abnormality. Nodular contours of the liver surface compatible with cirrhosis. Recanalized umbilical vein. Pancreas: No focal abnormality or ductal dilatation. Spleen: No focal abnormality.  Normal size. Adrenals/Urinary Tract: No adrenal abnormality. No focal renal abnormality. No stones or hydronephrosis. Urinary bladder is unremarkable. Stomach/Bowel: Normal appendix. Stomach, large and small bowel grossly unremarkable. Vascular/Lymphatic: Aortic atherosclerosis. No evidence of aneurysm or adenopathy. Reproductive: Uterus and adnexa unremarkable.  No mass. Other: No free fluid or free air. Musculoskeletal: No acute bony abnormality. IMPRESSION: Distended gallbladder with wall thickening and surrounding inflammation. Appearance is concerning for acute  cholecystitis. Changes of cirrhosis with recanalized umbilical vein. Electronically Signed   By: Franky Crease M.D.   On: 08/14/2023 01:38      Assessment/Plan Cirrhosis of the liver RUQ abdominal pain  ?Constipation - CT AP today with distended gallbladder with  some wall thickening and surrounding inflammation, changes of cirrhosis with recanalized umbilical vein; I reviewed imaging myself and also note some scattered stool throughout large bowel and patient reports new constipation over the last week - no gallstones on any prior imaging, gets US  biannually  - lipase minimally elevated at 52, Tbili 1.4 with baseline at 1.0 - these findings may be secondary to just dehydration  - AST/ALT/Alk Phos all WNL - mild leukocytosis of 12.5 on admit and resolved today - afebrile and HD stable  - will discuss with attending MD but for now with start with treating constipation and seeing if that improves symptoms, acalculous cholecystitis does not seem the most likely explanation of this to me. If more concern for this develops then IR cholecystostomy tube may be the best treatment for this  - no indication for emergent surgical intervention at this time  FEN: NPO, IVF VTE: LMWH ID: rocephin /flagyl    - per [facility] -  HTN GERD Hx of bladder cancer  Asthma Psoriasis   I reviewed ED provider notes, last 24 h vitals and pain scores, last 48 h intake and output, last 24 h labs and trends, and last 24 h imaging results.  This care required high  level of medical decision making.   Burnard JONELLE Louder, [facility] Surgery 08/14/2023, 10:42 AM Please see Amion for pager number during day hours 7:00am-4:30pm

## 2023-08-14 NOTE — ED Provider Notes (Addendum)
 [facility] Emergency Department Provider Note MRN:  [id number]  Arrival date & time: 08/14/23     Chief Complaint   Abdominal Pain   History of Present Illness   [name] [name] is a 68 y.o. year-old female with a history of cirrhosis presenting to the ED with chief complaint of abdominal pain.  Abdominal pain that started about 5 hours ago, right-sided, not going away, seem to happen after eating some yogurt but seem more severe than normal.  Felt some epigastric discomfort last week in the setting of what she thought was a viral illness, went away but now it is back and more on the right side.  Review of Systems  A thorough review of systems was obtained and all systems are negative except as noted in the HPI and PMH.   Patient's Health History    Past Medical History:  Diagnosis Date   Allergy    mild   Anxiety June 2024   Arthritis    Asthma    Cancer (HCC)    bladder cancer   Cirrhosis, alcoholic (HCC)    Esophageal varices (HCC)    GERD (gastroesophageal reflux disease)    Hypertension    Hyponatremia    Psoriasis     Past Surgical History:  Procedure Laterality Date   BASAL CELL CARCINOMA EXCISION Right    Ear   BLADDER SURGERY     Tumor removed    BREAST REDUCTION SURGERY     Bilateral    COLONOSCOPY     COSMETIC SURGERY  1992   REDUCTION MAMMAPLASTY     UPPER GASTROINTESTINAL ENDOSCOPY      Family History  Problem Relation Age of Onset   Diabetes Paternal Grandmother    Diabetes Paternal Grandfather    Depression Mother    Alcohol abuse Brother    Cancer Brother    Early death Brother    Colon cancer Neg Hx    Colon polyps Neg Hx    Esophageal cancer Neg Hx    Rectal cancer Neg Hx    Stomach cancer Neg Hx     Social History   Socioeconomic History   Marital status: Married    Spouse name: Not on file   Number of children: Not on file   Years of education: Not on file   Highest education level: Bachelor's degree  (e.g., BA, AB, BS)  Occupational History   Not on file  Tobacco Use   Smoking status: Never   Smokeless tobacco: Never  Vaping Use   Vaping status: Never Used  Substance and Sexual Activity   Alcohol use: No   Drug use: No   Sexual activity: Not Currently    Birth control/protection: None  Other Topics Concern   Not on file  Social History Narrative   Not on file   Social Drivers of Health   Financial Resource Strain: Low Risk  (07/30/2023)   Overall Financial Resource Strain (CARDIA)    Difficulty of Paying Living Expenses: Not hard at all  Food Insecurity: No Food Insecurity (07/30/2023)   Hunger Vital Sign    Worried About Running Out of Food in the Last Year: Never true    Ran Out of Food in the Last Year: Never true  Transportation Needs: No Transportation Needs (07/30/2023)   PRAPARE - Administrator, Civil Service (Medical): No    Lack of Transportation (Non-Medical): No  Physical Activity: Sufficiently Active (07/30/2023)   Exercise Vital Sign  Days of Exercise per Week: 6 days    Minutes of Exercise per Session: 40 min  Stress: Stress Concern Present (07/30/2023)   [employer] of Occupational Health - Occupational Stress Questionnaire    Feeling of Stress : To some extent  Social Connections: Socially Integrated (07/30/2023)   Social Connection and Isolation Panel [NHANES]    Frequency of Communication with Friends and Family: More than three times a week    Frequency of Social Gatherings with Friends and Family: More than three times a week    Attends Religious Services: 1 to 4 times per year    Active Member of [employer] or Organizations: Yes    Attends Engineer, Structural: More than 4 times per year    Marital Status: Married  Catering Manager Violence: Not on file     Physical Exam   Vitals:   08/14/23 0530 08/14/23 0600  BP: 113/74 114/77  Pulse: 71 69  Resp:    Temp:    SpO2: 93% 92%    CONSTITUTIONAL: Well-appearing,  NAD NEURO/PSYCH:  Alert and oriented x 3, no focal deficits EYES:  eyes equal and reactive ENT/NECK:  no LAD, no JVD CARDIO: Regular rate, well-perfused, normal S1 and S2 PULM:  CTAB no wheezing or rhonchi GI/GU:  non-distended, non-tender MSK/SPINE:  No gross deformities, no edema SKIN:  no rash, atraumatic   *Additional and/or pertinent findings included in MDM below  Diagnostic and Interventional Summary    EKG Interpretation Date/Time:    Ventricular Rate:    PR Interval:    QRS Duration:    QT Interval:    QTC Calculation:   R Axis:      Text Interpretation:         Labs Reviewed  LIPASE, BLOOD - Abnormal; Notable for the following components:      Result Value   Lipase 52 (*)    All other components within normal limits  COMPREHENSIVE METABOLIC PANEL - Abnormal; Notable for the following components:   Sodium 134 (*)    Potassium 3.2 (*)    Chloride 94 (*)    Glucose, Bld 148 (*)    Total Bilirubin 1.3 (*)    All other components within normal limits  CBC - Abnormal; Notable for the following components:   WBC 12.5 (*)    All other components within normal limits  URINALYSIS, ROUTINE W REFLEX MICROSCOPIC - Abnormal; Notable for the following components:   APPearance CLOUDY (*)    Hgb urine dipstick SMALL (*)    Bacteria, UA RARE (*)    All other components within normal limits  HEPATIC FUNCTION PANEL    CT ABDOMEN PELVIS W CONTRAST  Final Result      Medications  iohexol  (OMNIPAQUE ) 350 MG/ML injection 100 mL (100 mLs Intravenous Contrast Given 08/14/23 0112)  sodium chloride  0.9 % bolus 1,000 mL (0 mLs Intravenous Stopped 08/14/23 0430)  cefTRIAXone  (ROCEPHIN ) 2 g in sodium chloride  0.9 % 100 mL IVPB (0 g Intravenous Stopped 08/14/23 0333)  metroNIDAZOLE  (FLAGYL ) IVPB 500 mg (0 mg Intravenous Stopped 08/14/23 0500)  morphine  (PF) 4 MG/ML injection 4 mg (4 mg Intravenous Given 08/14/23 0410)  ondansetron  (ZOFRAN ) injection 4 mg (4 mg Intravenous Given 08/14/23  0409)     Procedures  /  Critical Care .Critical Care  Performed by: Theadore Ozell HERO, MD Authorized by: Theadore Ozell HERO, MD   Critical care provider statement:    Critical care time (minutes):  45   Critical  care was necessary to treat or prevent imminent or life-threatening deterioration of the following conditions: Acute cholecystitis.   Critical care was time spent personally by me on the following activities:  Development of treatment plan with patient or surrogate, discussions with consultants, evaluation of patient's response to treatment, examination of patient, ordering and review of laboratory studies, ordering and review of radiographic studies, ordering and performing treatments and interventions, pulse oximetry, re-evaluation of patient's condition and review of old charts   ED Course and Medical Decision Making  Initial Impression and Ddx Differential diagnosis includes gastritis, pancreatitis, cholelithiasis, cholecystitis, colitis  Past medical/surgical history that increases complexity of ED encounter: Alcoholic cirrhosis  Interpretation of Diagnostics I personally reviewed the laboratory assessment and my interpretation is as follows: Minimal LFT elevation, mild leukocytosis  Ultrasound suspicious for acute cholecystitis.  Patient Reassessment and Ultimate Disposition/Management     Case discussed with Dr. Polly of general surgery, plan is for OR intervention.  Will be kept here until she can go to preop.  Starting antibiotics.  6:45 AM update: Case reviewed by Dr. Dasie of general surgery, given patient's history of cirrhosis and portal hypertension may be too high risk for cholecystectomy.  Recommending hospitalist admission with general surgery consultation, [facility] may be best option for the patient.  Patient management required discussion with the following services or consulting groups:  General/Trauma Surgery  Complexity of Problems Addressed Acute  illness or injury that poses threat of life of bodily function  Additional Data Reviewed and Analyzed Further history obtained from: Prior labs/imaging results  Additional Factors Impacting ED Encounter Risk Consideration of hospitalization  Ozell HERO. Theadore, MD [facility] Emergency Medicine [facility] [email or web address]  Final Clinical Impressions(s) / ED Diagnoses     ICD-10-CM   1. Cholecystitis  K81.9       ED Discharge Orders     None        Discharge Instructions Discussed with and Provided to Patient:   Discharge Instructions   None      Theadore Ozell HERO, MD 08/14/23 [id number]    Theadore Ozell HERO, MD 08/14/23 [phone]

## 2023-08-14 NOTE — H&P (Addendum)
 History and Physical    Patient: [name] [name] FMW:[id number] DOB: September 26, 1955 DOA: 08/14/2023 DOS: the patient was seen and examined on 08/14/2023 PCP: Rollene Almarie LABOR, MD  Patient coming from: Home> [facility]  Chief Complaint:  Chief Complaint  Patient presents with   Abdominal Pain   HPI: [name] [name] is a 68 y.o. female with medical history significant of medical issues as listed below. However, she is pretty functional.  She engages in long distance running and uses the gym twice a day.  She actually used upper body strength training per her usual routine yesterday at the gym.  Yesterday evening at approximately 7 PM or so patient reports abrupt onset of right upper abdominal area discomfort versus right lower chest that was worse with inspiration.  It was moderate to severe in intensity otherwise no aggravating or relieving factor identified.  Not associated with vomiting or diarrhea.  No fever.  Patient does not repeat report a pattern of similar discomfort in the past. No radiation of pain  She does recall about 7 days ago she had a bout of 1 day of epigastric nausea as well as discomfort/pain that self resolved within a day.  Patient presented to [facility] yesterday around midnight with above complaints finding of minimal leukocytosis of 12.2, which has since then resolved to 10.3.  Also imaging finding of distended gallbladder with wall thickening and surrounding inflammation on CAT scan. Review of Systems: As mentioned in the history of present illness. All other systems reviewed and are negative. Past Medical History:  Diagnosis Date   Allergy    mild   Anxiety June 2024   Arthritis    Asthma    Cancer (HCC)    bladder cancer   Cirrhosis, alcoholic (HCC)    Esophageal varices (HCC)    GERD (gastroesophageal reflux disease)    Hypertension    Hyponatremia    Psoriasis    Past Surgical History:  Procedure Laterality Date   BASAL CELL CARCINOMA EXCISION  Right    Ear   BLADDER SURGERY     Tumor removed    BREAST REDUCTION SURGERY     Bilateral    COLONOSCOPY     COSMETIC SURGERY  1992   REDUCTION MAMMAPLASTY     UPPER GASTROINTESTINAL ENDOSCOPY     Social History:  reports that she has never smoked. She has never used smokeless tobacco. She reports that she does not drink alcohol and does not use drugs.  Allergies  Allergen Reactions   Lisinopril     REACTION: unspecified   Sulfamethoxazole-Trimethoprim     REACTION: unspecified    Family History  Problem Relation Age of Onset   Diabetes Paternal Grandmother    Diabetes Paternal Grandfather    Depression Mother    Alcohol abuse Brother    Cancer Brother    Early death Brother    Colon cancer Neg Hx    Colon polyps Neg Hx    Esophageal cancer Neg Hx    Rectal cancer Neg Hx    Stomach cancer Neg Hx     Prior to Admission medications   Medication Sig Start Date End Date Taking? Authorizing Provider  albuterol  (VENTOLIN  HFA) 108 (90 Base) MCG/ACT inhaler Inhale 2 puffs into the lungs every 4 (four) hours as needed. 08/02/23   Rollene Almarie LABOR, MD  amLODipine  (NORVASC ) 5 MG tablet Take 1 tablet (5 mg total) by mouth daily. 08/31/22     b complex vitamins tablet  Take 1 tablet by mouth daily.    [provider]  fluticasone  (FLONASE ) 50 MCG/ACT nasal spray Place 2 sprays into both nostrils daily. 08/02/23   Rollene Almarie LABOR, MD  furosemide  (LASIX ) 40 MG tablet Take 1 tablet (40 mg total) by mouth daily. 02/19/23   Avram Lupita BRAVO, MD  LUTEIN-ZEAXANTHIN PO Take by mouth daily.    [provider]  omeprazole (PRILOSEC OTC) 20 MG tablet Take 20 mg by mouth daily.    [provider]  Prenatal Multivit-Min-Fe-FA (PRE-NATAL FORMULA) TABS Take 1 tablet by mouth daily.    [provider]  Probiotic Product (PROBIOTIC DAILY PO) Take by mouth daily.    [provider]  spironolactone  (ALDACTONE ) 50 MG tablet Take 1 tablet (50 mg total)  by mouth daily. 11/24/22 11/24/23  Avram Lupita BRAVO, MD    Physical Exam: Vitals:   08/14/23 0600 08/14/23 0700 08/14/23 0745 08/14/23 0931  BP: 114/77 119/69 119/73 127/73  Pulse: 69 72 71 73  Resp:  16 16 16   Temp:   98.2 F (36.8 C) 98.5 F (36.9 C)  TempSrc:   Oral Oral  SpO2: 92% 93% 94% 95%  Weight:      Height:       General: Patient is alert and awake, without distress, friend at bedside.  Patient's pain is largely resolved, now having minimal discomfort.  Which she reiterates is related to breathing.  There is no radiation Respiratory exam: Bilateral intravesicular.  Minimal bibasilar crackles are heard. Cardiovascular exam S1-S2 normal Abdomen all quadrants are soft.  There is some tenderness reported by patient to deep palpation of the right subcostal angle.  However there is no inspiratory arrest. Extremities warm without edema. Data Reviewed:  Labs on Admission:  Results for orders placed or performed during the hospital encounter of 08/14/23 (from the past 24 hours)  Lipase, blood     Status: Abnormal   Collection Time: 08/13/23 11:44 PM  Result Value Ref Range   Lipase 52 (H) 11 - 51 U/L  Comprehensive metabolic panel     Status: Abnormal   Collection Time: 08/13/23 11:44 PM  Result Value Ref Range   Sodium 134 (L) 135 - 145 mmol/L   Potassium 3.2 (L) 3.5 - 5.1 mmol/L   Chloride 94 (L) 98 - 111 mmol/L   CO2 29 22 - 32 mmol/L   Glucose, Bld 148 (H) 70 - 99 mg/dL   BUN 10 8 - 23 mg/dL   Creatinine, Ser 9.32 0.44 - 1.00 mg/dL   Calcium 9.6 8.9 - 89.6 mg/dL   Total Protein 7.6 6.5 - 8.1 g/dL   Albumin 4.3 3.5 - 5.0 g/dL   AST 18 15 - 41 U/L   ALT 16 0 - 44 U/L   Alkaline Phosphatase 125 38 - 126 U/L   Total Bilirubin 1.3 (H) 0.0 - 1.2 mg/dL   GFR, Estimated >39 >39 mL/min   Anion gap 11 5 - 15  CBC     Status: Abnormal   Collection Time: 08/13/23 11:44 PM  Result Value Ref Range   WBC 12.5 (H) 4.0 - 10.5 K/uL   RBC 4.44 3.87 - 5.11 MIL/uL   Hemoglobin  14.3 12.0 - 15.0 g/dL   HCT 58.8 63.9 - 53.9 %   MCV 92.6 80.0 - 100.0 fL   MCH 32.2 26.0 - 34.0 pg   MCHC 34.8 30.0 - 36.0 g/dL   RDW 87.2 88.4 - 84.4 %   Platelets 376 150 -  400 K/uL   nRBC 0.0 0.0 - 0.2 %  Urinalysis, Routine w reflex microscopic -Urine, Clean Catch     Status: Abnormal   Collection Time: 08/13/23 11:44 PM  Result Value Ref Range   Color, Urine YELLOW YELLOW   APPearance CLOUDY (A) CLEAR   Specific Gravity, Urine 1.010 1.005 - 1.030   pH 8.0 5.0 - 8.0   Glucose, UA NEGATIVE NEGATIVE mg/dL   Hgb urine dipstick SMALL (A) NEGATIVE   Bilirubin Urine NEGATIVE NEGATIVE   Ketones, ur NEGATIVE NEGATIVE mg/dL   Protein, ur NEGATIVE NEGATIVE mg/dL   Nitrite NEGATIVE NEGATIVE   Leukocytes,Ua NEGATIVE NEGATIVE   RBC / HPF 0-5 0 - 5 RBC/hpf   WBC, UA 0-5 0 - 5 WBC/hpf   Bacteria, UA RARE (A) NONE SEEN   Squamous Epithelial / HPF 0-5 0 - 5 /HPF  Hepatic function panel     Status: Abnormal   Collection Time: 08/14/23  6:25 AM  Result Value Ref Range   Total Protein 6.5 6.5 - 8.1 g/dL   Albumin 3.4 (L) 3.5 - 5.0 g/dL   AST 28 15 - 41 U/L   ALT 16 0 - 44 U/L   Alkaline Phosphatase 114 38 - 126 U/L   Total Bilirubin 1.4 (H) 0.0 - 1.2 mg/dL   Bilirubin, Direct 0.5 (H) 0.0 - 0.2 mg/dL   Indirect Bilirubin 0.9 0.3 - 0.9 mg/dL  CBC     Status: None   Collection Time: 08/14/23 10:25 AM  Result Value Ref Range   WBC 10.3 4.0 - 10.5 K/uL   RBC 4.13 3.87 - 5.11 MIL/uL   Hemoglobin 13.5 12.0 - 15.0 g/dL   HCT 61.3 63.9 - 53.9 %   MCV 93.5 80.0 - 100.0 fL   MCH 32.7 26.0 - 34.0 pg   MCHC 35.0 30.0 - 36.0 g/dL   RDW 86.9 88.4 - 84.4 %   Platelets 350 150 - 400 K/uL   nRBC 0.0 0.0 - 0.2 %  Creatinine, serum     Status: None   Collection Time: 08/14/23 10:25 AM  Result Value Ref Range   Creatinine, Ser 0.77 0.44 - 1.00 mg/dL   GFR, Estimated >39 >39 mL/min   Basic Metabolic Panel: Recent Labs  Lab 08/13/23 2344 08/14/23 1025  NA 134*  --   K 3.2*  --   CL 94*  --    CO2 29  --   GLUCOSE 148*  --   BUN 10  --   CREATININE 0.67 0.77  CALCIUM 9.6  --    Liver Function Tests: Recent Labs  Lab 08/13/23 2344 08/14/23 0625  AST 18 28  ALT 16 16  ALKPHOS 125 114  BILITOT 1.3* 1.4*  PROT 7.6 6.5  ALBUMIN 4.3 3.4*   Recent Labs  Lab 08/13/23 2344  LIPASE 52*   No results for input(s): AMMONIA in the last 168 hours. CBC: Recent Labs  Lab 08/13/23 2344 08/14/23 1025  WBC 12.5* 10.3  HGB 14.3 13.5  HCT 41.1 38.6  MCV 92.6 93.5  PLT 376 350   Cardiac Enzymes: No results for input(s): CKTOTAL, CKMB, CKMBINDEX, TROPONINIHS in the last 168 hours.  BNP (last 3 results) No results for input(s): PROBNP in the last 8760 hours. CBG: No results for input(s): GLUCAP in the last 168 hours.  Radiological Exams on Admission:  CT ABDOMEN PELVIS W CONTRAST Result Date: 08/14/2023 CLINICAL DATA:  Right side abdominal pain EXAM: CT ABDOMEN AND PELVIS WITH CONTRAST  TECHNIQUE: Multidetector CT imaging of the abdomen and pelvis was performed using the standard protocol following bolus administration of intravenous contrast. RADIATION DOSE REDUCTION: This exam was performed according to the departmental dose-optimization program which includes automated exposure control, adjustment of the mA and/or kV according to patient size and/or use of iterative reconstruction technique. CONTRAST:  OMNIPAQUE  IOHEXOL  350 MG/ML SOLN COMPARISON:  11/19/2010 FINDINGS: Lower chest: Dependent atelectasis in the lower lobes. No effusions. Hepatobiliary: Gallbladder is distended with wall thickening. There may be stones within the fundus, but difficult to visualize. No biliary ductal dilatation or focal hepatic abnormality. Nodular contours of the liver surface compatible with cirrhosis. Recanalized umbilical vein. Pancreas: No focal abnormality or ductal dilatation. Spleen: No focal abnormality.  Normal size. Adrenals/Urinary Tract: No adrenal abnormality. No focal  renal abnormality. No stones or hydronephrosis. Urinary bladder is unremarkable. Stomach/Bowel: Normal appendix. Stomach, large and small bowel grossly unremarkable. Vascular/Lymphatic: Aortic atherosclerosis. No evidence of aneurysm or adenopathy. Reproductive: Uterus and adnexa unremarkable.  No mass. Other: No free fluid or free air. Musculoskeletal: No acute bony abnormality. IMPRESSION: Distended gallbladder with wall thickening and surrounding inflammation. Appearance is concerning for acute cholecystitis. Changes of cirrhosis with recanalized umbilical vein. Electronically Signed   By: Franky Crease M.D.   On: 08/14/2023 01:38     No intake/output data recorded. No intake/output data recorded.       Assessment and Plan: * Acute acalculous cholecystitis This is a possible diagnosis.  See CAT scan findings above.  Presented with leukocytosis as well as lipase here above normal at 52.  However patient does report some pleuritic pain.  Did workout yesterday.  Therefore differential includes consideration of venous thromboembolism which will be ruled out with D-dimer as well as possible musculoskeletal pain.  .  I think we should do a HIDA scan to confirm her diagnosis at this time.  S/p ceftriaxone  and metronidazole  which will be continued.  Maintain on n.p.o. status for now  Cirrhosis [facility]) These changes are incidentally noted on CAT scan for abdominal pain.  Appears to be compensated.  Patient is pending INR and PTT.  This will need outpatient workup.  Atelectasis Seen on CT , cororboated by exam. Inspirex ordered   Home med rec pending pharmcy input.  CXR pending. EKG pending.  Advance Care Planning:   Code Status: Full Code   Consults: generall surgery engaged. Look forward to their evaluation at this time.  Family Communication: per patient.  Severity of Illness: The appropriate patient status for this patient is INPATIENT. Inpatient status is judged to be reasonable and  necessary in order to provide the required intensity of service to ensure the patient's safety. The patient's presenting symptoms, physical exam findings, and initial radiographic and laboratory data in the context of their chronic comorbidities is felt to place them at high risk for further clinical deterioration. Furthermore, it is not anticipated that the patient will be medically stable for discharge from the hospital within 2 midnights of admission.   * I certify that at the point of admission it is my clinical judgment that the patient will require inpatient hospital care spanning beyond 2 midnights from the point of admission due to high intensity of service, high risk for further deterioration and high frequency of surveillance required.*  Author: Jacqulyn Divine, MD 08/14/2023 11:00 AM  For on call review [email or web address].

## 2023-08-14 NOTE — Plan of Care (Signed)
 Patient alert/oriented X4. Skin assessed with Geraldina, RN, no skin issues noted on admission. Patient VSS upon admission and tolerated IV antibiotics. Patient ambulated around room independently, no complaints at this time.   Problem: Education: Goal: Knowledge of General Education information will improve Description: Including pain rating scale, medication(s)/side effects and non-pharmacologic comfort measures Outcome: Progressing   Problem: Health Behavior/Discharge Planning: Goal: Ability to manage health-related needs will improve Outcome: Progressing   Problem: Clinical Measurements: Goal: Ability to maintain clinical measurements within normal limits will improve Outcome: Progressing   Problem: Clinical Measurements: Goal: Will remain free from infection Outcome: Progressing   Problem: Clinical Measurements: Goal: Diagnostic test results will improve Outcome: Progressing   Problem: Clinical Measurements: Goal: Respiratory complications will improve Outcome: Progressing   Problem: Clinical Measurements: Goal: Cardiovascular complication will be avoided Outcome: Progressing   Problem: Activity: Goal: Risk for activity intolerance will decrease Outcome: Progressing   Problem: Nutrition: Goal: Adequate nutrition will be maintained Outcome: Progressing   Problem: Coping: Goal: Level of anxiety will decrease Outcome: Progressing   Problem: Elimination: Goal: Will not experience complications related to bowel motility Outcome: Progressing   Problem: Elimination: Goal: Will not experience complications related to urinary retention Outcome: Progressing   Problem: Pain Managment: Goal: General experience of comfort will improve and/or be controlled Outcome: Progressing   Problem: Safety: Goal: Ability to remain free from injury will improve Outcome: Progressing   Problem: Skin Integrity: Goal: Risk for impaired skin integrity will decrease Outcome:  Progressing

## 2023-08-14 NOTE — ED Notes (Signed)
 I have called report to [facility] and to Bambi Lever, Charity fundraiser at [facility].

## 2023-08-14 NOTE — Assessment & Plan Note (Signed)
 Seen on CT , cororboated by exam. Inspirex ordered

## 2023-08-15 ENCOUNTER — Inpatient Hospital Stay (HOSPITAL_BASED_OUTPATIENT_CLINIC_OR_DEPARTMENT_OTHER): Payer: Commercial Managed Care - PPO

## 2023-08-15 DIAGNOSIS — R109 Unspecified abdominal pain: Secondary | ICD-10-CM | POA: Diagnosis present

## 2023-08-15 DIAGNOSIS — R1011 Right upper quadrant pain: Secondary | ICD-10-CM | POA: Diagnosis not present

## 2023-08-15 DIAGNOSIS — K810 Acute cholecystitis: Secondary | ICD-10-CM | POA: Diagnosis not present

## 2023-08-15 DIAGNOSIS — R609 Edema, unspecified: Secondary | ICD-10-CM | POA: Diagnosis not present

## 2023-08-15 DIAGNOSIS — K7469 Other cirrhosis of liver: Secondary | ICD-10-CM | POA: Diagnosis not present

## 2023-08-15 LAB — PROTIME-INR
INR: 1.3 — ABNORMAL HIGH (ref 0.8–1.2)
Prothrombin Time: 16.5 s — ABNORMAL HIGH (ref 11.4–15.2)

## 2023-08-15 LAB — CBC
HCT: 36.8 % (ref 36.0–46.0)
Hemoglobin: 12.6 g/dL (ref 12.0–15.0)
MCH: 32.3 pg (ref 26.0–34.0)
MCHC: 34.2 g/dL (ref 30.0–36.0)
MCV: 94.4 fL (ref 80.0–100.0)
Platelets: 329 10*3/uL (ref 150–400)
RBC: 3.9 MIL/uL (ref 3.87–5.11)
RDW: 13 % (ref 11.5–15.5)
WBC: 7.7 10*3/uL (ref 4.0–10.5)
nRBC: 0 % (ref 0.0–0.2)

## 2023-08-15 LAB — BASIC METABOLIC PANEL
Anion gap: 9 (ref 5–15)
BUN: 5 mg/dL — ABNORMAL LOW (ref 8–23)
CO2: 24 mmol/L (ref 22–32)
Calcium: 8.5 mg/dL — ABNORMAL LOW (ref 8.9–10.3)
Chloride: 104 mmol/L (ref 98–111)
Creatinine, Ser: 0.66 mg/dL (ref 0.44–1.00)
GFR, Estimated: >60 mL/min (ref >60)
Glucose, Bld: 88 mg/dL (ref 70–99)
Potassium: 4 mmol/L (ref 3.5–5.1)
Sodium: 137 mmol/L (ref 135–145)

## 2023-08-15 LAB — APTT: aPTT: 41 s — ABNORMAL HIGH (ref 24–36)

## 2023-08-15 MED ORDER — POTASSIUM CHLORIDE 2 MEQ/ML IV SOLN
INTRAVENOUS | Status: DC
  Filled 2023-08-15 (×2): qty 1000

## 2023-08-15 MED ORDER — KCL IN DEXTROSE-NACL 20-5-0.9 MEQ/L-%-% IV SOLN
INTRAVENOUS | Status: DC
  Filled 2023-08-15: qty 1000

## 2023-08-15 NOTE — Progress Notes (Signed)
 VASCULAR LAB    Bilateral lower extremity venous duplex has been performed.  See CV proc for preliminary results.   Caine Barfield, RVT 08/15/2023, 9:37 AM

## 2023-08-15 NOTE — Progress Notes (Signed)
 PROGRESS NOTE    [name] [name]  FMW:[id number] DOB: September 17, 1955 DOA: 08/14/2023 PCP: Rollene Almarie LABOR, MD  Outpatient Specialists:     Brief Narrative:  Patient is a 68 year old female past medical history significant for bladder cancer, alcoholic liver cirrhosis, esophageal varices, GERD, hypertension, psoriasis and hyponatremia.  Patient was admitted with concerns for possible acute acalculous cholecystitis.  Patient is currently on IV Flagyl  and ceftriaxone .  Patient is being managed conservatively.  Symptoms seems to be resolving.  Surgical input is appreciated.   08/15/2023:  Patient seen.  Above history noted.  No new complaints.  No right upper quadrant pain reported.  No fever or chills.  No nausea or vomiting.   Assessment & Plan:   Principal Problem:   Acute acalculous cholecystitis Active Problems:   Atelectasis   Cirrhosis (HCC)   Abdominal pain   Acute acalculous cholecystitis: -Continue IV Rocephin  and Flagyl . -Symptoms have improved significantly. -No abdominal pain or other constitutional symptoms endorsed. -CT report is noted. -Patient is currently being managed conservatively.  Liver cirrhosis: -Stable. -Follow-up with GI team on discharge.  Atelectasis: -Incentive spirometry.   DVT prophylaxis: Subcutaneous Lovenox  Code Status: Full code. Family Communication:  Disposition Plan: Patient has been placed on observation.   Consultants:  Surgery.  Procedures:  None.  Antimicrobials:  IV ceftriaxone . IV Flagyl    Subjective: No new complaints.  Objective: Vitals:   08/14/23 1958 08/15/23 0005 08/15/23 0435 08/15/23 0732  BP: 122/77 118/78 125/78 122/74  Pulse: 79 74 70 69  Resp: 18 18 18 16   Temp: 98 F (36.7 C) 98.4 F (36.9 C) 98.9 F (37.2 C) 98.1 F (36.7 C)  TempSrc: Oral  Oral Oral  SpO2: 95% 90% 93% 95%  Weight:      Height:        Intake/Output Summary (Last 24 hours) at 08/15/2023 1555 Last data filed at 08/15/2023  1330 Gross per 24 hour  Intake 920 ml  Output --  Net 920 ml   Filed Weights   08/13/23 2339  Weight: 61.2 kg    Examination:  General exam: Appears calm and comfortable  Respiratory system: Clear to auscultation. Cardiovascular system: S1 & S2 heard. Gastrointestinal system: Abdomen is soft and nontender.  No right upper quadrant tenderness.   Central nervous system: Alert and oriented.  Patient moves all extremities. Extremities: No leg edema  Data Reviewed: I have personally reviewed following labs and imaging studies  CBC: Recent Labs  Lab 08/13/23 2344 08/14/23 1025 08/15/23 0057  WBC 12.5* 10.3 7.7  HGB 14.3 13.5 12.6  HCT 41.1 38.6 36.8  MCV 92.6 93.5 94.4  PLT 376 350 329   Basic Metabolic Panel: Recent Labs  Lab 08/13/23 2344 08/14/23 1025 08/15/23 0057  NA 134*  --  137  K 3.2*  --  4.0  CL 94*  --  104  CO2 29  --  24  GLUCOSE 148*  --  88  BUN 10  --  5*  CREATININE 0.67 0.77 0.66  CALCIUM 9.6  --  8.5*   GFR: Estimated Creatinine Clearance: 55.7 mL/min (by C-G formula based on SCr of 0.66 mg/dL). Liver Function Tests: Recent Labs  Lab 08/13/23 2344 08/14/23 0625  AST 18 28  ALT 16 16  ALKPHOS 125 114  BILITOT 1.3* 1.4*  PROT 7.6 6.5  ALBUMIN 4.3 3.4*   Recent Labs  Lab 08/13/23 2344  LIPASE 52*   No results for input(s): AMMONIA in the last 168 hours. Coagulation  Profile: Recent Labs  Lab 08/15/23 0057  INR 1.3*   Cardiac Enzymes: No results for input(s): CKTOTAL, CKMB, CKMBINDEX, TROPONINI in the last 168 hours. BNP (last 3 results) No results for input(s): PROBNP in the last 8760 hours. HbA1C: No results for input(s): HGBA1C in the last 72 hours. CBG: No results for input(s): GLUCAP in the last 168 hours. Lipid Profile: No results for input(s): CHOL, HDL, LDLCALC, TRIG, CHOLHDL, LDLDIRECT in the last 72 hours. Thyroid Function Tests: No results for input(s): TSH, T4TOTAL, FREET4,  T3FREE, THYROIDAB in the last 72 hours. Anemia Panel: No results for input(s): VITAMINB12, FOLATE, FERRITIN, TIBC, IRON, RETICCTPCT in the last 72 hours. Urine analysis:    Component Value Date/Time   COLORURINE YELLOW 08/13/2023 2344   APPEARANCEUR CLOUDY (A) 08/13/2023 2344   LABSPEC 1.010 08/13/2023 2344   PHURINE 8.0 08/13/2023 2344   GLUCOSEU NEGATIVE 08/13/2023 2344   HGBUR SMALL (A) 08/13/2023 2344   BILIRUBINUR NEGATIVE 08/13/2023 2344   KETONESUR NEGATIVE 08/13/2023 2344   PROTEINUR NEGATIVE 08/13/2023 2344   UROBILINOGEN 1.0 01/01/2015 0419   NITRITE NEGATIVE 08/13/2023 2344   LEUKOCYTESUR NEGATIVE 08/13/2023 2344   Sepsis Labs: @LABRCNTIP (procalcitonin:4,lacticidven:4)  )No results found for this or any previous visit (from the past 240 hours).       Radiology Studies: VAS US  LOWER EXTREMITY VENOUS (DVT) Result Date: 08/15/2023  Lower Venous DVT Study Patient Name:  [name] [name]  Date of Exam:   08/15/2023 Medical Rec #: [id number]          Accession #:    [id number] Date of Birth: [birth year]          Patient Gender: F Patient Age:   68 years Exam Location:  [facility] Procedure:      VAS US  LOWER EXTREMITY VENOUS (DVT) Referring Phys: [name] [name] --------------------------------------------------------------------------------  Indications: Right upper quadrant pain.  Comparison Study: No prior study on file Performing Technologist: Alberta Lis RVS  Examination Guidelines: A complete evaluation includes B-mode imaging, spectral Doppler, color Doppler, and power Doppler as needed of all accessible portions of each vessel. Bilateral testing is considered an integral part of a complete examination. Limited examinations for reoccurring indications may be performed as noted. The reflux portion of the exam is performed with the patient in reverse Trendelenburg.  +---------+---------------+---------+-----------+---------------+-------------+ RIGHT     CompressibilityPhasicitySpontaneityProperties     Thrombus                                                                 Aging         +---------+---------------+---------+-----------+---------------+-------------+ CFV      Full           Yes      No         pulsatile                                                                waveform                     +---------+---------------+---------+-----------+---------------+-------------+  SFJ      Full                                                            +---------+---------------+---------+-----------+---------------+-------------+ FV Prox  Full                                                            +---------+---------------+---------+-----------+---------------+-------------+ FV Mid   Full                                                            +---------+---------------+---------+-----------+---------------+-------------+ FV DistalFull                                                            +---------+---------------+---------+-----------+---------------+-------------+ PFV      Full                                                            +---------+---------------+---------+-----------+---------------+-------------+ POP      Full           Yes      No         pulsatile                                                                waveform                     +---------+---------------+---------+-----------+---------------+-------------+ PTV      Full                                                            +---------+---------------+---------+-----------+---------------+-------------+ PERO     Full                                                            +---------+---------------+---------+-----------+---------------+-------------+ Gastroc  Full                                                             +---------+---------------+---------+-----------+---------------+-------------+   +--------+---------------+---------+-----------+----------------+-------------+  LEFT    CompressibilityPhasicitySpontaneityProperties      Thrombus                                                                 Aging         +--------+---------------+---------+-----------+----------------+-------------+ CFV     Full           Yes      No         pulsatile                                                                waveform                      +--------+---------------+---------+-----------+----------------+-------------+ SFJ     Full                                                             +--------+---------------+---------+-----------+----------------+-------------+ FV Prox Full                                                             +--------+---------------+---------+-----------+----------------+-------------+ FV Mid  Full                                                             +--------+---------------+---------+-----------+----------------+-------------+ FV      Full                                                             Distal                                                                   +--------+---------------+---------+-----------+----------------+-------------+ PFV     Full                                                             +--------+---------------+---------+-----------+----------------+-------------+  POP     Full           Yes      Yes                                      +--------+---------------+---------+-----------+----------------+-------------+ PTV     Full                                                             +--------+---------------+---------+-----------+----------------+-------------+ PERO    Full                                                              +--------+---------------+---------+-----------+----------------+-------------+     Summary: RIGHT: - There is no evidence of deep vein thrombosis in the lower extremity.  - No cystic structure found in the popliteal fossa. pulsatile waveforms noted  LEFT: - There is no evidence of deep vein thrombosis in the lower extremity.  - No cystic structure found in the popliteal fossa. Pulsatile waveforms noted.  *See table(s) above for measurements and observations. Electronically signed by Fonda Rim on 08/15/2023 at 10:32:44 AM.    Final    DG CHEST PORT 1 VIEW Result Date: 08/14/2023 CLINICAL DATA:  [id number] Preoperative cardiovascular examination [id number] EXAM: PORTABLE CHEST 1 VIEW COMPARISON:  12/31/2014 FINDINGS: Single frontal view of the chest demonstrates an unremarkable cardiac silhouette. No acute airspace disease, effusion, or pneumothorax. No acute bony abnormalities. IMPRESSION: 1. No acute intrathoracic process. Electronically Signed   By: Ozell Daring M.D.   On: 08/14/2023 15:13   CT ABDOMEN PELVIS W CONTRAST Result Date: 08/14/2023 CLINICAL DATA:  Right side abdominal pain EXAM: CT ABDOMEN AND PELVIS WITH CONTRAST TECHNIQUE: Multidetector CT imaging of the abdomen and pelvis was performed using the standard protocol following bolus administration of intravenous contrast. RADIATION DOSE REDUCTION: This exam was performed according to the departmental dose-optimization program which includes automated exposure control, adjustment of the mA and/or kV according to patient size and/or use of iterative reconstruction technique. CONTRAST:  OMNIPAQUE  IOHEXOL  350 MG/ML SOLN COMPARISON:  11/19/2010 FINDINGS: Lower chest: Dependent atelectasis in the lower lobes. No effusions. Hepatobiliary: Gallbladder is distended with wall thickening. There may be stones within the fundus, but difficult to visualize. No biliary ductal dilatation or focal hepatic abnormality. Nodular contours of the liver surface  compatible with cirrhosis. Recanalized umbilical vein. Pancreas: No focal abnormality or ductal dilatation. Spleen: No focal abnormality.  Normal size. Adrenals/Urinary Tract: No adrenal abnormality. No focal renal abnormality. No stones or hydronephrosis. Urinary bladder is unremarkable. Stomach/Bowel: Normal appendix. Stomach, large and small bowel grossly unremarkable. Vascular/Lymphatic: Aortic atherosclerosis. No evidence of aneurysm or adenopathy. Reproductive: Uterus and adnexa unremarkable.  No mass. Other: No free fluid or free air. Musculoskeletal: No acute bony abnormality. IMPRESSION: Distended gallbladder with wall thickening and surrounding inflammation. Appearance is concerning for acute cholecystitis. Changes of cirrhosis with recanalized umbilical vein. Electronically Signed   By: Franky Crease M.D.   On: 08/14/2023 01:38  Scheduled Meds:  docusate sodium   100 mg Oral BID   enoxaparin  (LOVENOX ) injection  40 mg Subcutaneous Q24H   polyethylene glycol  17 g Oral BID   sodium chloride  flush  3 mL Intravenous Q12H   Continuous Infusions:  cefTRIAXone  (ROCEPHIN )  IV 2 g (08/15/23 0008)   metronidazole  500 mg (08/15/23 1024)     LOS: 1 day    Time spent: 35 minutes    Leatrice Chapel, MD  [facility] Pager #: [phone] 7PM-7AM contact night coverage as above

## 2023-08-15 NOTE — Plan of Care (Signed)

## 2023-08-15 NOTE — Progress Notes (Signed)
 Progress Note  1 Day Post-Op  Subjective: Pt reports she still has some RUQ pain but it is improved from yesterday. Tolerating CLD and denies nausea or vomiting. No BM.   Objective: Vital signs in last 24 hours: Temp:  [98 F (36.7 C)-98.9 F (37.2 C)] 98.1 F (36.7 C) (02/09 0732) Pulse Rate:  [69-79] 69 (02/09 0732) Resp:  [16-18] 16 (02/09 0732) BP: (114-127)/(73-78) 122/74 (02/09 0732) SpO2:  [90 %-95 %] 95 % (02/09 0732) Last BM Date : 08/13/23 (per pt)  Intake/Output from previous day: 02/08 0701 - 02/09 0700 In: 200 [IV Piggyback:200] Out: -  Intake/Output this shift: No intake/output data recorded.  PE: General: pleasant, WD, WN female who is laying in bed in NAD HEENT: sclera anicteric Lungs: Respiratory effort nonlabored Abd: soft, mild ttp in RUQ, minimal distention  Psych: A&Ox3 with an appropriate affect.    Lab Results:  Recent Labs    08/14/23 1025 08/15/23 0057  WBC 10.3 7.7  HGB 13.5 12.6  HCT 38.6 36.8  PLT 350 329   BMET Recent Labs    08/13/23 2344 08/14/23 1025 08/15/23 0057  NA 134*  --  137  K 3.2*  --  4.0  CL 94*  --  104  CO2 29  --  24  GLUCOSE 148*  --  88  BUN 10  --  5*  CREATININE 0.67 0.77 0.66  CALCIUM 9.6  --  8.5*   PT/INR Recent Labs    08/15/23 0057  LABPROT 16.5*  INR 1.3*   CMP     Component Value Date/Time   NA 137 08/15/2023 0057   K 4.0 08/15/2023 0057   CL 104 08/15/2023 0057   CO2 24 08/15/2023 0057   GLUCOSE 88 08/15/2023 0057   BUN 5 (L) 08/15/2023 0057   CREATININE 0.66 08/15/2023 0057   CALCIUM 8.5 (L) 08/15/2023 0057   PROT 6.5 08/14/2023 0625   ALBUMIN 3.4 (L) 08/14/2023 0625   AST 28 08/14/2023 0625   ALT 16 08/14/2023 0625   ALKPHOS 114 08/14/2023 0625   BILITOT 1.4 (H) 08/14/2023 0625   GFRNONAA >60 08/15/2023 0057   GFRAA 55 (L) 12/31/2014 2339   Lipase     Component Value Date/Time   LIPASE 52 (H) 08/13/2023 2344       Studies/Results: DG CHEST PORT 1 VIEW Result  Date: 08/14/2023 CLINICAL DATA:  [id number] Preoperative cardiovascular examination [id number] EXAM: PORTABLE CHEST 1 VIEW COMPARISON:  12/31/2014 FINDINGS: Single frontal view of the chest demonstrates an unremarkable cardiac silhouette. No acute airspace disease, effusion, or pneumothorax. No acute bony abnormalities. IMPRESSION: 1. No acute intrathoracic process. Electronically Signed   By: Ozell Daring M.D.   On: 08/14/2023 15:13   CT ABDOMEN PELVIS W CONTRAST Result Date: 08/14/2023 CLINICAL DATA:  Right side abdominal pain EXAM: CT ABDOMEN AND PELVIS WITH CONTRAST TECHNIQUE: Multidetector CT imaging of the abdomen and pelvis was performed using the standard protocol following bolus administration of intravenous contrast. RADIATION DOSE REDUCTION: This exam was performed according to the departmental dose-optimization program which includes automated exposure control, adjustment of the mA and/or kV according to patient size and/or use of iterative reconstruction technique. CONTRAST:  OMNIPAQUE  IOHEXOL  350 MG/ML SOLN COMPARISON:  11/19/2010 FINDINGS: Lower chest: Dependent atelectasis in the lower lobes. No effusions. Hepatobiliary: Gallbladder is distended with wall thickening. There may be stones within the fundus, but difficult to visualize. No biliary ductal dilatation or focal hepatic abnormality. Nodular contours of the liver  surface compatible with cirrhosis. Recanalized umbilical vein. Pancreas: No focal abnormality or ductal dilatation. Spleen: No focal abnormality.  Normal size. Adrenals/Urinary Tract: No adrenal abnormality. No focal renal abnormality. No stones or hydronephrosis. Urinary bladder is unremarkable. Stomach/Bowel: Normal appendix. Stomach, large and small bowel grossly unremarkable. Vascular/Lymphatic: Aortic atherosclerosis. No evidence of aneurysm or adenopathy. Reproductive: Uterus and adnexa unremarkable.  No mass. Other: No free fluid or free air. Musculoskeletal: No acute bony  abnormality. IMPRESSION: Distended gallbladder with wall thickening and surrounding inflammation. Appearance is concerning for acute cholecystitis. Changes of cirrhosis with recanalized umbilical vein. Electronically Signed   By: Franky Crease M.D.   On: 08/14/2023 01:38    Anti-infectives: Anti-infectives (From admission, onward)    Start     Dose/Rate Route Frequency Ordered Stop   08/15/23 0000  cefTRIAXone  (ROCEPHIN ) 2 g in sodium chloride  0.9 % 100 mL IVPB        2 g 200 mL/hr over 30 Minutes Intravenous Every 24 hours 08/14/23 0947     08/14/23 1100  metroNIDAZOLE  (FLAGYL ) IVPB 500 mg        500 mg 100 mL/hr over 60 Minutes Intravenous Every 12 hours 08/14/23 0947     08/14/23 0300  cefTRIAXone  (ROCEPHIN ) 2 g in sodium chloride  0.9 % 100 mL IVPB        2 g 200 mL/hr over 30 Minutes Intravenous  Once 08/14/23 0247 08/14/23 0333   08/14/23 0300  metroNIDAZOLE  (FLAGYL ) IVPB 500 mg        500 mg 100 mL/hr over 60 Minutes Intravenous  Once 08/14/23 0247 08/14/23 0500        Assessment/Plan  Cirrhosis of the liver RUQ abdominal pain ?acalculous cholecystitis   ?Constipation - CT AP 2/8 with distended gallbladder with some wall thickening and surrounding inflammation, changes of cirrhosis with recanalized umbilical vein; I reviewed imaging myself and also note some scattered stool throughout large bowel and patient reports new constipation over the last week - no gallstones on any prior imaging, gets US  biannually  - LFTs not checked this AM - WBC 7.7, afebrile and HD stable - still having some RUQ pain but improving, tolerating CLD - I think reasonable to continue abx for now and advance to [facility] diet. Repeat labs in AM, if she continues to improve seems reasonable to DC home on abx, if worsening or unable to tolerate diet advancement would recommend IR consult for percutaneous cholecystostomy    FEN: HH diet, IVF per primary  VTE: LMWH ID: rocephin /flagyl     - per TR[facility] -   HTN GERD Hx of bladder cancer  Asthma Psoriasis    LOS: 1 day   I reviewed hospitalist notes, last 24 h vitals and pain scores, last 48 h intake and output, last 24 h labs and trends, and last 24 h imaging results.  This care required moderate level of medical decision making.    Burnard JONELLE Louder, [facility] 08/15/2023, 9:01 AM Please see Amion for pager number during day hours 7:00am-4:30pm

## 2023-08-16 ENCOUNTER — Other Ambulatory Visit: Payer: Self-pay

## 2023-08-16 ENCOUNTER — Other Ambulatory Visit (HOSPITAL_BASED_OUTPATIENT_CLINIC_OR_DEPARTMENT_OTHER): Payer: Self-pay

## 2023-08-16 DIAGNOSIS — K7469 Other cirrhosis of liver: Secondary | ICD-10-CM | POA: Diagnosis not present

## 2023-08-16 DIAGNOSIS — R1011 Right upper quadrant pain: Secondary | ICD-10-CM | POA: Diagnosis not present

## 2023-08-16 DIAGNOSIS — K810 Acute cholecystitis: Secondary | ICD-10-CM | POA: Diagnosis not present

## 2023-08-16 LAB — COMPREHENSIVE METABOLIC PANEL
ALT: 20 U/L (ref 0–44)
AST: 23 U/L (ref 15–41)
Albumin: 2.8 g/dL — ABNORMAL LOW (ref 3.5–5.0)
Alkaline Phosphatase: 104 U/L (ref 38–126)
Anion gap: 16 — ABNORMAL HIGH (ref 5–15)
BUN: <5 mg/dL — ABNORMAL LOW (ref 8–23)
CO2: 22 mmol/L (ref 22–32)
Calcium: 9.3 mg/dL (ref 8.9–10.3)
Chloride: 101 mmol/L (ref 98–111)
Creatinine, Ser: 0.65 mg/dL (ref 0.44–1.00)
GFR, Estimated: >60 mL/min (ref >60)
Glucose, Bld: 124 mg/dL — ABNORMAL HIGH (ref 70–99)
Potassium: 4.1 mmol/L (ref 3.5–5.1)
Sodium: 139 mmol/L (ref 135–145)
Total Bilirubin: 0.6 mg/dL (ref 0.0–1.2)
Total Protein: 6.2 g/dL — ABNORMAL LOW (ref 6.5–8.1)

## 2023-08-16 LAB — CBC WITH DIFFERENTIAL/PLATELET
Abs Immature Granulocytes: 0 10*3/uL (ref 0.00–0.07)
Basophils Absolute: 0.5 10*3/uL — ABNORMAL HIGH (ref 0.0–0.1)
Basophils Relative: 5 %
Eosinophils Absolute: 0.3 10*3/uL (ref 0.0–0.5)
Eosinophils Relative: 3 %
HCT: 38.5 % (ref 36.0–46.0)
Hemoglobin: 13.1 g/dL (ref 12.0–15.0)
Lymphocytes Relative: 26 %
Lymphs Abs: 2.4 10*3/uL (ref 0.7–4.0)
MCH: 32.3 pg (ref 26.0–34.0)
MCHC: 34 g/dL (ref 30.0–36.0)
MCV: 94.8 fL (ref 80.0–100.0)
Monocytes Absolute: 0.5 10*3/uL (ref 0.1–1.0)
Monocytes Relative: 5 %
Neutro Abs: 5.6 10*3/uL (ref 1.7–7.7)
Neutrophils Relative %: 61 %
Platelets: 386 10*3/uL (ref 150–400)
RBC: 4.06 MIL/uL (ref 3.87–5.11)
RDW: 12.7 % (ref 11.5–15.5)
WBC: 9.2 10*3/uL (ref 4.0–10.5)
nRBC: 0 % (ref 0.0–0.2)
nRBC: 0 /100{WBCs}

## 2023-08-16 LAB — MAGNESIUM: Magnesium: 1.9 mg/dL (ref 1.7–2.4)

## 2023-08-16 LAB — PHOSPHORUS: Phosphorus: 3.3 mg/dL (ref 2.5–4.6)

## 2023-08-16 MED ORDER — DOCUSATE SODIUM 100 MG PO CAPS
100.0000 mg | ORAL_CAPSULE | Freq: Two times a day (BID) | ORAL | 0 refills | Status: DC
  Filled 2023-08-16: qty 10, 5d supply, fill #0

## 2023-08-16 MED ORDER — METRONIDAZOLE 500 MG PO TABS
500.0000 mg | ORAL_TABLET | Freq: Two times a day (BID) | ORAL | 0 refills | Status: AC
  Filled 2023-08-16: qty 14, 7d supply, fill #0

## 2023-08-16 MED ORDER — POLYETHYLENE GLYCOL 3350 17 G PO PACK
17.0000 g | PACK | Freq: Two times a day (BID) | ORAL | 0 refills | Status: DC
  Filled 2023-08-16: qty 14, 7d supply, fill #0

## 2023-08-16 MED ORDER — FUROSEMIDE 20 MG PO TABS
20.0000 mg | ORAL_TABLET | Freq: Every day | ORAL | 0 refills | Status: DC
  Filled 2023-08-16: qty 30, 30d supply, fill #0

## 2023-08-16 MED ORDER — SPIRONOLACTONE 25 MG PO TABS
25.0000 mg | ORAL_TABLET | Freq: Every day | ORAL | 1 refills | Status: DC
  Filled 2023-08-16: qty 30, 30d supply, fill #0
  Filled 2023-09-10: qty 30, 30d supply, fill #1

## 2023-08-16 MED ORDER — AMOXICILLIN-POT CLAVULANATE 875-125 MG PO TABS
1.0000 | ORAL_TABLET | Freq: Two times a day (BID) | ORAL | 0 refills | Status: AC
  Filled 2023-08-16: qty 14, 7d supply, fill #0

## 2023-08-16 NOTE — TOC Transition Note (Signed)
 Transition of Care [facility]) - Discharge Note   Patient Details  Name: [name] [name] MRN: [id number] Date of Birth: 26-May-1956  [facility] CM/SW Contact:  Jeani Mill, RN Phone Number: 08/16/2023, 1:07 PM   Clinical Narrative:    Patient stable to discharge home.  No TOC needs at this time.    Final next level of care: Home/Self Care Barriers to Discharge: Barriers Resolved   Patient Goals and CMS Choice Patient states their goals for this hospitalization and ongoing recovery are:: return home          Discharge Placement             home          Discharge Plan and Services Additional resources added to the After Visit Summary for                                       Social Drivers of Health (SDOH) Interventions SDOH Screenings   Food Insecurity: No Food Insecurity (08/14/2023)  Housing: Low Risk  (08/14/2023)  Transportation Needs: No Transportation Needs (08/14/2023)  Utilities: Not At Risk (08/14/2023)  Depression (PHQ2-9): Low Risk  (08/07/2022)  Financial Resource Strain: Low Risk  (07/30/2023)  Physical Activity: Sufficiently Active (07/30/2023)  Social Connections: Socially Integrated (08/14/2023)  Stress: Stress Concern Present (07/30/2023)  Tobacco Use: Low Risk  (08/13/2023)     Readmission Risk Interventions    08/16/2023    1:07 PM  Readmission Risk Prevention Plan  Post Dischage Appt Complete  Medication Screening Complete  Transportation Screening Complete

## 2023-08-16 NOTE — Plan of Care (Signed)
 Discharge instructions discussed with patient.  Patient instructed on home medications, restrictions, and follow up appointments. Belongings gathered and sent with patient.  Patients medications sent to her preferred pharmacy.  Patient discharged via wheelchair by this Clinical research associate.

## 2023-08-16 NOTE — Progress Notes (Signed)
 Subjective/Chief Complaint: Pt with no abd pain Tol PO well   Objective: Vital signs in last 24 hours: Temp:  [97.4 F (36.3 C)-98.3 F (36.8 C)] 97.9 F (36.6 C) (02/10 0821) Pulse Rate:  [70-78] 72 (02/10 0821) Resp:  [16-18] 16 (02/10 0821) BP: (124-140)/(79-88) 124/82 (02/10 0821) SpO2:  [94 %-99 %] 99 % (02/10 0821) Last BM Date : 08/13/23 (per pt)  Intake/Output from previous day: 02/09 0701 - 02/10 0700 In: 720 [P.O.:720] Out: -  Intake/Output this shift: No intake/output data recorded.  PE:  Constitutional: No acute distress, conversant, appears states age. Eyes: Anicteric sclerae, moist conjunctiva, no lid lag Lungs: Clear to auscultation bilaterally, normal respiratory effort CV: regular rate and rhythm, no murmurs, no peripheral edema, pedal pulses 2+ GI: Soft, no masses or hepatosplenomegaly, non-tender to palpation Skin: No rashes, palpation reveals normal turgor Psychiatric: appropriate judgment and insight, oriented to person, place, and time   Lab Results:  Recent Labs    08/15/23 0057 08/16/23 0432  WBC 7.7 9.2  HGB 12.6 13.1  HCT 36.8 38.5  PLT 329 386   BMET Recent Labs    08/15/23 0057 08/16/23 0432  NA 137 139  K 4.0 4.1  CL 104 101  CO2 24 22  GLUCOSE 88 124*  BUN 5* <5*  CREATININE 0.66 0.65  CALCIUM 8.5* 9.3   PT/INR Recent Labs    08/15/23 0057  LABPROT 16.5*  INR 1.3*   ABG No results for input(s): "PHART", "HCO3" in the last 72 hours.  Invalid input(s): "PCO2", "PO2"  Studies/Results: VAS US  LOWER EXTREMITY VENOUS (DVT) Result Date: 08/15/2023  Lower Venous DVT Study Patient Name:  [name] [name]  Date of Exam:   08/15/2023 Medical Rec #: [id number]          Accession #:    [id number] Date of Birth: 08-15-1955          Patient Gender: F Patient Age:   68 years Exam Location:  [facility] Procedure:      VAS US  LOWER EXTREMITY VENOUS (DVT) Referring Phys: [facility] GOEL  --------------------------------------------------------------------------------  Indications: Right upper quadrant pain.  Comparison Study: No prior study on file Performing Technologist: Carleene Chase RVS  Examination Guidelines: A complete evaluation includes B-mode imaging, spectral Doppler, color Doppler, and power Doppler as needed of all accessible portions of each vessel. Bilateral testing is considered an integral part of a complete examination. Limited examinations for reoccurring indications may be performed as noted. The reflux portion of the exam is performed with the patient in reverse Trendelenburg.  +---------+---------------+---------+-----------+---------------+-------------+ RIGHT    CompressibilityPhasicitySpontaneityProperties     Thrombus                                                                 Aging         +---------+---------------+---------+-----------+---------------+-------------+ CFV      Full           Yes      No         pulsatile  waveform                     +---------+---------------+---------+-----------+---------------+-------------+ SFJ      Full                                                            +---------+---------------+---------+-----------+---------------+-------------+ FV Prox  Full                                                            +---------+---------------+---------+-----------+---------------+-------------+ FV Mid   Full                                                            +---------+---------------+---------+-----------+---------------+-------------+ FV DistalFull                                                            +---------+---------------+---------+-----------+---------------+-------------+ PFV      Full                                                             +---------+---------------+---------+-----------+---------------+-------------+ POP      Full           Yes      No         pulsatile                                                                waveform                     +---------+---------------+---------+-----------+---------------+-------------+ PTV      Full                                                            +---------+---------------+---------+-----------+---------------+-------------+ PERO     Full                                                            +---------+---------------+---------+-----------+---------------+-------------+ Gastroc  Full                                                            +---------+---------------+---------+-----------+---------------+-------------+   +--------+---------------+---------+-----------+----------------+-------------+ LEFT    CompressibilityPhasicitySpontaneityProperties      Thrombus                                                                 Aging         +--------+---------------+---------+-----------+----------------+-------------+ CFV     Full           Yes      No         pulsatile                                                                waveform                      +--------+---------------+---------+-----------+----------------+-------------+ SFJ     Full                                                             +--------+---------------+---------+-----------+----------------+-------------+ FV Prox Full                                                             +--------+---------------+---------+-----------+----------------+-------------+ FV Mid  Full                                                             +--------+---------------+---------+-----------+----------------+-------------+ FV      Full                                                             Distal                                                                    +--------+---------------+---------+-----------+----------------+-------------+ PFV  Full                                                             +--------+---------------+---------+-----------+----------------+-------------+ POP     Full           Yes      Yes                                      +--------+---------------+---------+-----------+----------------+-------------+ PTV     Full                                                             +--------+---------------+---------+-----------+----------------+-------------+ PERO    Full                                                             +--------+---------------+---------+-----------+----------------+-------------+     Summary: RIGHT: - There is no evidence of deep vein thrombosis in the lower extremity.  - No cystic structure found in the popliteal fossa. pulsatile waveforms noted  LEFT: - There is no evidence of deep vein thrombosis in the lower extremity.  - No cystic structure found in the popliteal fossa. Pulsatile waveforms noted.  *See table(s) above for measurements and observations. Electronically signed by Irvin Mantel on 08/15/2023 at 10:32:44 AM.    Final     Anti-infectives: Anti-infectives (From admission, onward)    Start     Dose/Rate Route Frequency Ordered Stop   08/16/23 0000  amoxicillin -clavulanate (AUGMENTIN ) 875-125 MG tablet        1 tablet Oral 2 times daily 08/16/23 1246 08/23/23 2359   08/16/23 0000  metroNIDAZOLE  (FLAGYL ) 500 MG tablet        500 mg Oral 2 times daily 08/16/23 1246 08/23/23 2359   08/15/23 0000  cefTRIAXone  (ROCEPHIN ) 2 g in sodium chloride  0.9 % 100 mL IVPB        2 g 200 mL/hr over 30 Minutes Intravenous Every 24 hours 08/14/23 0947     08/14/23 1100  metroNIDAZOLE  (FLAGYL ) IVPB 500 mg        500 mg 100 mL/hr over 60 Minutes Intravenous Every 12 hours 08/14/23 0947     08/14/23 0300  cefTRIAXone  (ROCEPHIN ) 2  g in sodium chloride  0.9 % 100 mL IVPB        2 g 200 mL/hr over 30 Minutes Intravenous  Once 08/14/23 0247 08/14/23 0333   08/14/23 0300  metroNIDAZOLE  (FLAGYL ) IVPB 500 mg        500 mg 100 mL/hr over 60 Minutes Intravenous  Once 08/14/23 0247 08/14/23 0500       Assessment/Plan: Cirrhosis of the liver RUQ abdominal pain   ?Constipation -Pt tol PO well without abd pain -OK for home per surgery  FEN: HH diet, IVF per primary  VTE: LMWH ID: rocephin /flagyl    LOS: 1  day    Shela Derby 08/16/2023

## 2023-08-16 NOTE — Discharge Summary (Signed)
 Physician Discharge Summary  Patient ID: [name] [name] MRN: [id number] DOB/AGE: 09/17/1955 68 y.o.  Admit date: 08/14/2023 Discharge date: 08/16/2023  Admission Diagnoses:  Discharge Diagnoses:  Principal Problem:   Acute acalculous cholecystitis Active Problems:   Atelectasis   Cirrhosis (HCC)   Abdominal pain   Discharged Condition: stable  Hospital Course:  Patient is a 68 year old female past medical history significant for bladder cancer, alcoholic liver cirrhosis, esophageal varices, GERD, hypertension, psoriasis and hyponatremia.  Patient was admitted with concerns for possible acute acalculous cholecystitis.  Patient was admitted and managed conservatively.  During the hospital stay, patient was managed with IV Flagyl and ceftriaxone.  Surgery team was consulted to assist with patient's management (please see the documentation by the surgery team).  Patient symptoms have resolved.  Surgical team has cleared patient for discharge.  Patient will be discharged back home on oral antibiotics.  Follow-up with primary care provider and surgery team on discharge.    Acute acalculous cholecystitis: -Manage with IV Rocephin and Flagyl during the hospital stay. -Symptoms have improved significantly. -No abdominal pain or other constitutional symptoms endorsed. -CT report is as documented below.   -Surgery team was consulted to assist with patient's management. -Patient is currently being managed conservatively. -Patient has been cleared for discharge by the surgical team. -Patient will be discharged on oral antibiotics. -Patient will follow-up with primary care provider and surgery team on discharge.   Liver cirrhosis: -Stable. -Follow-up with GI team on discharge.   Atelectasis: -Incentive spirometry.  Consults: general surgery As per surgery team: "Attestation signed by Fritzi Mandes, MD at 08/15/2023  7:48 AM   I personally saw the patient and performed a substantive  portion of the medical decision making, in conjunction with the [facility] provider Trixie Deis, PA-C for the treatment of RUQ pain, cholecystitis. Patient has known cirrhosis, compensated and stable for many years. Symptoms are consistent with cholecystitis. CT shows signs of portal hypertension, with recanalization of the umbilical vein. I discussed with the patient that the risks of bleeding from surgery are significantly increased in the setting of portal hypertension, thus I would recommend nonop management for now. She has no stones on previous ultrasounds or current CT. Treat with antibiotics, will assess response.   I reviewed recent lab values, vitals, and notes.   This care required high level of medical decision making".  Significant Diagnostic Studies:  CT ABDOMEN AND PELVIS WITH CONTRAST:   TECHNIQUE: Multidetector CT imaging of the abdomen and pelvis was performed using the standard protocol following bolus administration of intravenous contrast.   RADIATION DOSE REDUCTION: This exam was performed according to the departmental dose-optimization program which includes automated exposure control, adjustment of the mA and/or kV according to patient size and/or use of iterative reconstruction technique.   CONTRAST:  OMNIPAQUE IOHEXOL 350 MG/ML SOLN   COMPARISON:  11/19/2010   FINDINGS: Lower chest: Dependent atelectasis in the lower lobes. No effusions.   Hepatobiliary: Gallbladder is distended with wall thickening. There may be stones within the fundus, but difficult to visualize. No biliary ductal dilatation or focal hepatic abnormality. Nodular contours of the liver surface compatible with cirrhosis. Recanalized umbilical vein.   Pancreas: No focal abnormality or ductal dilatation.   Spleen: No focal abnormality.  Normal size.   Adrenals/Urinary Tract: No adrenal abnormality. No focal renal abnormality. No stones or hydronephrosis. Urinary bladder  is unremarkable.   Stomach/Bowel: Normal appendix. Stomach, large and small bowel grossly unremarkable.   Vascular/Lymphatic: Aortic atherosclerosis.  No evidence of aneurysm or adenopathy.   Reproductive: Uterus and adnexa unremarkable.  No mass.   Other: No free fluid or free air.   Musculoskeletal: No acute bony abnormality.   IMPRESSION: Distended gallbladder with wall thickening and surrounding inflammation. Appearance is concerning for acute cholecystitis.   Changes of cirrhosis with recanalized umbilical vein.     Electronically Signed   By: Charlett Nose M.D.   On: 08/14/2023 01:38    PORTABLE CHEST 1 VIEW:   COMPARISON:  12/31/2014   FINDINGS: Single frontal view of the chest demonstrates an unremarkable cardiac silhouette. No acute airspace disease, effusion, or pneumothorax. No acute bony abnormalities.   IMPRESSION: 1. No acute intrathoracic process.     Electronically Signed   By: Sharlet Salina M.D.   On: 08/14/2023 15:13    D-Dimer: 3.78 INR : 1.3 UA: Small Hemoglobin and rare bacteria    Discharge Exam: Blood pressure 124/82, pulse 72, temperature 97.9 F (36.6 C), temperature source Oral, resp. rate 16, height 5\' 3"  (1.6 m), weight 61.2 kg, SpO2 99%.   Disposition: Discharge disposition: 01-Home or Self Care       Discharge Instructions     Diet - low sodium heart healthy   Complete by: As directed    Increase activity slowly   Complete by: As directed       Allergies as of 08/16/2023       Reactions   Lisinopril    REACTION: unspecified   Sulfamethoxazole-trimethoprim    REACTION: unspecified        Medication List     STOP taking these medications    amLODipine 5 MG tablet Commonly known as: NORVASC   LUTEIN-ZEAXANTHIN PO   PROBIOTIC DAILY PO       TAKE these medications    albuterol 108 (90 Base) MCG/ACT inhaler Commonly known as: VENTOLIN HFA Inhale 2 puffs into the lungs every 4 (four) hours as  needed. What changed: reasons to take this   amoxicillin-clavulanate 875-125 MG tablet Commonly known as: AUGMENTIN Take 1 tablet by mouth 2 (two) times daily for 7 days.   b complex vitamins tablet Take 1 tablet by mouth daily.   docusate sodium 100 MG capsule Commonly known as: COLACE Take 1 capsule (100 mg total) by mouth 2 (two) times daily.   fluticasone 50 MCG/ACT nasal spray Commonly known as: FLONASE Place 2 sprays into both nostrils daily.   furosemide 20 MG tablet Commonly known as: LASIX Take 1 tablet (20 mg total) by mouth daily. What changed:  medication strength how much to take   metroNIDAZOLE 500 MG tablet Commonly known as: Flagyl Take 1 tablet (500 mg total) by mouth 2 (two) times daily for 7 days.   omeprazole 20 MG tablet Commonly known as: PRILOSEC OTC Take 20 mg by mouth daily.   polyethylene glycol 17 g packet Commonly known as: MIRALAX / GLYCOLAX Take 17 g by mouth 2 (two) times daily.   Pre-Natal Formula Tabs Take 1 tablet by mouth daily.   spironolactone 25 MG tablet Commonly known as: ALDACTONE Take 1 tablet (25 mg total) by mouth daily. What changed:  medication strength how much to take        Time spent: 35 Minutes  Signed: Barnetta Chapel 08/16/2023, 12:46 PM

## 2023-08-17 ENCOUNTER — Other Ambulatory Visit (HOSPITAL_COMMUNITY): Payer: Self-pay

## 2023-08-19 ENCOUNTER — Encounter: Payer: Self-pay | Admitting: Internal Medicine

## 2023-08-23 ENCOUNTER — Ambulatory Visit: Payer: Commercial Managed Care - PPO | Admitting: Licensed Clinical Social Worker

## 2023-08-23 DIAGNOSIS — F411 Generalized anxiety disorder: Secondary | ICD-10-CM | POA: Diagnosis not present

## 2023-08-23 NOTE — Progress Notes (Signed)
[facility] Counselor/Therapist Progress Note  Patient ID: [name] [name], MRN: [id number]    Date: 08/23/23  Time Spent: 0802 am - 0900 am : 58 Minutes  Treatment Type: Individual Therapy.  Reported Symptoms: Anxiety, stress, excessive worry   Mental Status Exam: Appearance:  Well Groomed     Behavior: Appropriate  Motor: Normal  Speech/Language:  Clear and Coherent  Affect: Appropriate  Mood: normal  Thought process: normal  Thought content:   WNL  Sensory/Perceptual disturbances:   WNL  Orientation: oriented to person, place, time/date, situation, day of week, month of year, and year  Attention: Good  Concentration: Good  Memory: WNL  Fund of knowledge:  Good  Insight:   Good  Judgment:  Good  Impulse Control: Good    Risk Assessment: Danger to Self:  No Self-injurious Behavior: No Danger to Others: No Duty to Warn:no Physical Aggression / Violence:No  Access to Firearms a concern: No  Gang Involvement:No    Subjective:    [name] [name] participated from office, located at [facility] with Clinician present. Thaily  consented to treatment.    [name] [name] presented for her session very emotional. Patient reports that her husband is not doing well and is going to be in palliative care. Patient reports that he has had a significant decline according to his medical team. Patient shared that she had reached out to Olean Ree to receive some guidance on how to proceed. Patient reports that she was hospitalized a week ago for her gallbladder. She reports that they don't wan to remove it due to risks. She stats she feels the pain. Patient reports feeling overwhelmed.  Patient was very emotional during the session. Patient appears to be overwhelmed and tired as evidenced by her verbal feedback and physical health. Patient has a good support with her friends but doesn't have any close family that can provide support other than phone conversations. Clinician  processed with patient the importance of allowing people to help her and not being afraid to ask for help. Clinician encouraged patient to make self-care a priority in order to be able to be there for her husband.   Patient identified that she has found a housekeeper to assist with housework and had someone come in and fix her stairs. She reports that she bought some box meals and will work on eating better, although she states nothing taste good to her. Patient agreed to take one day at a time and ask for help from others when needed. Patient agreed to practice active listening, encourage open communication, engage in relaxation techniques like deep breathing or meditation, seek professional help to assist with decision making for her spouse when necessary, educate herself about his condition, set boundaries for worry time, maintain a healthy lifestyle, and prioritize quality time with spouse while respecting their needs and feelings. Patient will continue with bi weekly therapy. Treatment plan to be reviewed by 01/06/2024.     Interventions: Cognitive Behavioral Therapy   Diagnosis: Generalized Anxiety Disorder    [name] MSW, LCSW/DATE 08/23/2023

## 2023-08-27 ENCOUNTER — Ambulatory Visit: Payer: Commercial Managed Care - PPO | Admitting: Internal Medicine

## 2023-08-27 ENCOUNTER — Encounter: Payer: Self-pay | Admitting: Internal Medicine

## 2023-08-27 VITALS — BP 160/100 | HR 72 | Temp 97.6°F | Ht 63.0 in | Wt 134.0 lb

## 2023-08-27 DIAGNOSIS — K7460 Unspecified cirrhosis of liver: Secondary | ICD-10-CM | POA: Diagnosis not present

## 2023-08-27 DIAGNOSIS — K810 Acute cholecystitis: Secondary | ICD-10-CM

## 2023-08-27 LAB — COMPREHENSIVE METABOLIC PANEL
ALT: 17 U/L (ref 0–35)
AST: 27 U/L (ref 0–37)
Albumin: 4.3 g/dL (ref 3.5–5.2)
Alkaline Phosphatase: 86 U/L (ref 39–117)
BUN: 6 mg/dL (ref 6–23)
CO2: 31 meq/L (ref 19–32)
Calcium: 9.6 mg/dL (ref 8.4–10.5)
Chloride: 101 meq/L (ref 96–112)
Creatinine, Ser: 0.67 mg/dL (ref 0.40–1.20)
GFR: 89.6 mL/min (ref >60.00)
Glucose, Bld: 103 mg/dL — ABNORMAL HIGH (ref 70–99)
Potassium: 3.6 meq/L (ref 3.5–5.1)
Sodium: 143 meq/L (ref 135–145)
Total Bilirubin: 1 mg/dL (ref 0.2–1.2)
Total Protein: 7.5 g/dL (ref 6.0–8.3)

## 2023-08-27 LAB — CBC WITH DIFFERENTIAL/PLATELET
Basophils Absolute: 0.1 10*3/uL (ref 0.0–0.1)
Basophils Relative: 1.1 % (ref 0.0–3.0)
Eosinophils Absolute: 0 10*3/uL (ref 0.0–0.7)
Eosinophils Relative: 0.5 % (ref 0.0–5.0)
HCT: 44 % (ref 36.0–46.0)
Hemoglobin: 14.8 g/dL (ref 12.0–15.0)
Lymphocytes Relative: 21.2 % (ref 12.0–46.0)
Lymphs Abs: 2 10*3/uL (ref 0.7–4.0)
MCHC: 33.5 g/dL (ref 30.0–36.0)
MCV: 97.2 fL (ref 78.0–100.0)
Monocytes Absolute: 0.6 10*3/uL (ref 0.1–1.0)
Monocytes Relative: 6.7 % (ref 3.0–12.0)
Neutro Abs: 6.5 10*3/uL (ref 1.4–7.7)
Neutrophils Relative %: 70.5 % (ref 43.0–77.0)
Platelets: 408 10*3/uL — ABNORMAL HIGH (ref 150.0–400.0)
RBC: 4.53 Mil/uL (ref 3.87–5.11)
RDW: 13.8 % (ref 11.5–15.5)
WBC: 9.3 10*3/uL (ref 4.0–10.5)

## 2023-08-27 NOTE — Patient Instructions (Signed)
We will check the labs today and will fill out the form for you.

## 2023-08-27 NOTE — Progress Notes (Addendum)
   Subjective:   Patient ID: [name] [name], female    DOB: [birth year], 67 y.o.   MRN: [id number]  HPI The patient is a 68 YO female coming in for follow up hospital (in with acute cholecystitis treated with antibiotics). Her husband is now on hospice and she is struggling a lot. She has had at least one major recurrence of abdominal pain which lasted a few hours. This was when she was in ER all day with husband. She is monitoring diet unsure what to avoid or eat. Denies nausea or vomiting. No diarrhea or constipation.   D/C summary was not reviewed as it was not available at time of appointment. To best of ability notes and labs throughout course were reviewed day of visit.  PMH, [facility], social history reviewed and updated  Review of Systems  Constitutional: Negative.   HENT: Negative.    Eyes: Negative.   Respiratory:  Negative for cough, chest tightness and shortness of breath.   Cardiovascular:  Negative for chest pain, palpitations and leg swelling.  Gastrointestinal:  Positive for abdominal pain. Negative for abdominal distention, constipation, diarrhea, nausea and vomiting.  Musculoskeletal: Negative.   Skin: Negative.   Neurological: Negative.   Psychiatric/Behavioral: Negative.      Objective:  Physical Exam Constitutional:      Appearance: She is well-developed.  HENT:     Head: Normocephalic and atraumatic.  Cardiovascular:     Rate and Rhythm: Normal rate and regular rhythm.  Pulmonary:     Effort: Pulmonary effort is normal. No respiratory distress.     Breath sounds: Normal breath sounds. No wheezing or rales.  Abdominal:     General: Bowel sounds are normal. There is no distension.     Palpations: Abdomen is soft.     Tenderness: There is no abdominal tenderness. There is no rebound.  Musculoskeletal:     Cervical back: Normal range of motion.  Skin:    General: Skin is warm and dry.  Neurological:     Mental Status: She is alert and oriented to person,  place, and time.     Coordination: Coordination normal.     Vitals:   08/27/23 0741 08/27/23 0744  BP: (!) 160/100 (!) 160/100  Pulse: 72   Temp: 97.6 F (36.4 C)   TempSrc: Oral   SpO2: 98%   Weight: 134 lb (60.8 kg)   Height: 5\' 3"  (1.6 m)     Assessment & Plan:  Visit time 25 minutes in face to face communication with patient and coordination of care, additional 15 minutes spent in record review, coordination or care, ordering tests, communicating/referring to other healthcare professionals, documenting in medical records all on the same day of the visit for total time 40 minutes spent on the visit.

## 2023-08-27 NOTE — Assessment & Plan Note (Signed)
Resolving checking CBC and CMP. She did have 1 recurrent episode since leaving the hospital. We discussed if this is persistent she may be candidate for cholecystectomy. She does not have fever or abdominal pain today and is finished with antibiotics.

## 2023-08-27 NOTE — Assessment & Plan Note (Signed)
Stable during hospital stay. Checking CMP today.

## 2023-08-31 ENCOUNTER — Telehealth: Payer: Self-pay | Admitting: Internal Medicine

## 2023-08-31 NOTE — Telephone Encounter (Signed)
 Copied from CRM [id number]. Topic: General - Other >> Aug 31, 2023  1:24 PM Adaysia C wrote: Reason for CRM: Patient called to find out if the provider signed the provider statement forms requested by her insurance and if the forms were ready for pick up. Please follow up with patient [phone]

## 2023-09-02 NOTE — Telephone Encounter (Signed)
 Signed and ready to picked up I  have paced up front

## 2023-09-06 ENCOUNTER — Ambulatory Visit: Payer: Commercial Managed Care - PPO | Admitting: Licensed Clinical Social Worker

## 2023-09-06 ENCOUNTER — Other Ambulatory Visit (HOSPITAL_BASED_OUTPATIENT_CLINIC_OR_DEPARTMENT_OTHER): Payer: Self-pay

## 2023-09-06 DIAGNOSIS — F411 Generalized anxiety disorder: Secondary | ICD-10-CM

## 2023-09-06 NOTE — Progress Notes (Signed)
 [facility] Counselor/Therapist Progress Note  Patient ID: [name] [name], MRN: [id number]    Date: 09/06/23  Time Spent: 0801  am - 0900 am : 59 Minutes  Treatment Type: Individual Therapy.  Reported Symptoms: Anxiety, stress, excessive worry   Mental Status Exam: Appearance:  Well Groomed     Behavior: Appropriate  Motor: Normal  Speech/Language:  Clear and Coherent  Affect: Appropriate  Mood: normal  Thought process: normal  Thought content:   WNL  Sensory/Perceptual disturbances:   WNL  Orientation: oriented to person, place, time/date, situation, day of week, month of year, and year  Attention: Good  Concentration: Good  Memory: WNL  Fund of knowledge:  Good  Insight:   Good  Judgment:  Good  Impulse Control: Good    Risk Assessment: Danger to Self:  No Self-injurious Behavior: No Danger to Others: No Duty to Warn:no Physical Aggression / Violence:No  Access to Firearms a concern: No  Gang Involvement:No   Diagnosis : Generalized Anxiety Disorder  COGNITIVE BEHAVIORAL THERAPY  Subjective:    [name] [name] participated from office, located at [facility] with Clinician present. [name] [name]  consented to treatment.   [name] [name] presented for her session in a positive mood [name] [name] shared that she has been proactive with taking care of things with their finances and meeting with an [employer] executive. Patient states that her husband has been doing better since returning the long term care facility. She reports that she visits him daily and continues to take things day by day. Patient reports that she continues to keep a routine to feel that there is a bit of normalcy in her life as well as a bit of control.   Clinician provided active listening and verbal interaction as forms of support for patient. Clinician encouraged patient to maintain self-care through this difficult time and to focus on staying healthy both physically and emotionally. Clinician  processed with patient that having a routine is very insightful and that it serves a plethora of benefits. Clinician discussed with patient the importance of accepting their emotions, prioritizing self-care, seeking support from friends and family, joining support groups, communicating openly with the hospice team, making cherished memories together, and accepting help when offered, all while focusing on quality time and comfort for their partner.   [name] [name] agreed to utilize her coping skills and implement new ones as the need arises. [name] [name] states she looks forward to the time spent with her husband and she has ask him some details about his wishes. She agrees to keep up her routine and maintain her exercise routine as well as making her time once gets home hers and not answering phone call that she feels will place her in a negative head space. Patient will continue with bi weekly therapy sessions and treatment plan will be revised by 01/28/2024.  Additional coping skills discusses and suggested during the session: Spend quality time with your husband doing activities you both enjoy, reminisce about shared experiences, and share photos or memorabilia.  Accept help when offered: Don't be afraid to ask for assistance from loved ones or consider respite care to give yourself a break when needed.  Engage in meaningful conversations: Talk about your feelings, fears, and hopes with your husband, even if communication becomes limited.  Consider spiritual practices: If applicable, lean on your faith or spiritual beliefs for comfort and guidance during this challenging time.  Focus on comfort and quality of life: Prioritize your husband's comfort by managing pain  effectively and providing emotional support.  Prepare for the future: Discuss end-of-life care preferences with your husband and make necessary legal arrangements when appropriate.  Important points to remember: Grief is a normal process: Don't feel  like you need to suppress your emotions; allow yourself to grieve in your own way and at your own pace.   [name] [name] MSW, LCSW/DATE 09/06/2023

## 2023-09-10 ENCOUNTER — Other Ambulatory Visit (HOSPITAL_BASED_OUTPATIENT_CLINIC_OR_DEPARTMENT_OTHER): Payer: Self-pay

## 2023-09-20 ENCOUNTER — Ambulatory Visit (INDEPENDENT_AMBULATORY_CARE_PROVIDER_SITE_OTHER): Admitting: Licensed Clinical Social Worker

## 2023-09-20 DIAGNOSIS — F411 Generalized anxiety disorder: Secondary | ICD-10-CM | POA: Diagnosis not present

## 2023-09-20 NOTE — Progress Notes (Signed)
 [facility] Counselor/Therapist Progress Note  Patient ID: [name] [name], MRN: [id number]    Date: 09/20/23  Time Spent: 0802  am - 0900 am : 58 Minutes  Treatment Type: Individual Therapy.  Reported Symptoms: Anxiety, stress, excessive worry   Mental Status Exam: Appearance:  Well Groomed     Behavior: Appropriate  Motor: Normal  Speech/Language:  Clear and Coherent  Affect: Appropriate  Mood: normal  Thought process: normal  Thought content:   WNL  Sensory/Perceptual disturbances:   WNL  Orientation: oriented to person, place, time/date, situation, day of week, month of year, and year  Attention: Good  Concentration: Good  Memory: WNL  Fund of knowledge:  Good  Insight:   Good  Judgment:  Good  Impulse Control: Good    Risk Assessment: Danger to Self:  No Self-injurious Behavior: No Danger to Others: No Duty to Warn:no Physical Aggression / Violence:No  Access to Firearms a concern: No  Gang Involvement:No    Diagnosis : Generalized Anxiety Disorder   COGNITIVE BEHAVIORAL THERAPY   Subjective:    [name] [name] participated from office, located at [facility] with Clinician present. [name] [name]  consented to treatment.     [name] [name] presented for her session in a positive mood. [name] [name] did report that [name] [name] had several good days with her husband but he has began to decline. Patient reports that his ability to stay awake has decreased and he has difficulty holding conversation. [name] [name] states he has returned to the [facility] but is on a different wing. [name] [name] reports that [name] [name] had to have him moved to another room due to the extra cost of the room he was in. Patient states that [name] [name] feels guilt due to having to have him moved to another room. [name] [name] states [name] [name] has also been struggling with having his name taken off of his car and her placed on it. [name] [name] stated that [name] [name] literally felt as though [name] [name] was stealing. Patient was very emotional and tearful as [name] [name]  described her feelings and her dedication to her husband is evident.  Clinician actively listened and provided patient with validation and support via verbal interaction. Clinician processed with patient her raw emotions and reminded her of the great job [name] [name] is doing in caring for her husband and taking care of his needs and finances. Clinician encouraged patient to remember the importance of self care. Clinician discussed with patient that self-care is essential for caregivers to maintain their physical, emotional, and mental health, ensuring they can effectively support their loved ones.   [name] [name] was insightful and proactive. [name] [name] also reports maintaining a daily routine by walking every morning with a group of friends. [name] [name] continues to work and be productive in an effort to keep busy and keep a routine. Clinician and patient processed how these things are very helpful in keeping her focussed and grounded. Patient will continue to focus on her goals. Patient will continue with bi weekly therapy and treatment plan will be reviewed by 03/29/2024. Additional coping skills discusses and suggested during the session: Spend quality time with your husband doing activities you both enjoy, reminisce about shared experiences, and share photos or memorabilia.  Accept help when offered: Don't be afraid to ask for assistance from loved ones or consider respite care to give yourself a break when needed.  Engage in meaningful conversations: Talk about your feelings, fears, and hopes with your husband, even if communication becomes limited.  Consider spiritual practices: If applicable, lean on  your faith or spiritual beliefs for comfort and guidance during this challenging time.  Focus on comfort and quality of life: Prioritize your husband's comfort by managing pain effectively and providing emotional support.  Prepare for the future: Discuss end-of-life care preferences with your husband and make necessary  legal arrangements when appropriate.  Important points to remember: Grief is a normal process: Don't feel like you need to suppress your emotions; allow yourself to grieve in your own way and at your own pace.    Diagnosis: Generalized Anxiety Disorder  Cognitive Behavioral Therapy   [name] MSW, LCSW/DATE 09/20/2023

## 2023-10-05 ENCOUNTER — Ambulatory Visit: Admitting: Licensed Clinical Social Worker

## 2023-10-05 ENCOUNTER — Telehealth: Payer: Self-pay

## 2023-10-05 NOTE — Telephone Encounter (Signed)
 No ultrasound or labs needed now - I will sort out at 4/23 visit

## 2023-10-05 NOTE — Telephone Encounter (Signed)
 Noted.

## 2023-10-05 NOTE — Telephone Encounter (Signed)
-----   Message from Nurse Joselyn Glassman sent at 05/05/2023 11:17 AM EDT ----- Personal reminder sent on 05/05/2023   We will need to repeat in 6 months. My staff will replace a reminder to do labs and ultrasound then. We cannot schedule an appointment that far out but I should see you sometime around April 2025 which would be 2 years from your last visit with Dr. Christella Hartigan.  ( Dr Leone Payor)

## 2023-10-05 NOTE — Telephone Encounter (Signed)
 Reminder received in Epic Pt chart reviewed and noted that pt was recently admitted to the Hospital in February. Pt had CT scan of abdomen /pelvis on 08/14/2023, Labs drawn during admission. Pt has been previously scheduled for an office visit on 10/27/2023 at 9:10 AM to see Dr. Leone Payor. Please review if you want repeat US and Labs completed.  Please note that AFP was not done during admission with other labs. Please review and advise

## 2023-10-09 IMAGING — US US ABDOMEN LIMITED
1 series · 14 of 25 positions shown · non-contrast
Comparison: Ultrasound 10/10/2020. Additional prior ultrasounds
reviewed.

CLINICAL DATA: Alcoholic cirrhosis.

EXAM:
ULTRASOUND ABDOMEN LIMITED RIGHT UPPER QUADRANT

[Series 1: us abdomen limited ruq (liver/gb) · 14 of 41 slices shown]
[im 1/41]
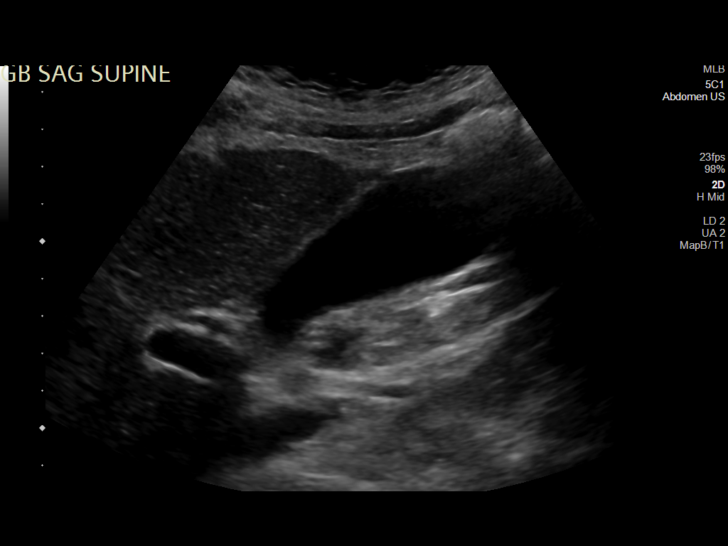
[im 4/41]
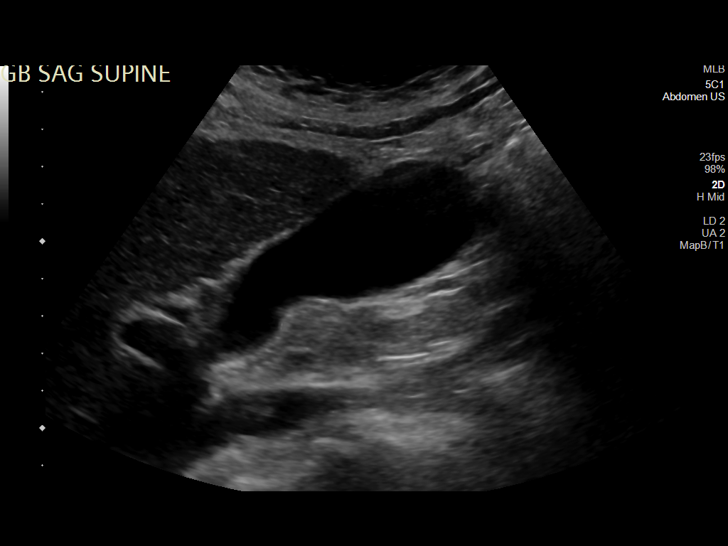
[im 7/41]
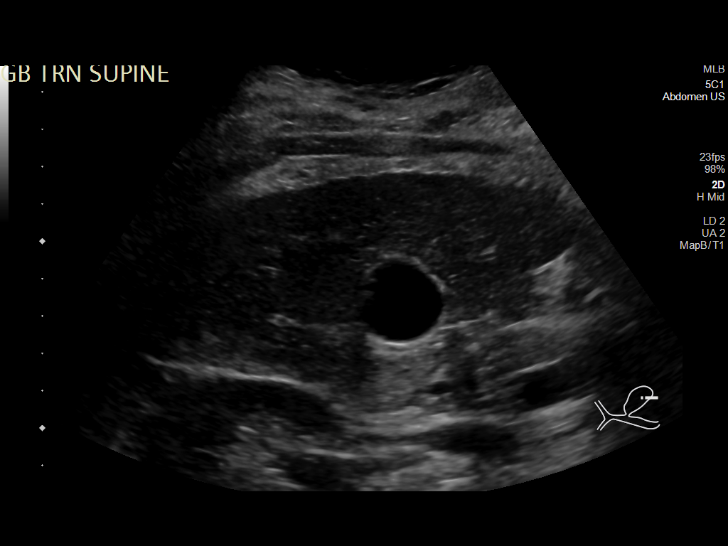
[im 11/41]
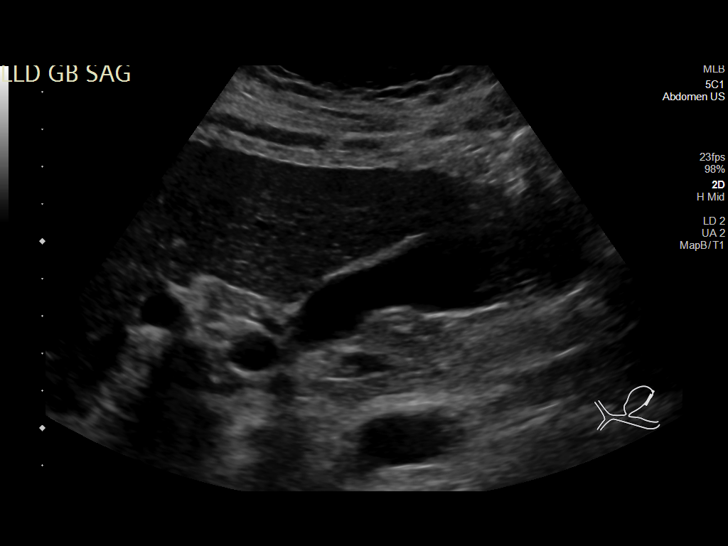
[im 14/41]
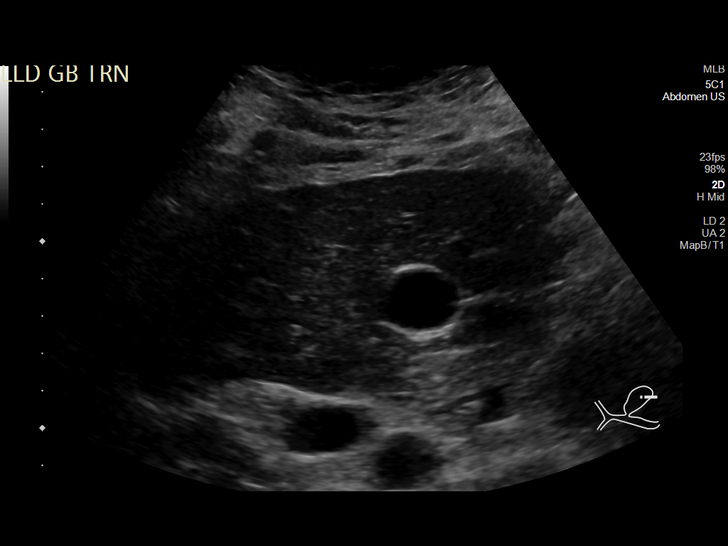
[im 16/41]
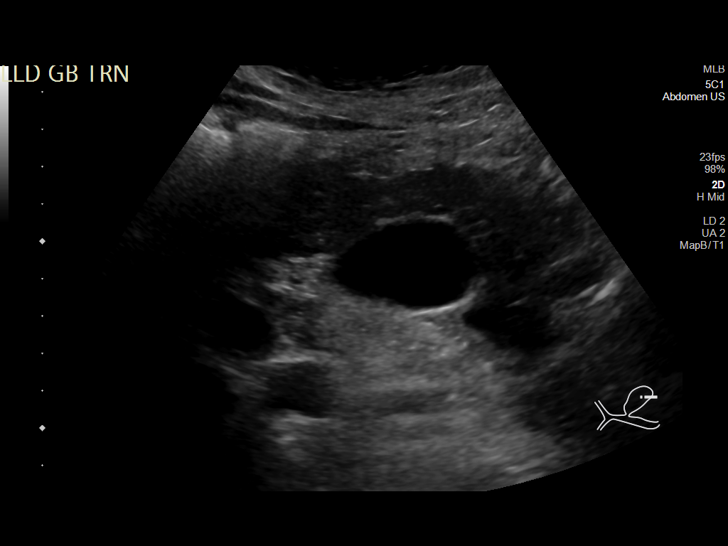
[im 19/41]
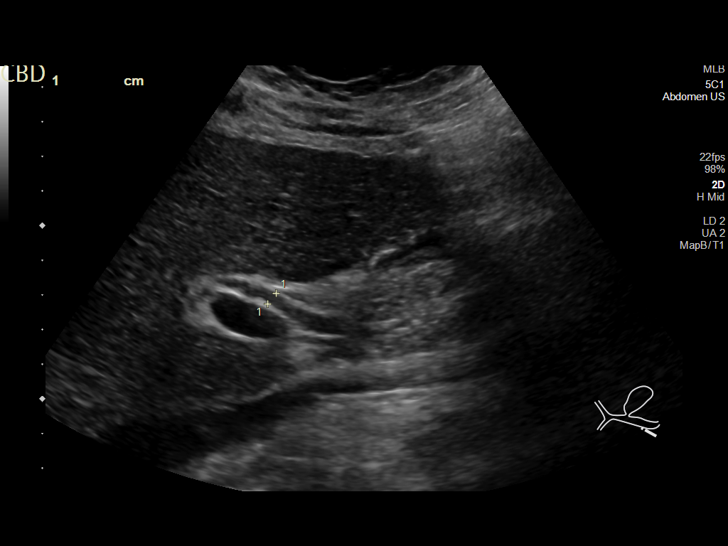
[im 22/41]
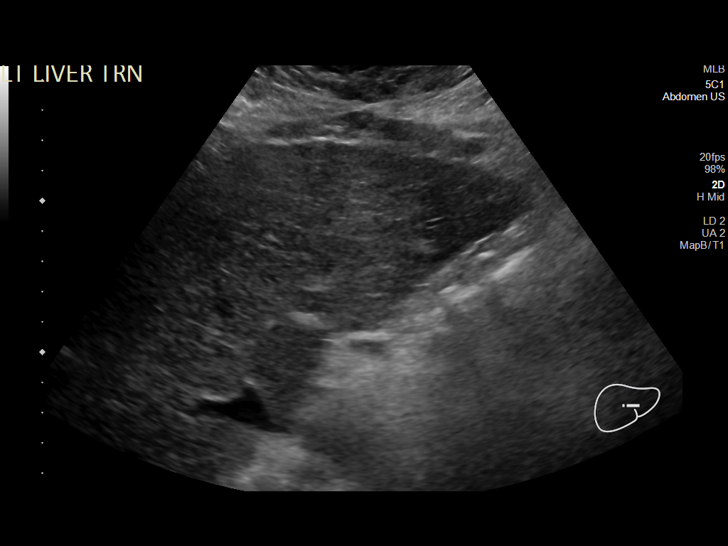
[im 26/41]
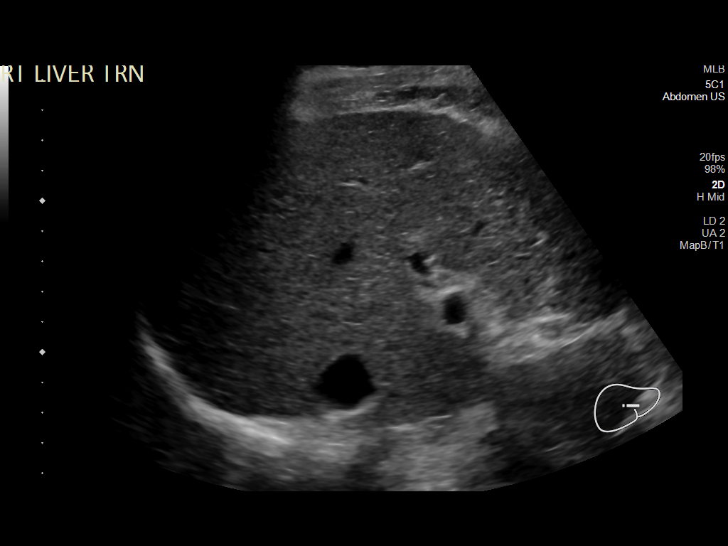
[im 27/41]
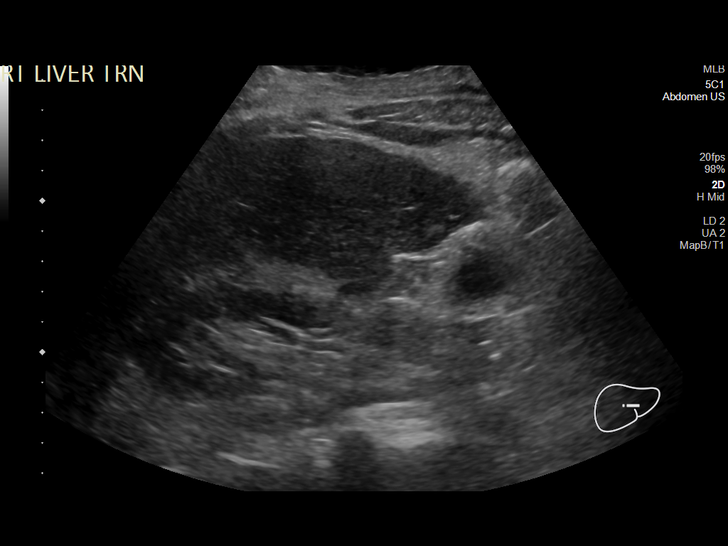
[im 31/41]
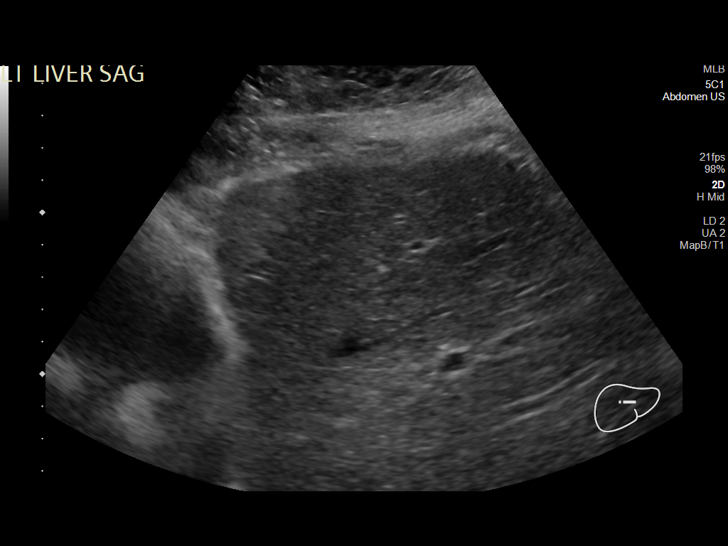
[im 34/41]
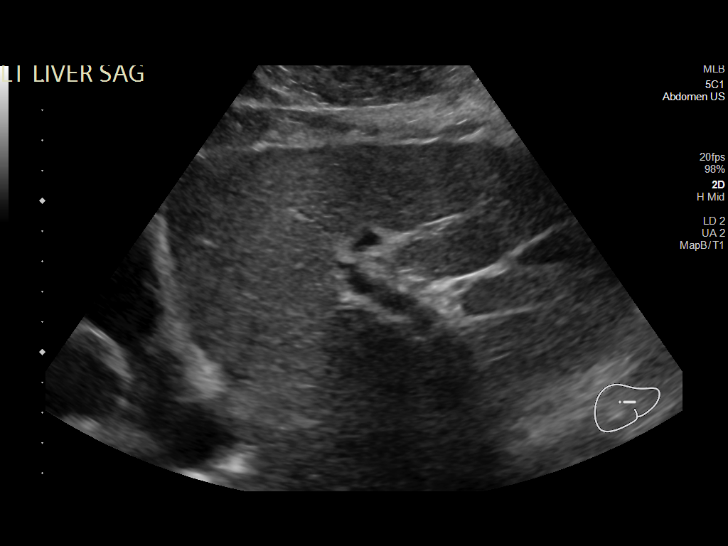
[im 37/41]
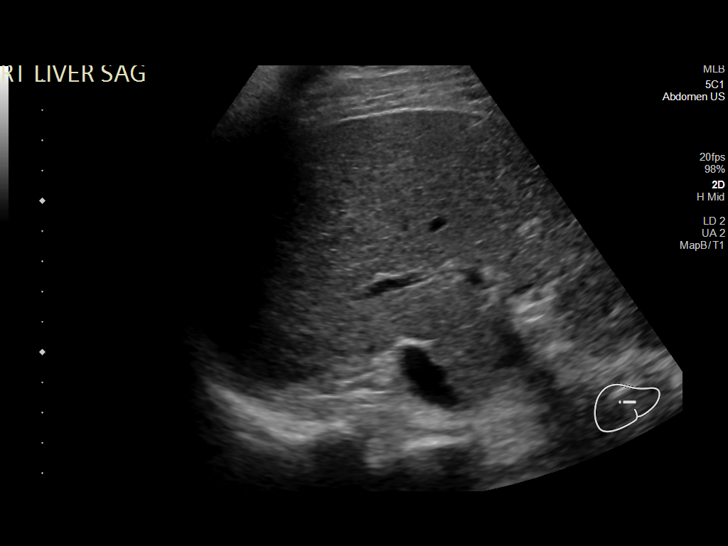
[im 41/41]
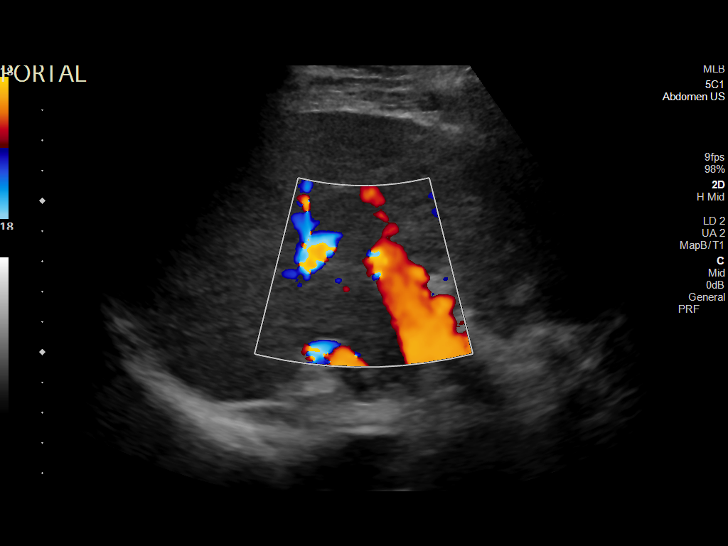

[14 of 25 positions shown; findings below may reference images not displayed]

FINDINGS: Gallbladder:

Physiologically distended. No gallstones or wall thickening
visualized. No sonographic Murphy sign noted by sonographer.

Common bile duct:

Diameter: 4 mm, normal.

Liver:

Heterogeneous coarsened parenchymal echogenicity with nodular
contour. No discrete focal lesion. Portal vein is patent on color
Doppler imaging with normal direction of blood flow towards the
liver.

Other: No right upper quadrant ascites.
IMPRESSION: 1. Cirrhotic hepatic morphology without focal lesion.
2. Normal sonographic appearance of the gallbladder and biliary
tree.

## 2023-10-18 ENCOUNTER — Ambulatory Visit: Admitting: Licensed Clinical Social Worker

## 2023-10-27 ENCOUNTER — Ambulatory Visit: Payer: Commercial Managed Care - PPO | Admitting: Internal Medicine

## 2023-10-27 ENCOUNTER — Encounter: Payer: Self-pay | Admitting: Internal Medicine

## 2023-10-27 ENCOUNTER — Other Ambulatory Visit

## 2023-10-27 VITALS — BP 146/88 | HR 67 | Wt 137.0 lb

## 2023-10-27 DIAGNOSIS — K810 Acute cholecystitis: Secondary | ICD-10-CM

## 2023-10-27 DIAGNOSIS — K7030 Alcoholic cirrhosis of liver without ascites: Secondary | ICD-10-CM | POA: Diagnosis not present

## 2023-10-27 DIAGNOSIS — I10 Essential (primary) hypertension: Secondary | ICD-10-CM

## 2023-10-27 DIAGNOSIS — F1090 Alcohol use, unspecified, uncomplicated: Secondary | ICD-10-CM | POA: Diagnosis not present

## 2023-10-27 NOTE — Patient Instructions (Signed)
 Your provider has requested that you go to the basement level for lab work before leaving today. Press "B" on the elevator. The lab is located at the first door on the left as you exit the elevator.  Due to recent changes in healthcare laws, you may see the results of your imaging and laboratory studies on MyChart before your provider has had a chance to review them.  We understand that in some cases there may be results that are confusing or concerning to you. Not all laboratory results come back in the same time frame and the provider may be waiting for multiple results in order to interpret others.  Please give us  48 hours in order for your provider to thoroughly review all the results before contacting the office for clarification of your results.    _______________________________________________________  If your blood pressure at your visit was 140/90 or greater, please contact your primary care physician to follow up on this.  _______________________________________________________  If you are age 69 or older, your body mass index should be between 23-30. Your Body mass index is 24.27 kg/m. If this is out of the aforementioned range listed, please consider follow up with your Primary Care Provider.  If you are age 84 or younger, your body mass index should be between 19-25. Your Body mass index is 24.27 kg/m. If this is out of the aformentioned range listed, please consider follow up with your Primary Care Provider.   ________________________________________________________  The [facility] providers would like to encourage you to use MYCHART to communicate with providers for non-urgent requests or questions.  Due to long hold times on the telephone, sending your provider a message by [facility] may be a faster and more efficient way to get a response.  Please allow 48 business hours for a response.  Please remember that this is for non-urgent requests.   _______________________________________________________  I appreciate the opportunity to care for you. Loy Ruff, MD, [facility]

## 2023-10-27 NOTE — Progress Notes (Signed)
 [name] [name] 67 y.o. 07-12-1955 [id number]  Assessment & Plan:   Encounter Diagnoses  Name Primary?   Alcoholic cirrhosis of liver without ascites (HCC) Yes   Acute acalculous cholecystitis - resolved    Essential hypertension    Cholecystitis is resolved.  I do not think anything further needs to be done with that.  Cirrhosis seems stable and she did not have signs of portal hypertension on her EGD in 2020 and was not having routine EGD. Platelets are normal which goes against the need for EGD to screen.  No signs of portal hypertension other than a recanalized umbilical vein on recent CT scan.  Unclear to me if she needs repeat EGD.  It is not a good time with her husband dying from liver disease.  Will revisit later this year.  She will need labs in about 5 to 6 months and we can revisit it then.  I will explain this to her once alpha-fetoprotein returns.   MELD 3.0: 11 at 08/16/2023  4:32 AM MELD-Na: 9 at 08/16/2023  4:32 AM Calculated from: Serum Creatinine: 0.65 mg/dL (Using min of 1 mg/dL) at 01/28/3663  4:03 AM Serum Sodium: 139 mmol/L (Using max of 137 mmol/L) at 08/16/2023  4:32 AM Total Bilirubin: 0.6 mg/dL (Using min of 1 mg/dL) at 4/74/2595  6:38 AM Serum Albumin: 2.8 g/dL at 7/56/4332  9:51 AM INR(ratio): 1.3 at 08/15/2023 12:57 AM Age at listing (hypothetical): 89 years Sex: Female at 08/16/2023  4:32 AM  CC: [name] Homestead, MD    Subjective:  Review of pertinent gastrointestinal problems:   1. Alcoholic cirrhosis, extensive, long history of drinking, presented winter, 2008 with jaundice and ascites. Hospitalized April, 2008 for several days. Her most recent imaging was an ultrasound April, 2007 showing an echogenic nodular liver consistent with cirrhosis, no obvious masses. Ascites. In June 2008, difficult to control electrolytes and ascites. Renal consultation with Dr. Irene Mannheim. The patient is currently on Lasix  80 mg t.i.d. and very low dose aldactone   25 mg once daily with poor control of ascites. Early July 2008, large volume paracentesis with 9 liters of ascites removed (with albumin infusion). Chronic liver disease workup hepatitis A antibody positive (old exposure). Hepatitis B negative, hepatitis C negative; alpha 1 antitrypsin ceruloplasmin, ANA, AMA, antismooth muscle antibody all normal. Iron tests essentially normal. 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 Started on nadolol  20 mg daily. Summer, 2009 modifying her diuretic regimen to a more Aldactone  based regimen.   Most recent EGD: 08/2018; no signs of portal hypertension.  There was mild pan gastritis that was negative for H. pylori.  I recommended over-the-counter strength omeprazole once daily, possibly this was NSAID related changes Was seen at [facility], Dr. Zamor, for pre-transplant workup. Was bumped off transplant list due to bladder tumor diagnosis in 2012.   Most recent MELD 8 (10/2021 labs)   Most recent AFP normal, October 2024 Most recent liver imaging CT abdomen pelvis February 2025 cirrhosis no sign of tumor, recanalized umbilical vein 2. Routine risk for colon cancer.  Colonoscopy 07/2017 Dr. Howard Macho found a single subcentimeter polyp that was not precancerous.   --------------------------------------------------------------------------------------------------------------  Chief Complaint: Follow-up after cholecystitis  HPI 68 year old woman with alcoholic liver cirrhosis (previously followed by Dr. Howard Macho) which has been stable, admitted in February for suspected acute acalculous cholecystitis, she was treated with IV and then oral antibiotics (Augmentin  and metronidazole ) and was discharged on February 10.  Due to the  cirrhosis causing higher risk for surgery nonoperative approach was undertaken per surgery. She has been undergoing surveillance/screening ultrasounds in 04/26/2019 for cirrhosis of the liver was seen but no  cholelithiasis. She saw Dr. Nicolette Barrio in follow-up in February and had normal LFTs as below.  She feels well now though she has noted her blood pressure has been elevated and that her amlodipine  was discontinued at discharge.  I looked at the notes and there was no rationale for that explained.  She is inclined to restart it.  She also had reduction in dose of her furosemide  from 40 to 20 mg and spironolactone  from 50 to 25 mg and she has tolerated that without edema or significant weight change.  Husband is in hospice care with end-stage alcoholic liver disease.  Significant source of stress at this time.   Wt Readings from Last 3 Encounters:  10/27/23 137 lb (62.1 kg)  08/27/23 134 lb (60.8 kg)  08/13/23 135 lb (61.2 kg)    Lab Results  Component Value Date   ALT 17 08/27/2023   AST 27 08/27/2023   ALKPHOS 86 08/27/2023   BILITOT 1.0 08/27/2023   Lab Results  Component Value Date   INR 1.3 (H) 08/15/2023   INR 1.2 (H) 04/30/2023   INR 1.3 (H) 10/21/2022   Lab Results  Component Value Date   WBC 9.3 08/27/2023   HGB 14.8 08/27/2023   HCT 44.0 08/27/2023   MCV 97.2 08/27/2023   PLT 408.0 (H) 08/27/2023     CT abdomen pelvis 08/14/2023 IMPRESSION: Distended gallbladder with wall thickening and surrounding inflammation. Appearance is concerning for acute cholecystitis.   Changes of cirrhosis with recanalized umbilical vein.  AFP was 4.1 04/30/2023.  Allergies  Allergen Reactions   Lisinopril     REACTION: unspecified   Sulfamethoxazole-Trimethoprim     REACTION: unspecified   Current Meds  Medication Sig   albuterol  (VENTOLIN  HFA) 108 (90 Base) MCG/ACT inhaler Inhale 2 puffs into the lungs every 4 (four) hours as needed. (Patient taking differently: Inhale 2 puffs into the lungs every 4 (four) hours as needed for wheezing or shortness of breath.)   amLODipine  (NORVASC ) 5 MG tablet Take 5 mg by mouth daily.   b complex vitamins tablet Take 1 tablet by mouth daily.    fluticasone  (FLONASE ) 50 MCG/ACT nasal spray Place 2 sprays into both nostrils daily.   furosemide  (LASIX ) 20 MG tablet Take 1 tablet (20 mg total) by mouth daily.   omeprazole (PRILOSEC OTC) 20 MG tablet Take 20 mg by mouth daily.   Prenatal Multivit-Min-Fe-FA (PRE-NATAL FORMULA) TABS Take 1 tablet by mouth daily.   Past Medical History:  Diagnosis Date   Allergy    mild   Anxiety June 2024   Arthritis    Asthma    Cancer (HCC)    bladder cancer   Cirrhosis, alcoholic (HCC)    Esophageal varices (HCC)    GERD (gastroesophageal reflux disease)    Hypertension    Hyponatremia    Psoriasis    Past Surgical History:  Procedure Laterality Date   BASAL CELL CARCINOMA EXCISION Right    Ear   BLADDER SURGERY     Tumor removed    BREAST REDUCTION SURGERY     Bilateral    COLONOSCOPY     COSMETIC SURGERY  1992   REDUCTION MAMMAPLASTY     UPPER GASTROINTESTINAL ENDOSCOPY     Social History   Social History Narrative   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   Review of Systems As above  Objective:   Physical Exam @BP  (!) 146/88   Pulse 67   Wt 137 lb (62.1 kg)   BMI 24.27 kg/m @  General:  NAD Eyes:   anicteric Lungs:  clear Heart::  S1S2 no rubs, murmurs or gallops Abdomen:  soft and nontender, BS+, no HSM/mass Ext:   no edema, cyanosis or clubbing    Data Reviewed:  See HPI

## 2023-10-28 ENCOUNTER — Other Ambulatory Visit: Payer: Self-pay | Admitting: Internal Medicine

## 2023-10-28 ENCOUNTER — Other Ambulatory Visit (HOSPITAL_BASED_OUTPATIENT_CLINIC_OR_DEPARTMENT_OTHER): Payer: Self-pay

## 2023-10-28 ENCOUNTER — Encounter: Payer: Self-pay | Admitting: Internal Medicine

## 2023-10-28 ENCOUNTER — Other Ambulatory Visit: Payer: Self-pay

## 2023-10-28 LAB — AFP TUMOR MARKER: AFP-Tumor Marker: 4.9 ng/mL

## 2023-10-28 MED ORDER — SPIRONOLACTONE 25 MG PO TABS
25.0000 mg | ORAL_TABLET | Freq: Every day | ORAL | 3 refills | Status: AC
  Filled 2023-10-28: qty 90, 90d supply, fill #0
  Filled 2024-01-22: qty 90, 90d supply, fill #1
  Filled 2024-05-15: qty 90, 90d supply, fill #2
  Filled 2024-08-09: qty 90, 90d supply, fill #3

## 2023-10-28 MED ORDER — FUROSEMIDE 20 MG PO TABS
20.0000 mg | ORAL_TABLET | Freq: Every day | ORAL | 3 refills | Status: DC
  Filled 2023-10-28: qty 90, 90d supply, fill #0
  Filled 2024-01-22: qty 90, 90d supply, fill #1

## 2023-10-28 NOTE — Telephone Encounter (Signed)
 [facility] to refill

## 2023-10-28 NOTE — Telephone Encounter (Signed)
 If the patient is good with 90-day supply go ahead and provide refills for a year

## 2023-10-29 ENCOUNTER — Other Ambulatory Visit: Payer: Self-pay

## 2023-10-29 ENCOUNTER — Other Ambulatory Visit (HOSPITAL_BASED_OUTPATIENT_CLINIC_OR_DEPARTMENT_OTHER): Payer: Self-pay

## 2023-10-29 MED ORDER — AMLODIPINE BESYLATE 5 MG PO TABS
5.0000 mg | ORAL_TABLET | Freq: Every day | ORAL | 3 refills | Status: DC
  Filled 2023-10-29: qty 90, 90d supply, fill #0

## 2023-11-01 ENCOUNTER — Ambulatory Visit (INDEPENDENT_AMBULATORY_CARE_PROVIDER_SITE_OTHER): Admitting: Licensed Clinical Social Worker

## 2023-11-01 DIAGNOSIS — F411 Generalized anxiety disorder: Secondary | ICD-10-CM | POA: Diagnosis not present

## 2023-11-01 NOTE — Progress Notes (Signed)
 [name] [name]  Patient ID: [name] [name], MRN: [id number]    Date: 11/01/23  Time Spent: 0740  am - 805 am : 25 Minutes  Treatment Type: Individual Therapy.  Reported Symptoms: (Current) Patient lost her husband yesterday and is struggling with grief.   Mental Status Exam: Appearance:  NA      Behavior: Appropriate  Motor: NA  Speech/Language:  Clear and Coherent  Affect: NA  Mood: sad  Thought process: normal  Thought content:   WNL  Sensory/Perceptual disturbances:   WNL  Orientation: oriented to person, place, time/date, situation, day of week, month of year, and year  Attention: Good  Concentration: Good  Memory: WNL  Fund of knowledge:  Good  Insight:   Good  Judgment:  Good  Impulse Control: Good   Risk Assessment: Danger to Self:  No Self-injurious Behavior: No Danger to Others: No Duty to Warn:no Physical Aggression / Violence:No  Access to Firearms a concern: No  Gang Involvement:No   Subjective:   [name] [name] participated from home, via phone, and is aware of risk and limitations. [name] [name] consented to treatment. Therapist participated from home office. We met online due to [name] [name] being ill and patient needing to speak with [name] [name]. We utilized the phone due to the situation.  [name] [name] presented for her call very tearful and sad. She shared that her husband passed alone through the night. She states she is lost without him but also recognizes that life must go on. [name] [name] states that he suffered so badly and that although he is gone she knows that he is finally at rest. [name] [name] reports that she continues to stick to her routine and is aware of the importance of staying active and engaged. [name] [name] discussed with [name] [name] the idea and uncertainty involved in building a new normal.   [name] [name] actively listened and provided support and encouragement via verbal interaction. [name] [name] processed patients feelings with  her and encouraged her to handle her grief at her own pace as their is no blueprint for grief. [name] [name] praised patient for her insightfulness and how she has been so strong through all of this. [name] [name] encouraged patient to make self-care a priority through this difficult time.   [name] [name] was very receptive to feedback and acknowledges the importance of staying on a routine for her well being. Patient states she has plans to meet today with [facility] and make the arrangements. She reports that she plans not to have a service because he doesn't have any family or friends here. [name] [name] states she plans to spread his ashes at a later time. [name] [name] agrees to utilize her coping skills and knowledge to stay busy and interacting with others. [name] [name] will continue with bi weekly therapy and treatment plan will be reviewed by 01/06/2024.  Interventions: Cognitive Behavioral Therapy  Diagnosis: Generalized Anxiety Disorder   Keenan Pastor MSW, LCSW/DATE 11/01/2023

## 2023-11-16 ENCOUNTER — Encounter: Payer: Self-pay | Admitting: Plastic Surgery

## 2023-11-16 ENCOUNTER — Ambulatory Visit (INDEPENDENT_AMBULATORY_CARE_PROVIDER_SITE_OTHER): Payer: Commercial Managed Care - PPO | Admitting: Plastic Surgery

## 2023-11-16 VITALS — BP 162/107 | HR 81

## 2023-11-16 DIAGNOSIS — H02834 Dermatochalasis of left upper eyelid: Secondary | ICD-10-CM

## 2023-11-16 DIAGNOSIS — H02403 Unspecified ptosis of bilateral eyelids: Secondary | ICD-10-CM

## 2023-11-16 DIAGNOSIS — Z719 Counseling, unspecified: Secondary | ICD-10-CM

## 2023-11-16 DIAGNOSIS — H02839 Dermatochalasis of unspecified eye, unspecified eyelid: Secondary | ICD-10-CM | POA: Insufficient documentation

## 2023-11-16 DIAGNOSIS — H02831 Dermatochalasis of right upper eyelid: Secondary | ICD-10-CM

## 2023-11-16 NOTE — Progress Notes (Signed)
 Patient ID: [name] [name], female    DOB: [birth year], 68 y.o.   MRN: [id number]   Chief Complaint  Patient presents with   Advice Only    The patient is a 68 year old female here for evaluation of her face.  She is hoping to get healthier skin.  She also notes that she has some visual disturbance particularly in the evening as her legs get heavy.  Today it is in the morning and her lids are resting on her are at the level of her pupil.  She does not seem to have any other issues and no signs of infection.  Her exams are up to date.  I think that a visual field exam would be helpful information.  The other things such as filler skin care and Botox are options for the wrinkling.  The patient has a lot of hyperpigmentation from sun damage.  The last time she had filler was only about 2 months ago and that was around her mouth.  I would wait before she does any more.    Review of Systems  Constitutional: Negative.   Eyes: Negative.   Respiratory: Negative.    Cardiovascular: Negative.   Gastrointestinal: Negative.   Endocrine: Negative.     Past Medical History:  Diagnosis Date   Allergy    mild   Anxiety June 2024   Arthritis    Asthma    Cancer (HCC)    bladder cancer   Cirrhosis, alcoholic (HCC)    Esophageal varices (HCC)    GERD (gastroesophageal reflux disease)    Hypertension    Hyponatremia    Psoriasis     Past Surgical History:  Procedure Laterality Date   BASAL CELL CARCINOMA EXCISION Right    Ear   BLADDER SURGERY     Tumor removed    BREAST REDUCTION SURGERY     Bilateral    COLONOSCOPY     COSMETIC SURGERY  1992   REDUCTION MAMMAPLASTY     UPPER GASTROINTESTINAL ENDOSCOPY        Current Outpatient Medications:    albuterol  (VENTOLIN  HFA) 108 (90 Base) MCG/ACT inhaler, Inhale 2 puffs into the lungs every 4 (four) hours as needed. (Patient taking differently: Inhale 2 puffs into the lungs every 4 (four) hours as needed for wheezing or shortness of  breath.), Disp: 6.7 g, Rfl: 5   amLODipine  (NORVASC ) 5 MG tablet, Take 1 tablet (5 mg total) by mouth daily., Disp: 90 tablet, Rfl: 3   b complex vitamins tablet, Take 1 tablet by mouth daily., Disp: , Rfl:    fluticasone  (FLONASE ) 50 MCG/ACT nasal spray, Place 2 sprays into both nostrils daily., Disp: 48 g, Rfl: 3   furosemide  (LASIX ) 20 MG tablet, Take 1 tablet (20 mg total) by mouth daily., Disp: 90 tablet, Rfl: 3   omeprazole (PRILOSEC OTC) 20 MG tablet, Take 20 mg by mouth daily., Disp: , Rfl:    Prenatal Multivit-Min-Fe-FA (PRE-NATAL FORMULA) TABS, Take 1 tablet by mouth daily., Disp: , Rfl:    spironolactone  (ALDACTONE ) 25 MG tablet, Take 1 tablet (25 mg total) by mouth daily., Disp: 90 tablet, Rfl: 3   Objective:   Vitals:   11/16/23 0953  BP: (!) 162/107  Pulse: 81  SpO2: 97%    Physical Exam Constitutional:      Appearance: Normal appearance.  Cardiovascular:     Rate and Rhythm: Normal rate.     Pulses: Normal pulses.  Skin:  General: Skin is warm.     Capillary Refill: Capillary refill takes less than 2 seconds.     Coloration: Skin is not jaundiced.     Findings: No bruising.  Neurological:     Mental Status: She is alert and oriented to person, place, and time.  Psychiatric:        Mood and Affect: Mood normal.        Behavior: Behavior normal.        Thought Content: Thought content normal.        Judgment: Judgment normal.     Assessment & Plan:  Encounter for counseling  Ptosis of eyelid, bilateral - Plan: Ambulatory referral to Ophthalmology  Dermatochalasis of both upper eyelids  Recommend visual field exam for further evaluation which may lead to a blepharoplasty.  She is a very good candidate for BBL and then Moxi or halo.  Will also get her into see Lily for consultation and I have recommended the [facility] skin care product that will improve the hyperpigmentation.  Pictures were obtained of the patient and placed in the chart with the patient's or  guardian's permission.  Lindaann Requena Meggen Spaziani, DO

## 2023-11-22 ENCOUNTER — Ambulatory Visit: Payer: Self-pay

## 2023-11-30 ENCOUNTER — Encounter: Payer: Self-pay | Admitting: Internal Medicine

## 2023-11-30 ENCOUNTER — Telehealth (INDEPENDENT_AMBULATORY_CARE_PROVIDER_SITE_OTHER): Admitting: Internal Medicine

## 2023-11-30 VITALS — BP 145/92 | Ht 63.0 in | Wt 133.0 lb

## 2023-11-30 DIAGNOSIS — I10 Essential (primary) hypertension: Secondary | ICD-10-CM | POA: Diagnosis not present

## 2023-11-30 NOTE — Assessment & Plan Note (Signed)
 Increase amlodipine  to 10 mg daily keep lasix  20 mg daily and spironolactone  25 mg daily. (Prior to hospitalization was on 40 mg and 50 mg respectively of lasix  and spironolactone ). If BP not at goal make further adjustments.

## 2023-11-30 NOTE — Progress Notes (Signed)
 Virtual Visit via Video Note  I connected with [name] [name] on 11/30/23 at  8:20 AM EDT by a video enabled telemedicine application and verified that I am speaking with the correct person using two identifiers.  The patient and the provider were at separate locations throughout the entire encounter. Patient location: home, Provider location: work   I discussed the limitations of evaluation and management by telemedicine and the availability of in person appointments. The patient expressed understanding and agreed to proceed. The patient and the provider were the only parties present for the visit unless noted in HPI below.  History of Present Illness: The patient is a 68 y.o. female with visit for high blood pressure. Husband passed about 1 month ago. Overall it is running high most places and at home. Has not missed medicine.  Observations/Objective: Appearance: normal, breathing appears normal, casual grooming, mental status is A and O times 3  Assessment and Plan: See problem oriented charting  Follow Up Instructions: increase amlodipine  to 10 mg daily keep spironolactone  and lasix  same dose, she will let us  know how BP are doing in 1-2 weeks, discussed coping skills  I discussed the assessment and treatment plan with the patient. The patient was provided an opportunity to ask questions and all were answered. The patient agreed with the plan and demonstrated an understanding of the instructions.   The patient was advised to call back or seek an in-person evaluation if the symptoms worsen or if the condition fails to improve as anticipated.  [name], MD

## 2023-12-06 ENCOUNTER — Ambulatory Visit (INDEPENDENT_AMBULATORY_CARE_PROVIDER_SITE_OTHER): Admitting: Licensed Clinical Social Worker

## 2023-12-06 DIAGNOSIS — F411 Generalized anxiety disorder: Secondary | ICD-10-CM

## 2023-12-06 NOTE — Progress Notes (Signed)
 [facility] Counselor/Therapist Progress Note  Patient ID: TYMESHIA AWAN, MRN: [id number]    Date: 12/06/23  Time Spent: 0803  am - 0906 am : 63 Minutes  Treatment Type: Individual Therapy.  Reported Symptoms: Leighana presented with symptoms of depression and anxiety related to the grief over her husband passing a little over a month ago. Patient reports that she cries daily and struggles to find things to do in the evenings to assist her with staying busy. Patient states she has been having issues with her blood pressure likely related to her stress.  Mental Status Exam: Appearance:  NA     Behavior: Appropriate  Motor: NA  Speech/Language:  Clear and Coherent  Affect: NA  Mood: sad  Thought process: normal  Thought content:   WNL  Sensory/Perceptual disturbances:   WNL  Orientation: oriented to person, place, time/date, situation, day of week, month of year, and year  Attention: Good  Concentration: Good  Memory: WNL  Fund of knowledge:  Good  Insight:   Good  Judgment:  Good  Impulse Control: Good    Risk Assessment: Danger to Self:  No Self-injurious Behavior: No Danger to Others: No Duty to Warn:no Physical Aggression / Violence:No  Access to Firearms a concern: No  Gang Involvement:No    Subjective:    [place] participated from office located at [facility] with Clinician present. Reily consented to treatment.  Natosha presented for her session on time and engaged in therapy session. Mahalia shared that the past month has been difficult but she has remained busy and got up everyday and kept her exercise routine with her friends. Delayla reports that her work family and friends have been very supportive and understanding. Armiyah reports that she and her husband were together for 50 years and she often feels lost without him. She reports that the most difficult time of day is around 4 to 6 pm. She states this is the time she was with her spouse  and the facility. Malva states that although she grieves him being gone, she knows he is no longer suffering.   Clinician actively listened and provided a safe space for patient to cry and share her feelings. Clinician processed the states of grief with patient and discussed that we go through those stages at different times and at times we may repeat them. Clinician provided encouragement to patient as well as praise for knowing the importance of keeping a routine and her positive coping skills. We processed coping skills for stress related to the loss of her spouse.  Mi was fully interactive during session. She was transparent about her grief and her desire to take just one day/minute at a time. Deztiny is insightful and has good coping skills. Mercades has a good support network and has already been proactive in handling the business affairs related to her husbands passing. She will continue to utilize coping skills to cope with her anxiety and her grief. Zanayah will continue with bi-weekly CBT therapy. Treatment plan will be reviewed by 01/06/2024.  Interventions: Cognitive Behavioral Therapy and Grief Therapy  Diagnosis: Generalized Anxiety Disorder  Establishing a routine, staying active and focusing on your health, seeking social support, and practicing self-care are all important steps in the grieving process. It's also crucial to acknowledge and allow yourself to feel the full range of emotions associated with grief without judgment.  Elaboration on Coping Strategies: Establish a Routine: Creating a structured day can provide a sense of normalcy  and control during a time of uncertainty. This can include waking up at similar times, eating meals regularly, and planning simple activities.  Stay Active and Focus on Health: Physical activity, like walking or outdoor time, can be beneficial. Prioritizing your physical health through healthy eating, limiting alcohol, and seeking medical attention when  needed can also help manage stress and fatigue.  Connect with Others: Seeking support from family, friends, or a therapist can provide a valuable outlet for expressing emotions and sharing memories. Attending social events, even if you don't feel like it, can also help.  Practice Self-Care: Taking care of your physical and emotional needs is essential. This can include activities like journaling, listening to music, or spending time in nature.  Allow Yourself to Feel: Grief is a complex and individual process. It's important to allow yourself to experience the full range of emotions associated with loss, including sadness, anger, and even joy or relief.  Plan Ahead for Triggers: Be prepared for anniversaries, holidays, and other significant dates that may trigger strong emotional responses. Plan ways to cope with these triggers, such as spending time with loved ones or engaging in creative activities.   Seeking support from loved ones, engaging in self-care, and utilizing relaxation techniques can help manage anxiety during grief. Additionally, professional help through therapy or medication might be beneficial in some cases.  Emotional Coping: Acknowledge and accept your feelings: . Allow yourself to experience the full range of emotions associated with grief, including anxiety, sadness, and anger.  Seek social support: . Talk to family, friends, or join support groups where you can share your experiences and receive understanding.  Express your feelings: . Journaling, creative outlets, or talking to a therapist can help process your emotions and reduce anxiety.  Physical Coping: Prioritize self-care: . Focus on getting enough sleep, eating nutritious foods, and engaging in physical activity, as these practices can help alleviate stress and anxiety.  Practice relaxation techniques: Aaron Aas Deep breathing exercises, mindfulness, or meditation can help calm the nervous system and reduce physical  symptoms of anxiety.   Keenan Pastor MSW, LCSW/DATE 12/06/2023

## 2023-12-10 DIAGNOSIS — Z01419 Encounter for gynecological examination (general) (routine) without abnormal findings: Secondary | ICD-10-CM | POA: Diagnosis not present

## 2023-12-10 DIAGNOSIS — Z1389 Encounter for screening for other disorder: Secondary | ICD-10-CM | POA: Diagnosis not present

## 2023-12-10 DIAGNOSIS — Z124 Encounter for screening for malignant neoplasm of cervix: Secondary | ICD-10-CM | POA: Diagnosis not present

## 2023-12-14 ENCOUNTER — Telehealth: Admitting: Plastic Surgery

## 2023-12-20 ENCOUNTER — Ambulatory Visit (INDEPENDENT_AMBULATORY_CARE_PROVIDER_SITE_OTHER): Admitting: Licensed Clinical Social Worker

## 2023-12-20 DIAGNOSIS — F411 Generalized anxiety disorder: Secondary | ICD-10-CM | POA: Diagnosis not present

## 2023-12-20 NOTE — Progress Notes (Signed)
 [facility] Counselor/Therapist Progress Note  Patient ID: [name] [name], MRN: [id number]    Date: 12/20/23  Time Spent: 0800  am - 0900 am : 60 Minutes  Treatment Type: Individual Therapy.  Reported Symptoms: [name] [name] presented with symptoms of depression and anxiety related to the grief over her husband passing a little over a month ago. Patient reports that she cries daily and struggles to find things to do in the evenings to assist her with staying busy. Patient states she has been having issues with her blood pressure likely related to her stress.   Mental Status Exam: Appearance:  Neat  Behavior: Appropriate  Motor: WNL  Speech/Language:  Clear and Coherent  Affect: Appropriate  Mood: Sad  Thought process: normal  Thought content:   WNL  Sensory/Perceptual disturbances:   WNL  Orientation: oriented to person, place, time/date, situation, day of week, month of year, and year  Attention: Good  Concentration: Good  Memory: WNL  Fund of knowledge:  Good  Insight:   Good  Judgment:  Good  Impulse Control: Good    Risk Assessment: Danger to Self:  No Self-injurious Behavior: No Danger to Others: No Duty to Warn:no Physical Aggression / Violence:No  Access to Firearms a concern: No  Gang Involvement:No    Subjective:    [name] [name] participated from office located at [facility] with Clinician present. [name] [name] consented to treatment.  [name] [name] presented for her session on time and looking very sad. Patient also had a bad cold and reports that she had gone to the beach with a friend and got sick. She reports that she is feeling a bit better. [name] [name] was tearful during her session and reports that she misses her husband and that she feels it has got worse. She reports that she had so many things to take care of in the early days after his passing and now she has more time on her hands. She states that being sick also reminds her of how nice it was to have  someone to check on you and take care of you. Patient reports that she continues to keep a schedule and walks with her friends daily and goes to work. Patient recognizes that this has helped her to cope a bit better.  Clinician provided support and encouragement via active listening and verbal feedback. Clinician processed patients grief with her and discussed with her the 5 stages of grief and how they will come and go. Clinician encouraged patient by letting her know that her feelings are normal and that some days will be harder than others. Clinician processed with patient the continued idea of finding something to get involved in to meet people and be busy. Patient states she plans on doing that especially since all of her friends are married and it would be nice to have friends who have more freedom to do things with. Patient states that she has considered volunteering with her church once she feels she is in a better place.   [name] [name] was fully engaged in discussion during session and evidenced motivation for treatment through her interaction and verbal feedback. [name] [name] will continue to engage in bi weekly therapy sessions. Treatment plan will be reviewed by 01/06/2024.     Interventions: Cognitive Behavioral Therapy, Motivational Interviewing and psycho education. Clinician conducted session in person at clinician's office at [facility]. Reviewed events since last session. Assessed patient's mood since last session and current mood. Clinician reviewed diagnoses and treatment recommendations. Provided psycho  education related to diagnoses and treatment.   Diagnosis: Generalized Anxiety Disorder   Keenan Pastor MSW, LCSW/DATE 12/20/2023

## 2023-12-26 ENCOUNTER — Ambulatory Visit
Admission: RE | Admit: 2023-12-26 | Discharge: 2023-12-26 | Disposition: A | Discharge status: Home / Self Care | Source: Ambulatory Visit | Attending: Emergency Medicine | Admitting: Emergency Medicine

## 2023-12-26 VITALS — BP 154/91 | HR 77 | Temp 97.7°F | Resp 17

## 2023-12-26 DIAGNOSIS — J069 Acute upper respiratory infection, unspecified: Secondary | ICD-10-CM | POA: Diagnosis not present

## 2023-12-26 DIAGNOSIS — J209 Acute bronchitis, unspecified: Secondary | ICD-10-CM

## 2023-12-26 MED ORDER — AZITHROMYCIN 250 MG PO TABS
ORAL_TABLET | ORAL | 0 refills | Status: DC
Start: 2023-12-26 — End: 2024-01-03

## 2023-12-26 MED ORDER — PREDNISONE 20 MG PO TABS: 40.0000 mg | ORAL_TABLET | Freq: Every day | ORAL | 0 refills | Status: AC

## 2023-12-26 NOTE — ED Provider Notes (Signed)
 [facility]    CSN: [id number] Arrival date & time: 12/26/23  9177      History   Chief Complaint Chief Complaint  Patient presents with   Cough    Cough and cold for 2 weeks - Entered by patient    HPI [name] [name] is a 68 y.o. female.   Patient presents to clinic over concerns of persistent cough for the past two weeks. Cough is productive with yellow/ green phlegm.  Does have a history of asthma, has not had to use her albuterol  inhaler for around a year but has been using it the past few days prior to exercise.  She is physically active, usually runs but due to an injury she has been walking, yesterday she walked 6 miles. Endorses mild wheezing and shortness of breath.   Some fatigue. Denies fever. Denies N/V/D. Denies congestion, ST or rhinorrhea.   Has been taking Tylenol  as needed.   The history is provided by the patient and medical records.  Cough   Past Medical History:  Diagnosis Date   Allergy    mild   Anxiety June 2024   Arthritis    Asthma    Cancer (HCC)    bladder cancer   Cirrhosis, alcoholic (HCC)    Esophageal varices (HCC)    GERD (gastroesophageal reflux disease)    Hypertension    Hyponatremia    Psoriasis     Patient Active Problem List   Diagnosis Date Noted   Dermatochalasis 11/16/2023   Atelectasis 08/14/2023   Cirrhosis (HCC) 08/14/2023   Insomnia 02/18/2023   Generalized anxiety disorder 01/06/2023   Routine general medical examination at a health care facility 08/04/2022   Encounter for counseling 06/02/2022   Right Achilles tendinitis 02/19/2022   Essential hypertension 09/09/2007   GERD 09/09/2007   Asthma 06/22/2007   Alcoholic cirrhosis of liver (HCC) 06/22/2007   PSORIASIS 06/22/2007    Past Surgical History:  Procedure Laterality Date   BASAL CELL CARCINOMA EXCISION Right    Ear   BLADDER SURGERY     Tumor removed    BREAST REDUCTION SURGERY     Bilateral    COLONOSCOPY     COSMETIC SURGERY   1992   REDUCTION MAMMAPLASTY     UPPER GASTROINTESTINAL ENDOSCOPY      OB History   No obstetric history on file.      Home Medications    Prior to Admission medications   Medication Sig Start Date End Date Taking? Authorizing Provider  azithromycin (ZITHROMAX Z-PAK) 250 MG tablet Take 2 tablets today and 1 tablet daily 12/26/23  Yes Marina Desire  N, FNP  predniSONE  (DELTASONE ) 20 MG tablet Take 2 tablets (40 mg total) by mouth daily with breakfast for 5 days. 12/26/23 12/31/23 Yes Avanna Sowder  N, FNP  albuterol  (VENTOLIN  HFA) 108 (90 Base) MCG/ACT inhaler Inhale 2 puffs into the lungs every 4 (four) hours as needed. Patient taking differently: Inhale 2 puffs into the lungs every 4 (four) hours as needed for wheezing or shortness of breath. 08/02/23   Rollene Almarie LABOR, MD  amLODipine  (NORVASC ) 5 MG tablet Take 1 tablet (5 mg total) by mouth daily. 10/29/23   Rollene Almarie LABOR, MD  b complex vitamins tablet Take 1 tablet by mouth daily.    [provider]  fluticasone  (FLONASE ) 50 MCG/ACT nasal spray Place 2 sprays into both nostrils daily. 08/02/23   Rollene Almarie LABOR, MD  furosemide  (LASIX ) 20 MG tablet Take 1 tablet (  20 mg total) by mouth daily. 10/28/23   Avram Lupita BRAVO, MD  omeprazole (PRILOSEC OTC) 20 MG tablet Take 20 mg by mouth daily.    [provider]  Prenatal Multivit-Min-Fe-FA (PRE-NATAL FORMULA) TABS Take 1 tablet by mouth daily.    [provider]  spironolactone  (ALDACTONE ) 25 MG tablet Take 1 tablet (25 mg total) by mouth daily. 10/28/23 01/27/24  Avram Lupita BRAVO, MD    Family History Family History  Problem Relation Age of Onset   Diabetes Paternal Grandmother    Diabetes Paternal Grandfather    Depression Mother    Alcohol abuse Brother    Cancer Brother    Early death Brother    Colon cancer Neg Hx    Colon polyps Neg Hx    Esophageal cancer Neg Hx    Rectal cancer Neg Hx    Stomach cancer Neg Hx     Social  History Social History   Tobacco Use   Smoking status: Never   Smokeless tobacco: Never  Vaping Use   Vaping status: Never Used  Substance Use Topics   Alcohol use: No   Drug use: No     Allergies   Lisinopril and Sulfamethoxazole-trimethoprim   Review of Systems Review of Systems  Per HPI  Physical Exam Triage Vital Signs ED Triage Vitals  Encounter Vitals Group     BP 12/26/23 0828 (!) 154/91     Girls Systolic BP Percentile --      Girls Diastolic BP Percentile --      Boys Systolic BP Percentile --      Boys Diastolic BP Percentile --      Pulse Rate 12/26/23 0828 77     Resp 12/26/23 0828 17     Temp 12/26/23 0828 97.7 F (36.5 C)     Temp src --      SpO2 12/26/23 0828 95 %     Weight --      Height --      Head Circumference --      Peak Flow --      Pain Score 12/26/23 0827 0     Pain Loc --      Pain Education --      Exclude from Growth Chart --    No data found.  Updated Vital Signs BP (!) 154/91 (BP Location: Right Arm)   Pulse 77   Temp 97.7 F (36.5 C)   Resp 17   SpO2 95%   Visual Acuity Right Eye Distance:   Left Eye Distance:   Bilateral Distance:    Right Eye Near:   Left Eye Near:    Bilateral Near:     Physical Exam Vitals and nursing note reviewed.  Constitutional:      Appearance: Normal appearance.  HENT:     Head: Normocephalic and atraumatic.     Right Ear: External ear normal.     Left Ear: External ear normal.     Nose: Nose normal.     Mouth/Throat:     Mouth: Mucous membranes are moist.     Pharynx: Posterior oropharyngeal erythema present.   Eyes:     Conjunctiva/sclera: Conjunctivae normal.    Cardiovascular:     Rate and Rhythm: Normal rate and regular rhythm.     Heart sounds: Normal heart sounds. No murmur heard. Pulmonary:     Effort: Pulmonary effort is normal. No respiratory distress.     Breath sounds: Normal breath sounds.  Skin:    General: Skin is warm and dry.   Neurological:      General: No focal deficit present.     Mental Status: She is alert.   Psychiatric:        Mood and Affect: Mood normal.      UC Treatments / Results  Labs (all labs ordered are listed, but only abnormal results are displayed) Labs Reviewed - No data to display  EKG   Radiology No results found.  Procedures Procedures (including critical care time)  Medications Ordered in UC Medications - No data to display  Initial Impression / Assessment and Plan / UC Course  I have reviewed the triage vital signs and the nursing notes.  Pertinent labs & imaging results that were available during my care of the patient were reviewed by me and considered in my medical decision making (see chart for details).  Vitals and triage reviewed, patient is hemodynamically stable.  Lungs vesicular, heart with RRR.  Postnasal drip and posterior pharynx erythema present.  Symptoms consistent with acute bronchitis, will treat with azithromycin and prednisone  burst.  Could be related to mild asthma exacerbation, encouraged albuterol  inhaler use as needed.   Plan of care, follow-up care and return precautions given, no questions at this time.    Final Clinical Impressions(s) / UC Diagnoses   Final diagnoses:  Upper respiratory tract infection, unspecified type  Acute bronchitis, unspecified organism     Discharge Instructions      Take the antibiotics as prescribed, take the prednisone  daily with breakfast.  Continue manage albuterol  inhaler every 4-6 hours as needed for any wheezing or shortness of breath.  Symptoms should improve over the next few days, if no improvement or changes follow-up with your primary care provider or return to clinic for reevaluation.    ED Prescriptions     Medication Sig Dispense Auth. Provider   azithromycin (ZITHROMAX Z-PAK) 250 MG tablet Take 2 tablets today and 1 tablet daily 6 tablet Dreama, Arnie Maiolo  N, FNP   predniSONE  (DELTASONE ) 20 MG tablet Take 2  tablets (40 mg total) by mouth daily with breakfast for 5 days. 10 tablet Dreama, Ren Grasse  N, FNP      PDMP not reviewed this encounter.   Dreama, Girtrude Enslin  N, FNP 12/26/23 [phone]

## 2023-12-26 NOTE — Discharge Instructions (Signed)
 Take the antibiotics as prescribed, take the prednisone  daily with breakfast.  Continue manage albuterol  inhaler every 4-6 hours as needed for any wheezing or shortness of breath.  Symptoms should improve over the next few days, if no improvement or changes follow-up with your primary care provider or return to clinic for reevaluation.

## 2023-12-26 NOTE — ED Triage Notes (Signed)
 Pt c/o cough for 2 weeks. ?

## 2023-12-27 DIAGNOSIS — H02831 Dermatochalasis of right upper eyelid: Secondary | ICD-10-CM | POA: Diagnosis not present

## 2023-12-27 DIAGNOSIS — H02834 Dermatochalasis of left upper eyelid: Secondary | ICD-10-CM | POA: Diagnosis not present

## 2023-12-31 ENCOUNTER — Telehealth: Admitting: Plastic Surgery

## 2024-01-02 ENCOUNTER — Encounter: Payer: Self-pay | Admitting: Internal Medicine

## 2024-01-03 ENCOUNTER — Ambulatory Visit (INDEPENDENT_AMBULATORY_CARE_PROVIDER_SITE_OTHER): Admitting: Licensed Clinical Social Worker

## 2024-01-03 ENCOUNTER — Other Ambulatory Visit (HOSPITAL_BASED_OUTPATIENT_CLINIC_OR_DEPARTMENT_OTHER): Payer: Self-pay

## 2024-01-03 ENCOUNTER — Other Ambulatory Visit: Payer: Self-pay

## 2024-01-03 DIAGNOSIS — F411 Generalized anxiety disorder: Secondary | ICD-10-CM

## 2024-01-03 MED ORDER — AMLODIPINE BESYLATE 10 MG PO TABS
10.0000 mg | ORAL_TABLET | Freq: Every day | ORAL | 3 refills | Status: AC
  Filled 2024-01-03: qty 90, 90d supply, fill #0
  Filled 2024-03-27: qty 90, 90d supply, fill #1
  Filled 2024-06-20: qty 90, 90d supply, fill #2

## 2024-01-03 NOTE — Progress Notes (Signed)
 [facility] Counselor/Therapist Progress Note  Patient ID: [name] [name], MRN: [id number]    Date: 01/03/24  Time Spent: 0800  am - 0901 am : 61 Minutes  Treatment Type: Individual Therapy.  Reported Symptoms: Fanta presented with symptoms of depression and anxiety related to the grief over her husband passing a little over a month ago. Patient reports that she cries daily and struggles to find things to do in the evenings to assist her with staying busy. Patient states she has been having issues with her blood pressure likely related to her stress.   Mental Status Exam: Appearance:  Neat  Behavior: Appropriate  Motor: WNL  Speech/Language:  Clear and Coherent  Affect: Appropriate  Mood: Sad  Thought process: normal  Thought content:   WNL  Sensory/Perceptual disturbances:   WNL  Orientation: oriented to person, place, time/date, situation, day of week, month of year, and year  Attention: Good  Concentration: Good  Memory: WNL  Fund of knowledge:  Good  Insight:   Good  Judgment:  Good  Impulse Control: Good    Risk Assessment: Danger to Self:  No Self-injurious Behavior: No Danger to Others: No Duty to Warn:no Physical Aggression / Violence:No  Access to Firearms a concern: No  Gang Involvement:No    Subjective:    [name] participated from office located at [facility] with Clinician present. Jennings consented to treatment.  Merie presented for her session appearing well put together as always and pleasant and cooperative. Giuliana reports that she continues to go with her walking groups and is active with her physical health. She reports that she plans to join the gym at [employer] for when she is feeling alone. She reports that she find herself feeling lonely still and patient was tearful as she expressed the relationship she shared with her husband. She states that all of her local friends are married with families and she doesn't have anyone  to spend time with on weekends and special ocassion's. Patient reported that she dreads the upcoming weekend and is hoping that it passes quickly.  Clinician actively listened and patient shared her grief and hurt. Clinician provided positive and encouraging feedback during session. Clinician and patient processed ideas for meeting people and church was a common theme. Patient reports that she attends a large church and she is considering joining a Occupational hygienist for volunteering. Patient was very much against joining a grief support group. Patient also shared that Hospice was not helpful and is likely not going to be interested if they were to reach out at this point. Clinician encouraged patient to speak with her friends and to be honest about her feelings. Clinician reminded patient that it's okay to not be okay and to be transparent with her social group. Clinician reminded her that they cannot support in a way that she needs if they aren't aware of where she is struggling.  Brookelynne continues to be resilient in many ways throughout her daily life. Madalen is physically active and is insightful of how physical fitness improves both her mental and physical well being. Lyn is very driven and is motivated for treatment. Clarke is polite and cooperative and fully engaged during session in problem solving. Shay will continue to attend bi weekly therapy sessions. Treatment planning to be reviewed by 01/06/2024.   Interventions: Cognitive Behavioral Therapy, Motivational Interviewing and psycho education. Clinician conducted session in person at clinician's office at [facility]. Reviewed events since last session. Assessed patient's mood  since last session and current mood. Clinician reviewed diagnoses and treatment recommendations. Provided psycho education related to diagnoses and treatment. Treatment planning to be reviewed by January 06, 2024.  Diagnosis: Generalized Anxiety Disorder   Damien Junk MSW,  LCSW/DATE 01/03/2024

## 2024-01-03 NOTE — Telephone Encounter (Signed)
 Please advise if appropriate.

## 2024-01-04 ENCOUNTER — Ambulatory Visit: Admitting: Plastic Surgery

## 2024-01-04 DIAGNOSIS — H02834 Dermatochalasis of left upper eyelid: Secondary | ICD-10-CM

## 2024-01-04 DIAGNOSIS — H02831 Dermatochalasis of right upper eyelid: Secondary | ICD-10-CM

## 2024-01-04 IMAGING — MR MR BRAIN/TEMPORAL BONE/IAC
11 of 12 series · 38 of 48 positions shown · IV contrast (multihance)
Comparison: MRI head October 06, 2016.

CLINICAL DATA: Sudden hearing loss, unspecified laterality
([id number])

EXAM:
MRI HEAD WITHOUT AND WITH CONTRAST
TECHNIQUE: Multiplanar, multiecho pulse sequences of the brain and surrounding
structures were obtained without and with intravenous contrast.
CONTRAST:  13mL MULTIHANCE GADOBENATE DIMEGLUMINE 529 MG/ML IV SOLN

[Series 2: T1 · sagittal · 5.0mm · 0.45mm/px · 3 of 25 slices shown (1 of 4)]
[im 1/25]
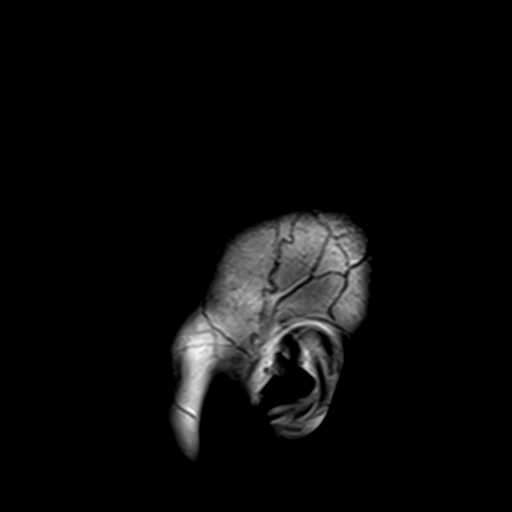
[im 13/25]
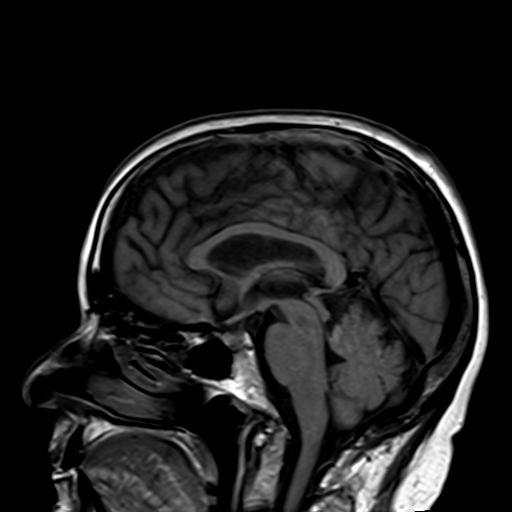
[im 25/25]
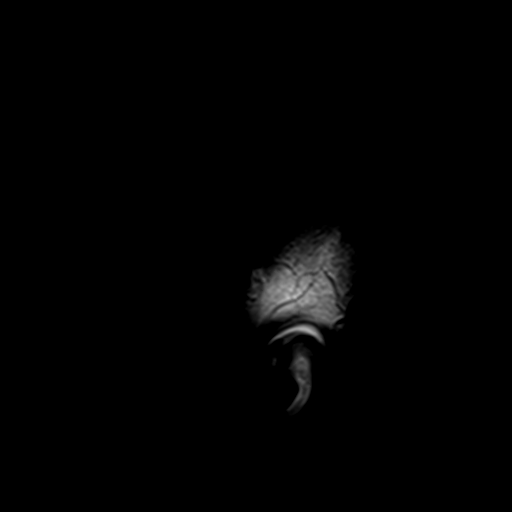

[Series 3: ep2d_diff_3 · axial · 3.0mm · 1.80mm/px · z∈[-86,+62]mm · 8 of 100 slices shown]
[im 1/100]
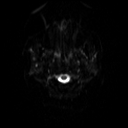
[im 12/100]
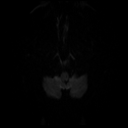
[im 34/100]
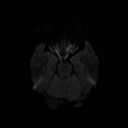
[im 45/100]
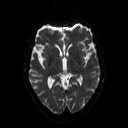
[im 56/100]
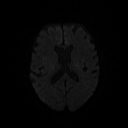
[im 67/100]
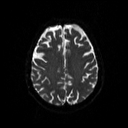
[im 89/100]
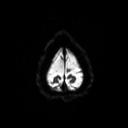
[im 100/100]
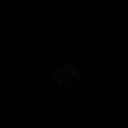

[Series 4: ep2d_diff_3_adc · axial · 3.0mm · 1.80mm/px · z∈[-86,+62]mm · 5 of 49 slices shown]
[im 1/49]
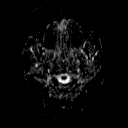
[im 13/49]
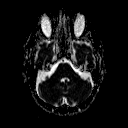
[im 25/49]
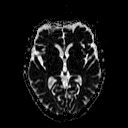
[im 37/49]
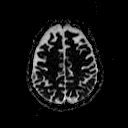
[im 49/49]
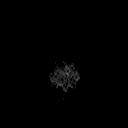

[Series 5: t2_tse_tra_512 · axial · 5.0mm · 0.60mm/px · z∈[-84,+63]mm · 3 of 25 slices shown]
[im 1/25]
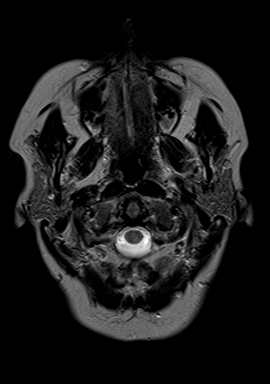
[im 13/25]
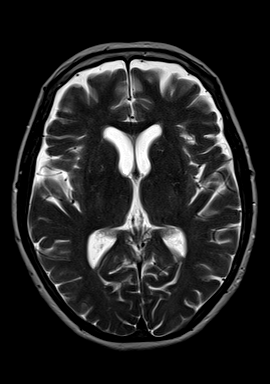
[im 25/25]
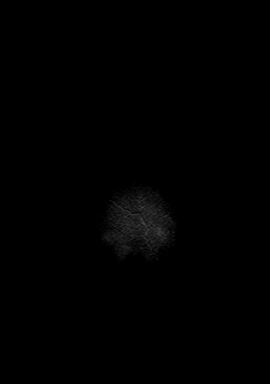

[Series 6: FLAIR · axial · 3.0mm · 0.43mm/px · z∈[-86,+62]mm · 3 of 26 slices shown]
[im 1/26]
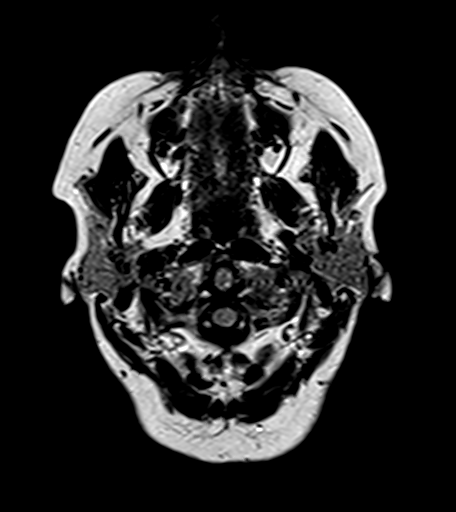
[im 13/26]
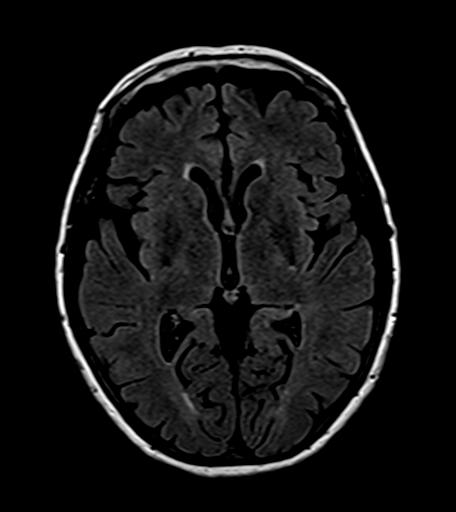
[im 26/26]
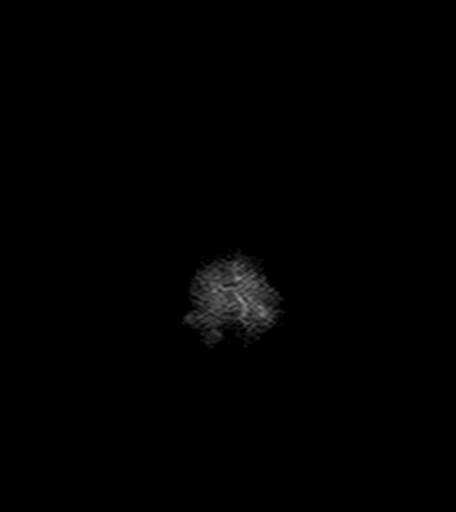

[Series 8: swi_images · axial · 2.0mm · 0.90mm/px · z∈[-88,+67]mm · 8 of 80 slices shown]
[im 1/80]
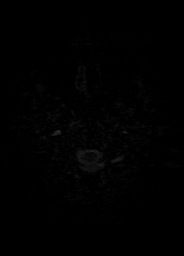
[im 12/80]
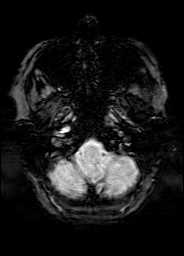
[im 23/80]
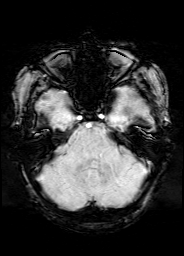
[im 34/80]
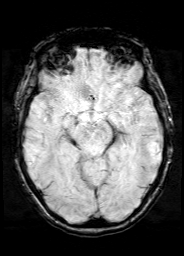
[im 46/80]
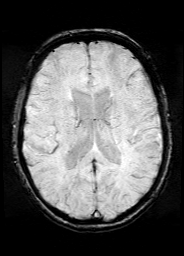
[im 57/80]
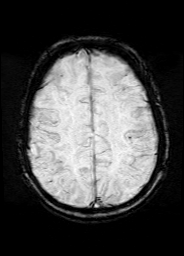
[im 68/80]
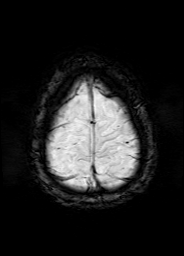
[im 80/80]
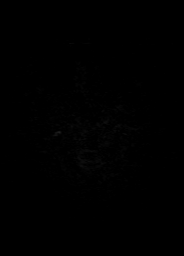

[Series 9: T1 · coronal · 3.0mm · 0.35mm/px · 1 of 11 slices shown (2 of 4)]
[im 1/11]
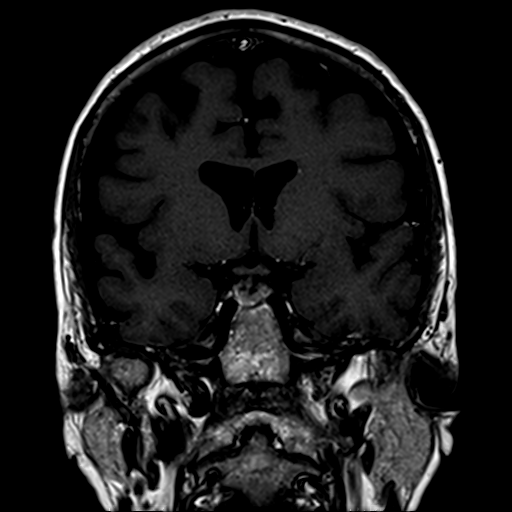

[Series 10: T1 · axial · 3.0mm · 0.35mm/px · 1 of 11 slices shown (3 of 4)]
[im 1/11]
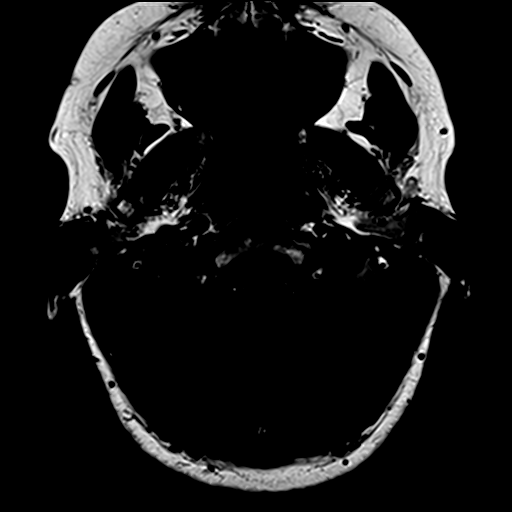

[Series 11: bSSFP · axial · 0.7mm · 0.28mm/px · z∈[-89,-59]mm · 4 of 44 slices shown]
[im 1/44]
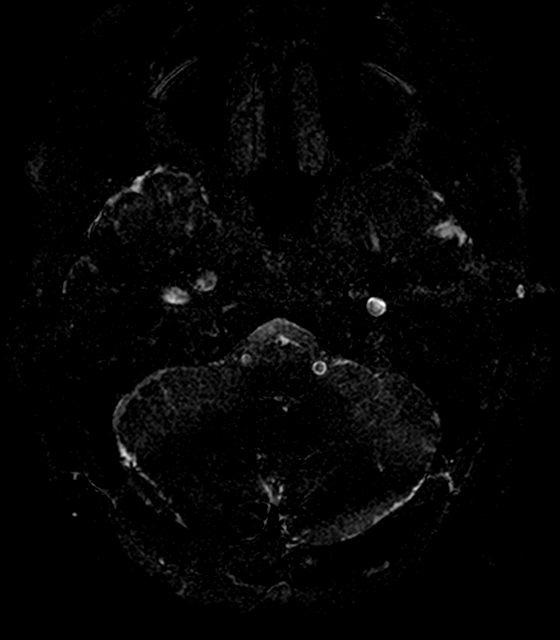
[im 15/44]
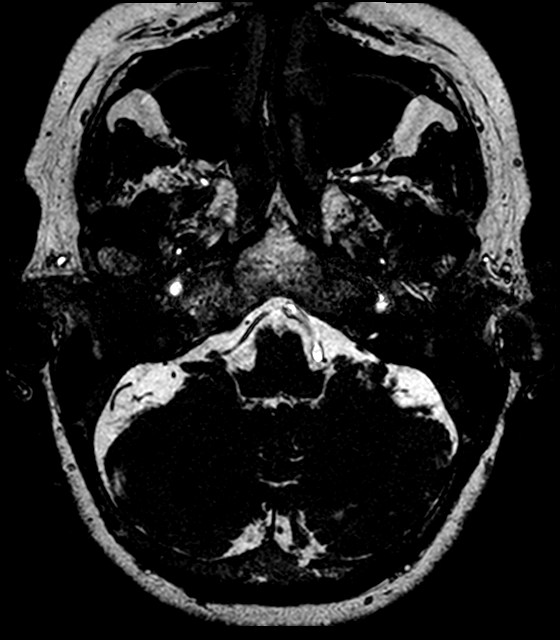
[im 29/44]
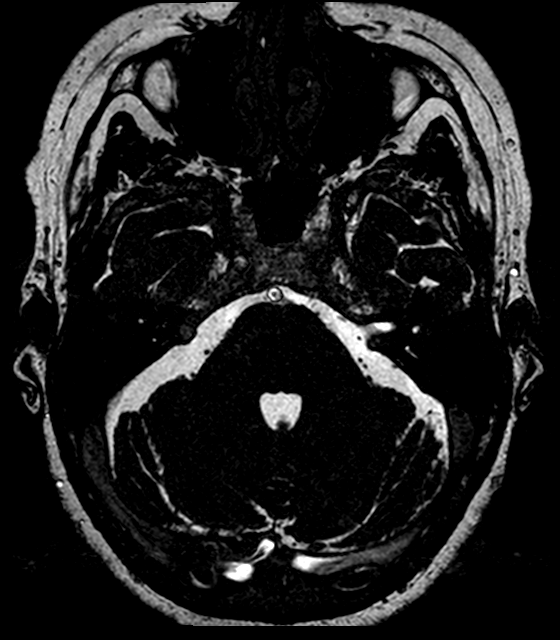
[im 44/44]
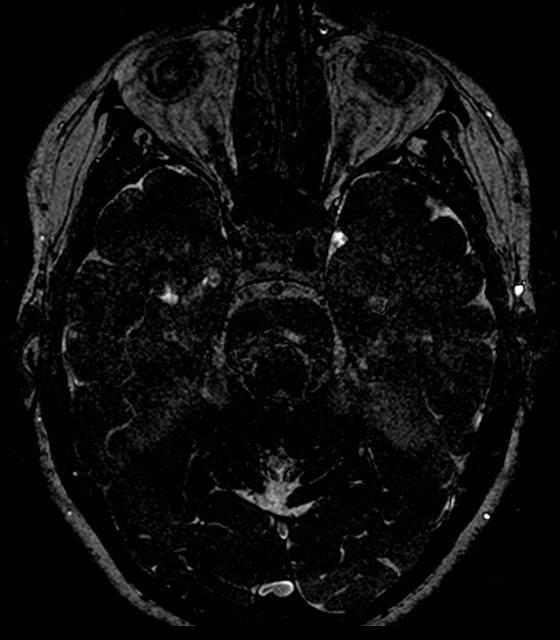

[Series 12: T1 · coronal · 3.0mm · 0.35mm/px · 1 of 11 slices shown (4 of 4)]
[im 1/11]
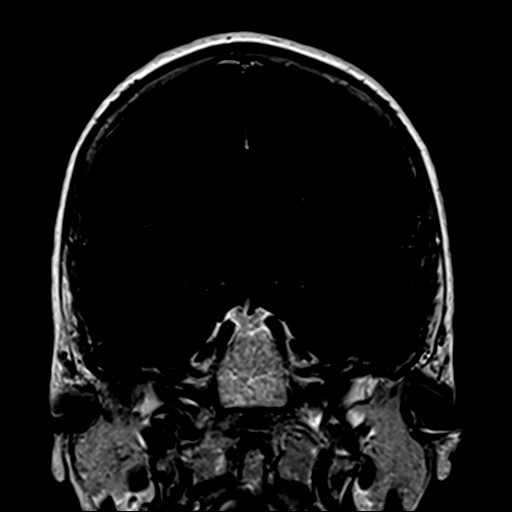

[Series 13: T1 post-contrast · axial · 3.0mm · 0.35mm/px · 1 of 11 slices shown]
[im 1/11]
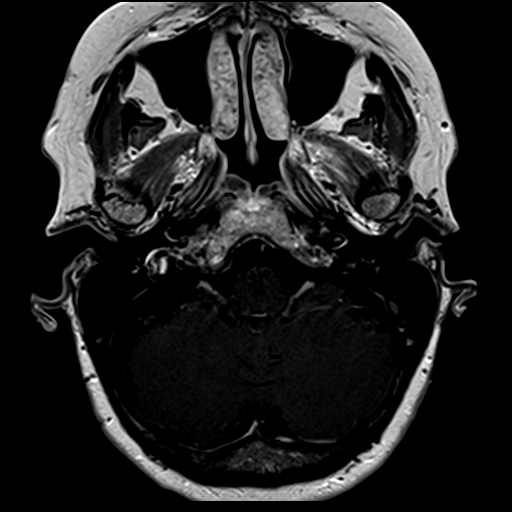

[38 of 48 positions shown; findings below may reference images not displayed]

FINDINGS: Brain: No acute infarction, hemorrhage, hydrocephalus, extra-axial
collection or mass lesion. Mild atrophy.

Dedicated IAC imaging was performed unremarkable appearance of the
visualized seventh and eighth cranial nerves bilaterally. No
convincing evidence of abnormal enhancement or retrocochlear mass.
Normal CSF signal within the labyrinth bilaterally. No mastoid
effusions.

Vascular: Major arterial flow voids are maintained at the skull
base.

Skull and upper cervical spine: Normal marrow signal.

No mastoid effusions.

Sinuses/Orbits: Clear sinuses.  Unremarkable orbits.
IMPRESSION: No evidence of acute intracranial abnormality or retrocochlear mass.

## 2024-01-04 NOTE — Progress Notes (Signed)
   Subjective:    Patient ID: [name] [name], female    DOB: [birth year], 68 y.o.   MRN: [id number]  HPI    Review of Systems     Objective:   Physical Exam        Assessment & Plan:

## 2024-01-10 ENCOUNTER — Encounter: Payer: Self-pay | Admitting: Plastic Surgery

## 2024-01-10 ENCOUNTER — Ambulatory Visit: Admitting: Plastic Surgery

## 2024-01-10 DIAGNOSIS — H02831 Dermatochalasis of right upper eyelid: Secondary | ICD-10-CM

## 2024-01-10 DIAGNOSIS — H02834 Dermatochalasis of left upper eyelid: Secondary | ICD-10-CM

## 2024-01-11 NOTE — H&P (View-Only) (Signed)
 I connected with  [name] [name] on 01/11/24 by phone and verified that I am speaking with the correct person using two identifiers.  The patient was at work here in [place]  and I was at the office.  We spent 2 minutes in discussion.  We are trying to get the results of her visual field exam so that we can submit this to insurance.  I have asked [name] [name] to schedule for another televisit and to try and get the visual field exam results for us .   I discussed the limitations of evaluation and management by telemedicine. The patient expressed understanding and agreed to proceed.  7/10 We were able get the report and the patient is a good candidate for blepharoplasty.

## 2024-01-11 NOTE — Progress Notes (Signed)
 I connected with  [name] on 01/11/24 by phone and verified that I am speaking with the correct person using two identifiers.  The patient was at work here in [place]  and I was at the office.  We spent 2 minutes in discussion.  We are trying to get the results of her visual field exam so that we can submit this to insurance.  I have asked Alicia to schedule for another televisit and to try and get the visual field exam results for us .   I discussed the limitations of evaluation and management by telemedicine. The patient expressed understanding and agreed to proceed.  7/10 We were able get the report and the patient is a good candidate for blepharoplasty.

## 2024-01-11 NOTE — Progress Notes (Signed)
 Left message for patient

## 2024-01-13 NOTE — Addendum Note (Signed)
 Addended by: LOWERY ESTEFANA RAMAN on: 01/13/2024 08:30 AM   Modules accepted: Orders

## 2024-01-24 ENCOUNTER — Ambulatory Visit: Admitting: Licensed Clinical Social Worker

## 2024-01-24 NOTE — Progress Notes (Signed)
 Patient ID: [name] [name], female    DOB: 03/31/1956, 68 y.o.   MRN: [id number]  Chief Complaint  Patient presents with   Pre-op Exam      ICD-10-CM   1. Dermatochalasis of both upper eyelids  H02.831    H02.834        History of Present Illness: [name] [name] is a 68 y.o.  female  with a history of dermatochalasis.  She presents for preoperative evaluation for upcoming procedure, bilateral upper eyelid blepharoplasty, scheduled for 02/09/2024 with Dr. Lowery.  The patient has not had problems with anesthesia.  She denies any personal or family history of blood clots or clotting disorder.  She denies any personal history of cardiac or pulmonary disease, MI/CVA, varicosities, or nicotine use disorder.  She does have cirrhosis, but stable.  Patient has not required paracentesis in over a decade.  She also has a history of bladder cancer, but treated and in remission.  Her asthma is also well-controlled and typically only provoked by viral illness.  She has a very active lifestyle and would like to resume exercise shortly after surgery.  Summary of Previous Visit: Patient was seen for initial consult 11/16/2023 by Dr. Lowery.  At that time, she was deemed a good candidate for upper eyelid blepharoplasty.  She was also noted to have hyperpigmentation and BBL/Moxi was discussed.  Job: [employer], does not require FMLA/STD.  She reports that she will be able to work from home.  PMH Significant for: Dermatochalasis, cirrhosis, asthma, HTN, GERD.  Per gastroenterologist encounter 2025, her episode of cholecystitis evidently resolved and her cirrhosis is stable.  No thrombocytopenia.   Past Medical History: Allergies: Allergies  Allergen Reactions   Lisinopril Other (See Comments)    Facial swelling  Prinivil   Sulfamethoxazole-Trimethoprim Hives    REACTION: unspecified    Current Medications:  Current Outpatient Medications:    albuterol  (VENTOLIN  HFA) 108 (90 Base)  MCG/ACT inhaler, Inhale 2 puffs into the lungs every 4 (four) hours as needed. (Patient taking differently: Inhale 2 puffs into the lungs every 4 (four) hours as needed for wheezing or shortness of breath.), Disp: 6.7 g, Rfl: 5   amLODipine  (NORVASC ) 10 MG tablet, Take 1 tablet (10 mg total) by mouth daily., Disp: 90 tablet, Rfl: 3   b complex vitamins tablet, Take 1 tablet by mouth daily., Disp: , Rfl:    cephALEXin  (KEFLEX ) 500 MG capsule, Take 1 capsule (500 mg total) by mouth 4 (four) times daily for 3 days., Disp: 12 capsule, Rfl: 0   fluticasone  (FLONASE ) 50 MCG/ACT nasal spray, Place 2 sprays into both nostrils daily., Disp: 48 g, Rfl: 3   furosemide  (LASIX ) 20 MG tablet, Take 1 tablet (20 mg total) by mouth daily., Disp: 90 tablet, Rfl: 3   omeprazole (PRILOSEC OTC) 20 MG tablet, Take 20 mg by mouth daily., Disp: , Rfl:    ondansetron  (ZOFRAN -ODT) 4 MG disintegrating tablet, Take 1 tablet (4 mg total) by mouth every 8 (eight) hours as needed for nausea or vomiting., Disp: 20 tablet, Rfl: 0   oxyCODONE  (ROXICODONE ) 5 MG immediate release tablet, Take 1 tablet (5 mg total) by mouth every 8 (eight) hours as needed for up to 3 days for severe pain (pain score 7-10)., Disp: 4 tablet, Rfl: 0   Prenatal Multivit-Min-Fe-FA (PRE-NATAL FORMULA) TABS, Take 1 tablet by mouth daily., Disp: , Rfl:    spironolactone  (ALDACTONE ) 25 MG tablet, Take 1 tablet (25 mg total) by  mouth daily., Disp: 90 tablet, Rfl: 3  Past Medical Problems: Past Medical History:  Diagnosis Date   Allergy    mild   Anxiety June 2024   Arthritis    Asthma    Cancer (HCC)    bladder cancer   Cirrhosis, alcoholic (HCC)    Esophageal varices (HCC)    GERD (gastroesophageal reflux disease)    Hypertension    Hyponatremia    Psoriasis     Past Surgical History: Past Surgical History:  Procedure Laterality Date   BASAL CELL CARCINOMA EXCISION Right    Ear   BLADDER SURGERY     Tumor removed    BREAST REDUCTION SURGERY      Bilateral    COLONOSCOPY     COSMETIC SURGERY  1992   REDUCTION MAMMAPLASTY     UPPER GASTROINTESTINAL ENDOSCOPY      Social History: Social History   Socioeconomic History   Marital status: Widowed    Spouse name: Not on file   Number of children: Not on file   Years of education: Not on file   Highest education level: Bachelor's degree (e.g., BA, AB, BS)  Occupational History   Not on file  Tobacco Use   Smoking status: Never   Smokeless tobacco: Never  Vaping Use   Vaping status: Never Used  Substance and Sexual Activity   Alcohol use: No   Drug use: No   Sexual activity: Not Currently    Birth control/protection: None  Other Topics Concern   Not on file  Social History Narrative   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[employer]   Married-husband in hospice due to alcoholic liver disease 2025   Sober   Never smoker never drug use   No children   Social Drivers of Corporate investment banker Strain: Low Risk  (07/30/2023)   Overall Financial Resource Strain (CARDIA)    Difficulty of Paying Living Expenses: Not hard at all  Food Insecurity: No Food Insecurity (08/14/2023)   Hunger Vital Sign    Worried About Running Out of Food in the Last Year: Never true    Ran Out of Food in the Last Year: Never true  Transportation Needs: No Transportation Needs (08/14/2023)   PRAPARE - Administrator, Civil Service (Medical): No    Lack of Transportation (Non-Medical): No  Physical Activity: Sufficiently Active (07/30/2023)   Exercise Vital Sign    Days of Exercise per Week: 6 days    Minutes of Exercise per Session: 40 min  Stress: Stress Concern Present (07/30/2023)   [employer] of Occupational Health - Occupational Stress Questionnaire    Feeling of Stress : To some extent  Social Connections: Socially Integrated (08/14/2023)   Social Connection and Isolation Panel    Frequency of Communication with Friends and Family: Three times a week    Frequency of Social  Gatherings with Friends and Family: Three times a week    Attends Religious Services: More than 4 times per year    Active Member of Clubs or Organizations: No    Attends Banker Meetings: 1 to 4 times per year    Marital Status: Married  Catering manager Violence: Not At Risk (08/14/2023)   Humiliation, Afraid, Rape, and Kick questionnaire    Fear of Current or Ex-Partner: No    Emotionally Abused: No    Physically Abused: No    Sexually Abused: No    Family History: Family History  Problem Relation Age  of Onset   Diabetes Paternal Grandmother    Diabetes Paternal Grandfather    Depression Mother    Alcohol abuse Brother    Cancer Brother    Early death Brother    Colon cancer Neg Hx    Colon polyps Neg Hx    Esophageal cancer Neg Hx    Rectal cancer Neg Hx    Stomach cancer Neg Hx     Review of Systems: ROS She denies any chest pain, fevers, leg swelling, or difficulty breathing.  Physical Exam: Vital Signs BP (!) 149/95 (BP Location: Left Arm, Patient Position: Sitting, Cuff Size: Normal)   Pulse 80   Ht 5' 3 (1.6 m)   Wt 139 lb 6.4 oz (63.2 kg)   SpO2 96%   BMI 24.69 kg/m   Physical Exam Constitutional:      General: Not in acute distress.    Appearance: Normal appearance. Not ill-appearing.  HENT:     Head: Normocephalic and atraumatic.  Eyes:     Pupils: Pupils are equal, round. Cardiovascular:     Rate and Rhythm: Normal rate.    Pulses: Normal pulses.  Pulmonary:     Effort: No respiratory distress or increased work of breathing.  Speaks in full sentences. Abdominal:     General: Abdomen is flat. No distension.   Musculoskeletal: Normal range of motion. No lower extremity swelling or edema. No varicosities. Skin:    General: Skin is warm and dry.     Findings: No erythema or rash.  Neurological:     Mental Status: Alert and oriented to person, place, and time.  Psychiatric:        Mood and Affect: Mood normal.        Behavior:  Behavior normal.    Assessment/Plan: The patient is scheduled for upper blepharoplasty with Dr. Lowery.  Risks, benefits, and alternatives of procedure discussed, questions answered and consent obtained.    Smoking Status: Non-smoker.  Caprini Score: 6; Risk Factors include: Age, history of cancer, and length of planned surgery. Recommendation for mechanical prophylaxis. Encourage early ambulation and compression stockings.  Pictures obtained: 11/16/2023  Post-op Rx sent to pharmacy: Keflex , oxycodone , Zofran   Patient was provided with the General Surgical Risk consent document and Pain Medication Agreement prior to their appointment.  They had adequate time to read through the risk consent documents and Pain Medication Agreement. We also discussed them in person together during this preop appointment. All of their questions were answered to their satisfaction.  Recommended calling if they have any further questions.  Risk consent form and Pain Medication Agreement to be scanned into patient's chart.  The risks that can be encountered with and after a blepharoplasty were discussed and include the following but no limited to these:  Asymmetry, dry eyes, lid lag, sensitivity to sun or bright light, difficulty closing your eyes, outward rolling of the eyelid, change in vision, fluid accumulation, firmness of the area, fat necrosis with death of fat tissue, bleeding, infection, delayed healing, anesthesia risks, skin sensation changes, injury to structures including nerves, blood vessels, and muscles which may be temporary or permanent, hair loss, allergies to tape, suture materials and glues, blood products, topical preparations or injected agents, skin and contour irregularities, skin discoloration and swelling, deep vein thrombosis, cardiac and pulmonary complications, pain, which may persist, persistent pain, recurrence, poor healing of the incision, possible need for revisional surgery or staged  procedures. Thiere can also be persistent swelling, poor wound healing, rippling or loose  skin, swelling. Any change in weight fluctuations can alter the outcome.    Electronically signed by: Honora Seip, PA-C 01/25/2024 9:49 AM

## 2024-01-25 ENCOUNTER — Encounter: Payer: Self-pay | Admitting: Physician Assistant

## 2024-01-25 ENCOUNTER — Ambulatory Visit (INDEPENDENT_AMBULATORY_CARE_PROVIDER_SITE_OTHER): Admitting: Physician Assistant

## 2024-01-25 ENCOUNTER — Other Ambulatory Visit (HOSPITAL_BASED_OUTPATIENT_CLINIC_OR_DEPARTMENT_OTHER): Payer: Self-pay

## 2024-01-25 VITALS — BP 149/95 | HR 80 | Ht 63.0 in | Wt 139.4 lb

## 2024-01-25 DIAGNOSIS — H02834 Dermatochalasis of left upper eyelid: Secondary | ICD-10-CM

## 2024-01-25 DIAGNOSIS — H02831 Dermatochalasis of right upper eyelid: Secondary | ICD-10-CM

## 2024-01-25 MED ORDER — ONDANSETRON 4 MG PO TBDP
4.0000 mg | ORAL_TABLET | Freq: Three times a day (TID) | ORAL | 0 refills | Status: DC | PRN
  Filled 2024-01-25: qty 20, 7d supply, fill #0

## 2024-01-25 MED ORDER — CEPHALEXIN 500 MG PO CAPS
500.0000 mg | ORAL_CAPSULE | Freq: Four times a day (QID) | ORAL | 0 refills | Status: AC
  Filled 2024-01-25: qty 12, 3d supply, fill #0

## 2024-01-25 MED ORDER — OXYCODONE HCL 5 MG PO TABS
5.0000 mg | ORAL_TABLET | Freq: Three times a day (TID) | ORAL | 0 refills | Status: AC | PRN
  Filled 2024-01-25: qty 4, 2d supply, fill #0

## 2024-02-01 ENCOUNTER — Ambulatory Visit (INDEPENDENT_AMBULATORY_CARE_PROVIDER_SITE_OTHER): Admitting: Licensed Clinical Social Worker

## 2024-02-01 DIAGNOSIS — F411 Generalized anxiety disorder: Secondary | ICD-10-CM | POA: Diagnosis not present

## 2024-02-01 NOTE — Progress Notes (Signed)
 [facility] Counselor/Therapist Progress Note  Patient ID: [name] [name], MRN: [id number]    Date: 02/01/24  Time Spent: 0800  am - 0900 am : 60 Minutes  Treatment Type: Individual Therapy.  Reported Symptoms: [name] [name] presented with symptoms of depression and anxiety related to the grief over her husband passing a little over a month ago. Patient reports that she cries daily and struggles to find things to do in the evenings to assist her with staying busy. Patient states she has been having issues with her blood pressure likely related to her stress.   Mental Status Exam: Appearance:  Neat  Behavior: Appropriate  Motor: WNL  Speech/Language:  Clear and Coherent  Affect: Appropriate  Mood: Sad  Thought process: normal  Thought content:   WNL  Sensory/Perceptual disturbances:   WNL  Orientation: oriented to person, place, time/date, situation, day of week, month of year, and year  Attention: Good  Concentration: Good  Memory: WNL  Fund of knowledge:  Good  Insight:   Good  Judgment:  Good  Impulse Control: Good    Risk Assessment: Danger to Self:  No Self-injurious Behavior: No Danger to Others: No Duty to Warn:no Physical Aggression / Violence:No  Access to Firearms a concern: No  Gang Involvement:No    Subjective:    [name] [name] participated from office located at [facility] with Clinician present. [name] [name] consented to treatment.  [name] [name] presented for her session in a positive mood. She shared details of her recent trip to [place]  with her friend. She reported it being more a like she was a tourist rather than she was going back to her home. She reports that she did see her sister briefly and a few other family members but she attempted to just relax and enjoy the trip. She reported that she realizes it was much needed to break the routine. [name] [name] reports that she has a few other trips planned this fall and winter to help her have something to  look forward to. [name] [name] reports that she still struggles with loneliness and realizes she needs to find additional activities to fill up some of her time. She reports that she has volunteered to work at the [employer] this weekend and she is looking forward to that. She reports that she remains active and ran a race recently to support the [employer] which she enjoyed.    Clinician actively listened and encouraged patient via verbal feedback. Clinician processed with patient the importance of allowing herself to grieve her husbands death and the new normal she has to build for herself. Clinician processed with patient her concerns and encouraged patient to continue to foster relationships with family and friends, as well as continuing to remain active and plan activities.   [name] [name] was active and fully engaged in discussion with Clinician. [name] [name] was motivated for treatment and continues to utilize coping skills and personal insight to assist with coping with both her anxiety and her grief. [name] [name] will continue to engage in bi weekly therapy sessions. Treatment planning to be reviewed by 01/31/2025.  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 She is very determined and self-motivated to improve her quality of life. Support System: Sister      Hospital doctor  Preferences Cognitive Behavioral Therapy   Client Statement of Needs       PT was tearful and identified her feelings of loneliness since her husbands passing in early Spring. Patient continues to work on building a new normal for her life without her spouse of over 50 years.   Treatment Level Every other week    Symptoms: Depression and anxiety   Goals: Improve coping skills to deal with grief   Target Date: 01/31/2025 Frequency: weekly  Progress: 0 Modality: individual      Therapist will provide referrals for additional resources as appropriate.  Therapist will provide psycho-education regarding anxiety and depression related to her grief  diagnosis and corresponding treatment approaches and interventions. Licensed Clinical Social Worker, Damien Junk, LCSW will support the patient's ability to achieve the goals identified. will employ CBT, BA, Problem-solving, Solution Focused, Mindfulness,  coping skills, & other evidenced-based practices will be used to promote progress towards healthy functioning to help manage decrease symptoms associated with her diagnosis.   Reduce overall level, frequency, and intensity of the feelings of depression, anxiety and panic evidenced by decreased from 6 to 7 days/week to 0 to 1 days/week per client report for at least 3 consecutive months. Verbally express understanding of the relationship between feelings of depression, anxiety and their impact on thinking patterns and behaviors. Verbalize an understanding of the role that distorted thinking plays in creating fears, excessive worry, and ruminations.            [name] [name]  participated in the creation of the treatment plan.   Damien Junk MSW, LCSW DATE:02/01/2024         Damien CHRISTELLA Junk MSW, LCSW DATE:02/01/2024

## 2024-02-02 ENCOUNTER — Encounter (HOSPITAL_BASED_OUTPATIENT_CLINIC_OR_DEPARTMENT_OTHER): Payer: Self-pay | Admitting: Plastic Surgery

## 2024-02-02 ENCOUNTER — Encounter: Payer: Self-pay | Admitting: Plastic Surgery

## 2024-02-02 ENCOUNTER — Other Ambulatory Visit: Payer: Self-pay

## 2024-02-03 ENCOUNTER — Encounter (HOSPITAL_BASED_OUTPATIENT_CLINIC_OR_DEPARTMENT_OTHER)
Admission: RE | Admit: 2024-02-03 | Discharge: 2024-02-03 | Disposition: A | Discharge status: Home / Self Care | Source: Ambulatory Visit | Attending: Plastic Surgery | Admitting: Plastic Surgery

## 2024-02-03 DIAGNOSIS — Z01812 Encounter for preprocedural laboratory examination: Secondary | ICD-10-CM | POA: Insufficient documentation

## 2024-02-03 DIAGNOSIS — I10 Essential (primary) hypertension: Secondary | ICD-10-CM | POA: Diagnosis not present

## 2024-02-03 LAB — BASIC METABOLIC PANEL WITH GFR
Anion gap: 10 (ref 5–15)
BUN: 8 mg/dL (ref 8–23)
CO2: 25 mmol/L (ref 22–32)
Calcium: 9.3 mg/dL (ref 8.9–10.3)
Chloride: 103 mmol/L (ref 98–111)
Creatinine, Ser: 0.73 mg/dL (ref 0.44–1.00)
GFR, Estimated: >60 mL/min (ref >60)
Glucose, Bld: 103 mg/dL — ABNORMAL HIGH (ref 70–99)
Potassium: 3.7 mmol/L (ref 3.5–5.1)
Sodium: 138 mmol/L (ref 135–145)

## 2024-02-09 ENCOUNTER — Encounter (HOSPITAL_BASED_OUTPATIENT_CLINIC_OR_DEPARTMENT_OTHER): Payer: Self-pay | Admitting: Plastic Surgery

## 2024-02-09 ENCOUNTER — Ambulatory Visit (HOSPITAL_BASED_OUTPATIENT_CLINIC_OR_DEPARTMENT_OTHER)
Admission: RE | Admit: 2024-02-09 | Discharge: 2024-02-09 | Disposition: A | Discharge status: Home / Self Care | Attending: Plastic Surgery | Admitting: Plastic Surgery

## 2024-02-09 ENCOUNTER — Ambulatory Visit (HOSPITAL_BASED_OUTPATIENT_CLINIC_OR_DEPARTMENT_OTHER): Payer: Self-pay | Admitting: Certified Registered"

## 2024-02-09 ENCOUNTER — Other Ambulatory Visit: Payer: Self-pay

## 2024-02-09 ENCOUNTER — Encounter (HOSPITAL_BASED_OUTPATIENT_CLINIC_OR_DEPARTMENT_OTHER)
Admission: RE | Disposition: A | Discharge status: Home / Self Care | Payer: Self-pay | Source: Home / Self Care | Attending: Plastic Surgery

## 2024-02-09 DIAGNOSIS — H02834 Dermatochalasis of left upper eyelid: Secondary | ICD-10-CM | POA: Diagnosis not present

## 2024-02-09 DIAGNOSIS — I10 Essential (primary) hypertension: Secondary | ICD-10-CM

## 2024-02-09 DIAGNOSIS — H02831 Dermatochalasis of right upper eyelid: Secondary | ICD-10-CM | POA: Insufficient documentation

## 2024-02-09 DIAGNOSIS — J45909 Unspecified asthma, uncomplicated: Secondary | ICD-10-CM | POA: Diagnosis not present

## 2024-02-09 DIAGNOSIS — F419 Anxiety disorder, unspecified: Secondary | ICD-10-CM | POA: Diagnosis not present

## 2024-02-09 DIAGNOSIS — K219 Gastro-esophageal reflux disease without esophagitis: Secondary | ICD-10-CM | POA: Diagnosis not present

## 2024-02-09 HISTORY — PX: BROW LIFT: SHX178

## 2024-02-09 SURGERY — BLEPHAROPLASTY
Anesthesia: Monitor Anesthesia Care | Site: Eye | Laterality: Bilateral

## 2024-02-09 MED ORDER — PROPOFOL 500 MG/50ML IV EMUL
INTRAVENOUS | Status: DC | PRN
  Administered 2024-02-09: 75 ug/kg/min via INTRAVENOUS

## 2024-02-09 MED ORDER — CEFAZOLIN SODIUM-DEXTROSE 2-4 GM/100ML-% IV SOLN
2.0000 g | INTRAVENOUS | Status: AC
  Administered 2024-02-09: 2 g via INTRAVENOUS

## 2024-02-09 MED ORDER — POVIDONE-IODINE 5 % OP SOLN
OPHTHALMIC | Status: DC | PRN
  Administered 2024-02-09: 1

## 2024-02-09 MED ORDER — 0.9 % SODIUM CHLORIDE (POUR BTL) OPTIME
TOPICAL | Status: DC | PRN
  Administered 2024-02-09: 1000 mL

## 2024-02-09 MED ORDER — ACETAMINOPHEN 325 MG PO TABS: 650.0000 mg | ORAL_TABLET | ORAL | Status: DC | PRN

## 2024-02-09 MED ORDER — DEXMEDETOMIDINE HCL IN NACL 80 MCG/20ML IV SOLN
INTRAVENOUS | Status: DC | PRN
  Administered 2024-02-09: 8 ug via INTRAVENOUS

## 2024-02-09 MED ORDER — FENTANYL CITRATE (PF) 100 MCG/2ML IJ SOLN
INTRAMUSCULAR | Status: DC | PRN
  Administered 2024-02-09: 50 ug via INTRAVENOUS

## 2024-02-09 MED ORDER — DEXMEDETOMIDINE HCL IN NACL 80 MCG/20ML IV SOLN
INTRAVENOUS | Status: AC
  Filled 2024-02-09: qty 20

## 2024-02-09 MED ORDER — OXYCODONE HCL 5 MG PO TABS: 5.0000 mg | ORAL_TABLET | ORAL | Status: DC | PRN

## 2024-02-09 MED ORDER — MIDAZOLAM HCL 2 MG/2ML IJ SOLN
INTRAMUSCULAR | Status: AC
Start: 2024-02-09 — End: 2024-02-09
  Filled 2024-02-09: qty 2

## 2024-02-09 MED ORDER — SODIUM CHLORIDE 0.9 % IV SOLN: 250.0000 mL | INTRAVENOUS | Status: DC | PRN

## 2024-02-09 MED ORDER — MIDAZOLAM HCL 2 MG/2ML IJ SOLN
INTRAMUSCULAR | Status: DC | PRN
Start: 2024-02-09 — End: 2024-02-09
  Administered 2024-02-09: 2 mg via INTRAVENOUS

## 2024-02-09 MED ORDER — FENTANYL CITRATE (PF) 100 MCG/2ML IJ SOLN: 25.0000 ug | INTRAMUSCULAR | Status: DC | PRN

## 2024-02-09 MED ORDER — PROPOFOL 500 MG/50ML IV EMUL
INTRAVENOUS | Status: AC
  Filled 2024-02-09: qty 50

## 2024-02-09 MED ORDER — SODIUM CHLORIDE 0.9% FLUSH
3.0000 mL | INTRAVENOUS | Status: DC | PRN
Start: 2024-02-09 — End: 2024-02-09

## 2024-02-09 MED ORDER — LACTATED RINGERS IV SOLN: INTRAVENOUS | Status: DC

## 2024-02-09 MED ORDER — BSS IO SOLN
INTRAOCULAR | Status: DC | PRN
  Administered 2024-02-09: 15 mL

## 2024-02-09 MED ORDER — ONDANSETRON HCL 4 MG/2ML IJ SOLN
INTRAMUSCULAR | Status: DC | PRN
  Administered 2024-02-09: 4 mg via INTRAVENOUS

## 2024-02-09 MED ORDER — TOBRAMYCIN 0.3 % OP OINT
TOPICAL_OINTMENT | OPHTHALMIC | Status: DC | PRN
Start: 2024-02-09 — End: 2024-02-09
  Administered 2024-02-09: 1 via OPHTHALMIC

## 2024-02-09 MED ORDER — KETOROLAC TROMETHAMINE 30 MG/ML IJ SOLN
INTRAMUSCULAR | Status: AC
  Filled 2024-02-09: qty 1

## 2024-02-09 MED ORDER — FENTANYL CITRATE (PF) 100 MCG/2ML IJ SOLN
INTRAMUSCULAR | Status: AC
  Filled 2024-02-09: qty 2

## 2024-02-09 MED ORDER — OXYCODONE HCL 5 MG PO TABS: 5.0000 mg | ORAL_TABLET | Freq: Once | ORAL | Status: DC | PRN

## 2024-02-09 MED ORDER — CHLORHEXIDINE GLUCONATE CLOTH 2 % EX PADS: 6.0000 | MEDICATED_PAD | Freq: Once | CUTANEOUS | Status: DC

## 2024-02-09 MED ORDER — ONDANSETRON HCL 4 MG/2ML IJ SOLN: 4.0000 mg | Freq: Once | INTRAMUSCULAR | Status: DC | PRN

## 2024-02-09 MED ORDER — ACETAMINOPHEN 325 MG RE SUPP: 650.0000 mg | RECTAL | Status: DC | PRN

## 2024-02-09 MED ORDER — SODIUM CHLORIDE 0.9% FLUSH: 3.0000 mL | Freq: Two times a day (BID) | INTRAVENOUS | Status: DC

## 2024-02-09 MED ORDER — OXYCODONE HCL 5 MG/5ML PO SOLN: 5.0000 mg | Freq: Once | ORAL | Status: DC | PRN

## 2024-02-09 MED ORDER — TOBRAMYCIN-DEXAMETHASONE 0.3-0.1 % OP OINT
TOPICAL_OINTMENT | OPHTHALMIC | Status: AC
  Filled 2024-02-09: qty 3.5

## 2024-02-09 MED ORDER — CEFAZOLIN SODIUM-DEXTROSE 2-4 GM/100ML-% IV SOLN
INTRAVENOUS | Status: AC
Start: 2024-02-09 — End: 2024-02-09
  Filled 2024-02-09: qty 100

## 2024-02-09 MED ORDER — PROPOFOL 10 MG/ML IV BOLUS
INTRAVENOUS | Status: AC
  Filled 2024-02-09: qty 20

## 2024-02-09 MED ORDER — LIDOCAINE-EPINEPHRINE 1 %-1:100000 IJ SOLN
INTRAMUSCULAR | Status: DC | PRN
  Administered 2024-02-09: 2 mL

## 2024-02-09 MED ORDER — BUPIVACAINE HCL (PF) 0.25 % IJ SOLN
INTRAMUSCULAR | Status: AC
  Filled 2024-02-09: qty 30

## 2024-02-09 SURGICAL SUPPLY — 32 items
APPLICATOR COTTON TIP 6 STRL (MISCELLANEOUS) ×2 IMPLANT
APPLICATOR DR MATTHEWS STRL (MISCELLANEOUS) ×1 IMPLANT
BLADE SURG 15 STRL LF DISP TIS (BLADE) ×2 IMPLANT
BNDG EYE OVAL 2 1/8 X 2 5/8 (GAUZE/BANDAGES/DRESSINGS) ×2 IMPLANT
CORD BIPOLAR FORCEPS 12FT (ELECTRODE) ×1 IMPLANT
COVER BACK TABLE 60X90IN (DRAPES) ×1 IMPLANT
COVER MAYO STAND STRL (DRAPES) ×1 IMPLANT
DRAPE U-SHAPE 76X120 STRL (DRAPES) ×1 IMPLANT
ELECT NDL BLADE 2-5/6 (NEEDLE) ×1 IMPLANT
ELECT NEEDLE BLADE 2-5/6 (NEEDLE) ×1 IMPLANT
ELECTRODE REM PT RTRN 9FT ADLT (ELECTROSURGICAL) ×1 IMPLANT
GLOVE BIO SURGEON STRL SZ 6.5 (GLOVE) ×2 IMPLANT
GLOVE BIO SURGEON STRL SZ7.5 (GLOVE) IMPLANT
GLOVE BIOGEL PI IND STRL 7.0 (GLOVE) IMPLANT
GLOVE BIOGEL PI IND STRL 8 (GLOVE) IMPLANT
GOWN STRL REUS W/ TWL LRG LVL3 (GOWN DISPOSABLE) ×2 IMPLANT
GOWN STRL REUS W/TWL XL LVL3 (GOWN DISPOSABLE) IMPLANT
NDL HYPO 30GX1 BEV (NEEDLE) ×1 IMPLANT
NEEDLE HYPO 30GX1 BEV (NEEDLE) ×1 IMPLANT
PACK BASIN DAY SURGERY FS (CUSTOM PROCEDURE TRAY) ×1 IMPLANT
PENCIL SMOKE EVACUATOR (MISCELLANEOUS) ×1 IMPLANT
SLEEVE SCD COMPRESS KNEE MED (STOCKING) ×1 IMPLANT
SPIKE FLUID TRANSFER (MISCELLANEOUS) IMPLANT
STRIP CLOSURE SKIN 1/2X4 (GAUZE/BANDAGES/DRESSINGS) ×1 IMPLANT
STRIP SUTURE WOUND CLOSURE 1/2 (MISCELLANEOUS) IMPLANT
SUT CHROMIC 6 0 G 1 (SUTURE) IMPLANT
SUT CHROMIC 6 0 PS 4 (SUTURE) ×1 IMPLANT
SUT MNCRL 6-0 UNDY P1 1X18 (SUTURE) IMPLANT
SUT PLAIN 6 0 TG1408 (SUTURE) ×1 IMPLANT
SYR CONTROL 10ML LL (SYRINGE) ×1 IMPLANT
TOWEL GREEN STERILE FF (TOWEL DISPOSABLE) ×2 IMPLANT
TRAY DSU PREP LF (CUSTOM PROCEDURE TRAY) ×1 IMPLANT

## 2024-02-09 NOTE — Interval H&P Note (Signed)
 History and Physical Interval Note:  02/09/2024 8:19 AM  [name] [name]  has presented today for surgery, with the diagnosis of dermatochalasis bilateral.  The various methods of treatment have been discussed with the patient and family. After consideration of risks, benefits and other options for treatment, the patient has consented to  Procedure(s): BLEPHAROPLASTY (Bilateral) as a surgical intervention.  The patient's history has been reviewed, patient examined, no change in status, stable for surgery.  I have reviewed the patient's chart and labs.  Questions were answered to the patient's satisfaction.     [name] [name]

## 2024-02-09 NOTE — Discharge Instructions (Addendum)
 INSTRUCTIONS FOR AFTER SURGERY   You will likely have some questions about what to expect following your operation.  The following information will help you and your family understand what to expect when you are discharged from the hospital.  It is important to follow these guidelines to help ensure a smooth recovery and reduce complication.  Postoperative instructions include information on: diet, wound care, medications and physical activity.  AFTER SURGERY Expect to go home after the procedure.  In some cases, you may need to spend one night in the hospital for observation.  DIET Surgery does not require a specific diet.  However, the healthier you eat the better your body will heal. It is important to increasing your protein intake.  This means limiting the foods with sugar and carbohydrates.  Focus on vegetables and some meat.  If you have liposuction during your procedure be sure to drink water.  If your urine is bright yellow, then it is concentrated, and you need to drink more water.  As a general rule after surgery, you should have 8 ounces of water every hour while awake.  If you find you are persistently nauseated or unable to take in liquids let us  know.  NO TOBACCO USE or EXPOSURE.  This will slow your healing process and lead to a wound.  WOUND CARE If you have steri-strips / tape directly attached to your skin leave them in place. It is OK to get these wet.   No baths, pools or hot tubs for four weeks. We close your incision to leave the smallest and best-looking scar. No ointment or creams on your incisions for four weeks.  No Neosporin (Too many skin reactions).  A few weeks after surgery you can use Mederma and start massaging the scar. We ask you to wear your binder or sports bra for the first 6 weeks around the clock, including while sleeping. This provides added comfort and helps reduce the fluid accumulation at the surgery site. NO Ice or heating pads to the operative site.  You  have a very high risk of a BURN before you feel the temperature change.  ACTIVITY No heavy lifting until cleared by the doctor.  This usually means no more than a half-gallon of milk.  It is OK to walk and climb stairs. Moving your legs is very important to decrease your risk of a blood clot.  It will also help keep you from getting deconditioned.  Every 1 to 2 hours get up and walk for 5 minutes. This will help with a quicker recovery back to normal.  Let pain be your guide so you don't do too much.  This time is for you to recover.  You will be more comfortable if you sleep and rest with your head elevated either with a few pillows under you or in a recliner.  No stomach sleeping for a three months.  WORK Everyone returns to work at different times. As a rough guide, most people take at least 1 - 2 weeks off prior to returning to work. If you need documentation for your job, give the forms to the front staff at the clinic.  DRIVING Arrange for someone to bring you home from the hospital after your surgery.  You may be able to drive a few days after surgery but not while taking any narcotics or valium.  BOWEL MOVEMENTS Constipation can occur after anesthesia and while taking pain medication.  It is important to stay ahead for your comfort.  We recommend taking Milk of Magnesia (2 tablespoons; twice a day) while taking the pain pills.  MEDICATIONS You may be prescribed should start after surgery At your preoperative visit for you history and physical you may have been given the following medications: An antibiotic: Start this medication when you get home and take according to the instructions on the bottle. Zofran  4 mg:  This is to treat nausea and vomiting.  You can take this every 6 hours as needed and only if needed. Norco (hydrocodone/acetaminophen ) 5/325 mg:  This is only to be used after you have taken the Motrin or the Tylenol . Every 8 hours as needed.   Over the counter Medication to  take: Ibuprofen (Motrin) 600 mg:  Take this every 6 hours.  If you have additional pain then take 500 mg of the Tylenol  every 8 hours.  Only take the Norco after you have tried these two. MiraLAX  or Milk of Magnesia: Take this according to the bottle if you take the Norco.  WHEN TO CALL Call your surgeon's office if any of the following occur: Fever 101 degrees F or greater Excessive bleeding or fluid from the incision site. Pain that increases over time without aid from the medications Redness, warmth, or pus draining from incision sites Persistent nausea or inability to take in liquids Severe misshapen area that underwent the operation.   Post Anesthesia Home Care Instructions  Activity: Get plenty of rest for the remainder of the day. A responsible individual must stay with you for 24 hours following the procedure.  For the next 24 hours, DO NOT: -Drive a car -Advertising copywriter -Drink alcoholic beverages -Take any medication unless instructed by your physician -Make any legal decisions or sign important papers.  Meals: Start with liquid foods such as gelatin or soup. Progress to regular foods as tolerated. Avoid greasy, spicy, heavy foods. If nausea and/or vomiting occur, drink only clear liquids until the nausea and/or vomiting subsides. Call your physician if vomiting continues.  Special Instructions/Symptoms: Your throat may feel dry or sore from the anesthesia or the breathing tube placed in your throat during surgery. If this causes discomfort, gargle with warm salt water. The discomfort should disappear within 24 hours.

## 2024-02-09 NOTE — Transfer of Care (Signed)
 Immediate Anesthesia Transfer of Care Note  Patient: [name] [name]  Procedure(s) Performed: BLEPHAROPLASTY (Bilateral: Eye)  Patient Location: PACU  Anesthesia Type:MAC  Level of Consciousness: awake, alert , and oriented  Airway & Oxygen Therapy: Patient Spontanous Breathing and Patient connected to nasal cannula oxygen  Post-op Assessment: Report given to RN and Post -op Vital signs reviewed and stable  Post vital signs: Reviewed and stable  Last Vitals:  Vitals Value Taken Time  BP 122/80 02/09/24 09:30  Temp    Pulse 62 02/09/24 09:32  Resp 13 02/09/24 09:32  SpO2 94 % 02/09/24 09:32  Vitals shown include unfiled device data.  Last Pain:  Vitals:   02/09/24 0704  TempSrc: Oral  PainSc: 0-No pain         Complications: No notable events documented.

## 2024-02-09 NOTE — Anesthesia Preprocedure Evaluation (Signed)
 Anesthesia Evaluation  Patient identified by MRN, date of birth, ID band Patient awake    Reviewed: Allergy & Precautions, NPO status , Patient's Chart, lab work & pertinent test results, reviewed documented beta blocker date and time   History of Anesthesia Complications Negative for: history of anesthetic complications  Airway Mallampati: III  TM Distance: >3 FB     Dental no notable dental hx.    Pulmonary neg shortness of breath, asthma (rare inhaler) , neg COPD, neg recent URI   breath sounds clear to auscultation       Cardiovascular hypertension, (-) CAD, (-) Past MI and (-) Cardiac Stents  Rhythm:Regular Rate:Normal     Neuro/Psych neg Seizures PSYCHIATRIC DISORDERS Anxiety        GI/Hepatic ,GERD  Medicated and Controlled,,(+) Cirrhosis         Endo/Other    Renal/GU Renal disease     Musculoskeletal  (+) Arthritis ,    Abdominal   Peds  Hematology   Anesthesia Other Findings   Reproductive/Obstetrics                              Anesthesia Physical Anesthesia Plan  ASA: 3  Anesthesia Plan: MAC   Post-op Pain Management:    Induction: Intravenous  PONV Risk Score and Plan: 2 and Ondansetron  and Propofol  infusion  Airway Management Planned: Natural Airway and Simple Face Mask  Additional Equipment:   Intra-op Plan:   Post-operative Plan: Extubation in OR  Informed Consent: I have reviewed the patients History and Physical, chart, labs and discussed the procedure including the risks, benefits and alternatives for the proposed anesthesia with the patient or authorized representative who has indicated his/her understanding and acceptance.     Dental advisory given  Plan Discussed with: CRNA  Anesthesia Plan Comments:          Anesthesia Quick Evaluation

## 2024-02-09 NOTE — Anesthesia Postprocedure Evaluation (Signed)
 Anesthesia Post Note  Patient: [name] [name]  Procedure(s) Performed: BLEPHAROPLASTY (Bilateral: Eye)     Patient location during evaluation: PACU Anesthesia Type: MAC Level of consciousness: awake and alert Pain management: pain level controlled Vital Signs Assessment: post-procedure vital signs reviewed and stable Respiratory status: spontaneous breathing, nonlabored ventilation, respiratory function stable and patient connected to nasal cannula oxygen Cardiovascular status: stable and blood pressure returned to baseline Postop Assessment: no apparent nausea or vomiting Anesthetic complications: no   No notable events documented.  Last Vitals:  Vitals:   02/09/24 0944 02/09/24 0955  BP: 121/79 (!) 140/87  Pulse: 62 68  Resp: 18 17  Temp:  (!) 36.2 C  SpO2: 94% 94%    Last Pain:  Vitals:   02/09/24 0955  TempSrc: Temporal  PainSc: 0-No pain                 Lynwood MARLA Cornea

## 2024-02-09 NOTE — Op Note (Signed)
 Operative Note  Date of operation: 02/09/2024  Patient: [name] [name], MRN: [id number], 68 y.o. female.  Date of birth:  12-Feb-1956  Location: [facility]  Preoperative Diagnosis: Bilateral upper lid dermatochalasis  Postoperative Diagnosis: Same  Procedure: Bilateral Upper lid blepharoplasty  Surgeon: Estefana Fritter, DO  Assistant: Honora Seip, PA  Condition: Stable  Complications: None   EBL: Minimal  Disposition: Recovery Room  Indications: To prevent complications and improve visual field function.  Improve quality of life.  The patient presented with concerns regarding heavy upper eyelids with a decrease visual field.  This made it difficult with activities and reading.  It was better with manual elevation of the upper eyelids or extending the neck.  The clinical exam and tests confirmed the need for bilateral upper eyelid blepharoplasty.  Surgical risks and benefits were discussed in detail and are in the chart.  Procedure in detail: Patient was seen on the morning of the procedure after anesthesia evaluation. The patient was marked. All questions were answered.  The patient was then taken to the operating room.  Anesthesia was administered and a time out was called with all information confirmed to be correct.  The patient was prepped and draped with an opthalmologic betadine  prep. The markings were confirmed. Local anesthetic consisting of 1% lidocaine  with 1:100,000 units of epinephrine  was injected into the upper lids. The local was given several minutes to take effect.  Right upper lid.  The redundant upper eyelid was measured with 20 mm of upper eyelid skin to remain.  A # 15 blade was used to incise the upper eyelid skin. Hemostasis was achieved with bipolar cautery.  The dissection was carried down past the orbicularis to expose the fat.  There did not seem to be excess fat in the upper lid. The incisions were closed with 6-0 Chromic as a  running subcuticular.  Left upper lid.  The redundant upper eyelid was measured with 20 mm of upper eyelid skin to remain.  A # 15 blade was used to incise the upper eyelid skin.  Hemostasis was achieved with bipolar cautery.  The dissection was carried down past the orbicularis to expose the fat.  There did not seem to be excess fat in the upper lid. The incisions were closed with 6-0 Chromic as a running subcuticular.  The incisions were washed.  Ice was applied and patient was allowed to wake up and was taken to the recovery room with no apparent complications. Family was notified at the end of the case.   The advanced practice practitioner (APP) assisted throughout the case.  The APP was essential in retraction and counter traction when needed to make the case progress smoothly.  This retraction and assistance made it possible to see the tissue plans for the procedure.  The assistance was needed for blood control, tissue re-approximation and assisted with closure of the incision site.

## 2024-02-10 ENCOUNTER — Encounter (HOSPITAL_BASED_OUTPATIENT_CLINIC_OR_DEPARTMENT_OTHER): Payer: Self-pay | Admitting: Plastic Surgery

## 2024-02-18 ENCOUNTER — Encounter: Admitting: Physician Assistant

## 2024-02-21 ENCOUNTER — Ambulatory Visit: Admitting: Licensed Clinical Social Worker

## 2024-02-21 DIAGNOSIS — F411 Generalized anxiety disorder: Secondary | ICD-10-CM | POA: Diagnosis not present

## 2024-02-21 NOTE — Progress Notes (Signed)
 [facility] Counselor/Therapist Progress Note  Patient ID: [name] [name], MRN: [id number]    Date: 02/21/24  Time Spent: 0801  am - 0900 am : 59 Minutes  Treatment Type: Individual Therapy.  Reported Symptoms: [name] [name] presented with symptoms of depression and anxiety related to the grief over her husbands passing. Patient reports that she cries daily and struggles to find things to do in the evenings to assist her with staying busy. Patient states she has been having issues with her blood pressure likely related to her stress.   Mental Status Exam: Appearance:  Neat  Behavior: Appropriate  Motor: WNL  Speech/Language:  Clear and Coherent  Affect: Appropriate  Mood: Sad  Thought process: normal  Thought content:   WNL  Sensory/Perceptual disturbances:   WNL  Orientation: oriented to person, place, time/date, situation, day of week, month of year, and year  Attention: Good  Concentration: Good  Memory: WNL  Fund of knowledge:  Good  Insight:   Good  Judgment:  Good  Impulse Control: Good    Risk Assessment: Danger to Self:  No Self-injurious Behavior: No Danger to Others: No Duty to Warn:no Physical Aggression / Violence:No  Access to Firearms a concern: No  Gang Involvement:No    Subjective:    [name] [name] participated from office located at [facility] with Clinician present. [name] [name] consented to treatment.  [name] [name] presented for her session reporting that she has continued to struggle with grief and loneliness. [name] [name] reports that the weekends are very difficult and Sundays tend to be the worst.  [name] [name] states that she tries to stay on her routine and be busy but she gets up so early that by 10 am on the weekends her working out and activities are complete, which allow for a lot of idle time. Patient reports that she has joined a Ship broker and has agreed to taking swim lessons with another friend. She reports that on Sunday she was having a rough time  and gave in and called a friend to have coffee. She states that was helpful and she realizes that she will need to do that more often. [name] [name] was tearful and reports that she misses her husband every day and she wants to be able to find the new normal in her life.   Clinician actively listened and processed with patient her grief and emotions. Clinician and patient discussed being busy as well as being able to process her emotions and the stages of grief and how they aren't completed in a certain order nor do you experience one and it goes away but rather a range of emotions. Clinician encouraged patient to reach out to friends and allow them to provide support that they have offered. Clinician also discussed with patient the idea of a grief group and if she would be interested.   [name] [name] was actively engaged in discussion and polite and cooperative. [name] [name] is motivated for treatment and exhibits insight via her positive coping habits. [name] [name] will utilize coping skills to assist her in coping with the loss of her husband. [name] [name] will continue to engage in bi weekly CBT therapy sessions.Patient is to use CBT, mindfulness and coping skills to help manage decrease symptoms associated with their diagnosis.   Interventions: Cognitive Behavioral Therapy, Motivational Interviewing, Solution-Oriented/Positive Psychology, and Grief Therapy  Diagnosis: Generalized Anxiety Disorder    Damien Junk MSW, LCSW/DATE 02/21/2024

## 2024-02-21 NOTE — Progress Notes (Unsigned)
 Patient is a pleasant 68 year old female s/p upper eyelid blepharoplasty performed 02/09/2024 by Dr. Lowery who presents to clinic for postoperative follow-up.  Today,

## 2024-02-22 ENCOUNTER — Encounter: Payer: Self-pay | Admitting: Physician Assistant

## 2024-02-22 ENCOUNTER — Ambulatory Visit (INDEPENDENT_AMBULATORY_CARE_PROVIDER_SITE_OTHER): Admitting: Physician Assistant

## 2024-02-22 VITALS — BP 162/86 | HR 78 | Ht 63.0 in | Wt 140.2 lb

## 2024-02-22 DIAGNOSIS — Z9889 Other specified postprocedural states: Secondary | ICD-10-CM

## 2024-03-03 ENCOUNTER — Encounter: Admitting: Physician Assistant

## 2024-03-07 ENCOUNTER — Ambulatory Visit (INDEPENDENT_AMBULATORY_CARE_PROVIDER_SITE_OTHER): Admitting: Physician Assistant

## 2024-03-07 ENCOUNTER — Encounter: Payer: Self-pay | Admitting: Physician Assistant

## 2024-03-07 VITALS — BP 156/96 | HR 79 | Ht 63.0 in | Wt 142.8 lb

## 2024-03-07 DIAGNOSIS — Z9889 Other specified postprocedural states: Secondary | ICD-10-CM

## 2024-03-07 NOTE — Progress Notes (Signed)
 Patient is a pleasant 68 year old female s/p upper eyelid blepharoplasty performed 02/09/2024 by Dr. Lowery who presents to clinic for postoperative follow-up.   She was last seen in clinic on 02/22/2024.  At that time, her exam was entirely benign. Doing well from a postoperative standpoint.  Follow-up as scheduled.  Today, she is doing well.  She did notice a bump lateral to the right eye, unclear if it was related to surgery.  Otherwise, no complaints and is doing well.  She has been applying Vaseline, as directed.  She is looking forward to trying contact lenses again which was one of her motivations for having the blepharoplasty performed.  On exam, she is entirely well-healed from her blepharoplasty.  The skin lesion lateral to the right eye appears to be a nevus and can be seen in her preoperative photos.    She can transition to silicone scar gel, if she would like.  Thin scarring as of now.  She is pleased with the results.  Follow-up only as needed.

## 2024-03-13 ENCOUNTER — Ambulatory Visit: Admitting: Licensed Clinical Social Worker

## 2024-03-14 ENCOUNTER — Encounter: Admitting: Plastic Surgery

## 2024-03-27 ENCOUNTER — Ambulatory Visit: Admitting: Licensed Clinical Social Worker

## 2024-03-30 DIAGNOSIS — Z8551 Personal history of malignant neoplasm of bladder: Secondary | ICD-10-CM | POA: Diagnosis not present

## 2024-03-31 ENCOUNTER — Telehealth: Payer: Self-pay

## 2024-03-31 DIAGNOSIS — K7030 Alcoholic cirrhosis of liver without ascites: Secondary | ICD-10-CM

## 2024-03-31 NOTE — Telephone Encounter (Signed)
 [name] [name] informed to come for lab work. Advised of lab hours, no appointment needed. I told her we will contact her with results and plans. She has an EGD recall in for October 2025.

## 2024-03-31 NOTE — Telephone Encounter (Signed)
-----   Message from [facility] Eugene J sent at 10/29/2023  7:32 AM EDT ----- Contact patient and place orders to do CBC/diff, CMET, INR, AFP in October 2025 per Dr Avram.

## 2024-04-05 ENCOUNTER — Other Ambulatory Visit (INDEPENDENT_AMBULATORY_CARE_PROVIDER_SITE_OTHER)

## 2024-04-05 DIAGNOSIS — K7030 Alcoholic cirrhosis of liver without ascites: Secondary | ICD-10-CM

## 2024-04-06 LAB — CBC WITH DIFFERENTIAL/PLATELET
Basophils Absolute: 0.1 K/uL (ref 0.0–0.1)
Basophils Relative: 1.9 % (ref 0.0–3.0)
Eosinophils Absolute: 0.1 K/uL (ref 0.0–0.7)
Eosinophils Relative: 1.2 % (ref 0.0–5.0)
HCT: 43.5 % (ref 36.0–46.0)
Hemoglobin: 14.9 g/dL (ref 12.0–15.0)
Lymphocytes Relative: 31.4 % (ref 12.0–46.0)
Lymphs Abs: 2 K/uL (ref 0.7–4.0)
MCHC: 34.3 g/dL (ref 30.0–36.0)
MCV: 93.8 fl (ref 78.0–100.0)
Monocytes Absolute: 0.9 K/uL (ref 0.1–1.0)
Monocytes Relative: 13.3 % — ABNORMAL HIGH (ref 3.0–12.0)
Neutro Abs: 3.4 K/uL (ref 1.4–7.7)
Neutrophils Relative %: 52.2 % (ref 43.0–77.0)
Platelets: 277 K/uL (ref 150.0–400.0)
RBC: 4.64 Mil/uL (ref 3.87–5.11)
RDW: 13.6 % (ref 11.5–15.5)
WBC: 6.5 K/uL (ref 4.0–10.5)

## 2024-04-06 LAB — COMPREHENSIVE METABOLIC PANEL WITH GFR
ALT: 7 U/L (ref 0–35)
AST: 14 U/L (ref 0–37)
Albumin: 3.9 g/dL (ref 3.5–5.2)
Alkaline Phosphatase: 66 U/L (ref 39–117)
BUN: 22 mg/dL (ref 6–23)
CO2: 23 meq/L (ref 19–32)
Calcium: 9.2 mg/dL (ref 8.4–10.5)
Chloride: 105 meq/L (ref 96–112)
Creatinine, Ser: 1.37 mg/dL — ABNORMAL HIGH (ref 0.40–1.20)
GFR: 39.44 mL/min — ABNORMAL LOW (ref >60.00)
Glucose, Bld: 100 mg/dL — ABNORMAL HIGH (ref 70–99)
Potassium: 4 meq/L (ref 3.5–5.1)
Sodium: 140 meq/L (ref 135–145)
Total Bilirubin: 0.3 mg/dL (ref 0.2–1.2)
Total Protein: 7.8 g/dL (ref 6.0–8.3)

## 2024-04-06 LAB — PROTIME-INR
INR: 1.2 ratio — ABNORMAL HIGH (ref 0.8–1.0)
Prothrombin Time: 12.8 s (ref 9.6–13.1)

## 2024-04-10 ENCOUNTER — Ambulatory Visit: Admitting: Licensed Clinical Social Worker

## 2024-04-10 DIAGNOSIS — F411 Generalized anxiety disorder: Secondary | ICD-10-CM

## 2024-04-10 LAB — AFP TUMOR MARKER: AFP-Tumor Marker: 4.9 ng/mL

## 2024-04-10 NOTE — Progress Notes (Signed)
 [facility] Counselor/Therapist Progress Note  Patient ID: [name] [name], MRN: [id number]    Date: 04/10/24  Time Spent: 0803  am - 0904 am : 61 Minutes  Treatment Type: Individual Therapy.  Reported Symptoms: [name] [name] presented with symptoms of depression and anxiety related to the grief over her husbands passing. Patient reports that she cries daily and struggles to find things to do in the evenings to assist her with staying busy. Patient states she has been having issues with her blood pressure likely related to her stress.   Mental Status Exam: Appearance:  Neat  Behavior: Appropriate  Motor: WNL  Speech/Language:  Clear and Coherent  Affect: Appropriate  Mood: Sad  Thought process: normal  Thought content:   WNL  Sensory/Perceptual disturbances:   WNL  Orientation: oriented to person, place, time/date, situation, day of week, month of year, and year  Attention: Good  Concentration: Good  Memory: WNL  Fund of knowledge:  Good  Insight:   Good  Judgment:  Good  Impulse Control: Good    Risk Assessment: Danger to Self:  No Self-injurious Behavior: No Danger to Others: No Duty to Warn:no Physical Aggression / Violence:No  Access to Firearms a concern: No  Gang Involvement:No    Subjective:    [name] [name] participated from office located at [facility] with Clinician present. [name] [name] consented to treatment.  [name] [name] presented for her session stating she has been struggling with her grief. [name] [name] states that she has been feeling lonely especially in the evening. [name] [name] states she is still trying to stay on  a routine and be social with her friends in her walking group. [name] [name] states she never thought she could feel so broken. [name] [name] reports that she finds the evenings are the worst.   Clinician actively listened to patient and provided comfort and support. Clinician provided a safe space for patient to share her thoughts and grief. Clinician encouraged  patient to work on changing her evening and weekend routine in order to improve her loneliness. Clinician encouraged patient to practice self-compassion, acknowledge and allow your emotions, maintain healthy routines, seek support from others.   [name] [name]  was fully engaged in session. She was tearful at times, but was transparent with Clinician about her grief. [name] [name] remains active and attempts to utilize coping skills to assist with both her grief and anxiety. [name] [name] will use CBT, mindfulness and coping skills to help manage decrease symptoms associated with their diagnosis. Treatment planning to be reviewed by 01/31/2025.  Interventions: Cognitive Behavioral Therapy, Dialectical Behavioral Therapy, Mindfulness Meditation, Solution-Oriented/Positive Psychology, and Grief Therapy  Diagnosis: Generalized Anxiety Disorder   Damien Junk MSW, LCSW/DATE 04/10/2024

## 2024-04-13 ENCOUNTER — Ambulatory Visit: Payer: Self-pay | Admitting: Internal Medicine

## 2024-04-13 DIAGNOSIS — R7989 Other specified abnormal findings of blood chemistry: Secondary | ICD-10-CM

## 2024-04-13 DIAGNOSIS — N289 Disorder of kidney and ureter, unspecified: Secondary | ICD-10-CM

## 2024-04-13 NOTE — Telephone Encounter (Signed)
 I called [name] [name].  Her renal function is abnormal now.  She feels okay.  She has not been ill.  She is on furosemide  and spironolactone  at low-dose.  We will stop those at this point and recheck labs in early November.  She knows to come then.  She will monitor her weight and let me know if she thinks she is retaining fluid.

## 2024-04-19 IMAGING — US US ABDOMEN COMPLETE
1 series · 14 of 25 positions shown · non-contrast
Comparison: Abdominal ultrasound 04/17/2021

CLINICAL DATA: Cirrhosis

EXAM:
ABDOMEN ULTRASOUND COMPLETE

[Series 1: us abdomen complete · 14 of 90 slices shown]
[im 1/90]
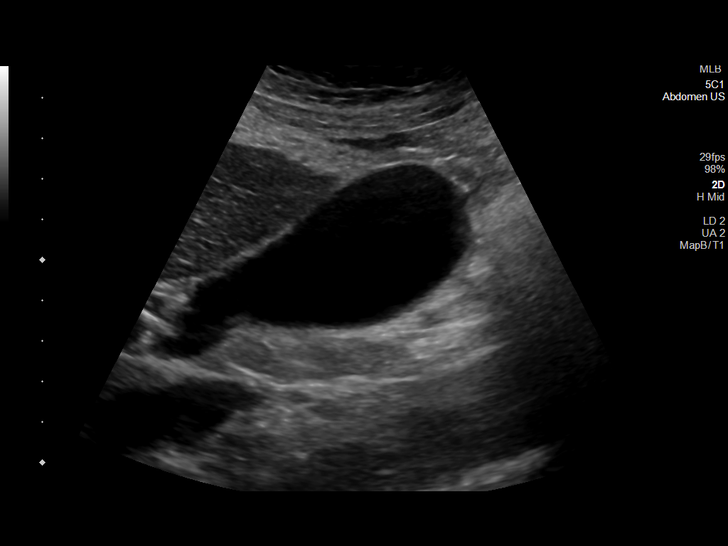
[im 8/90]
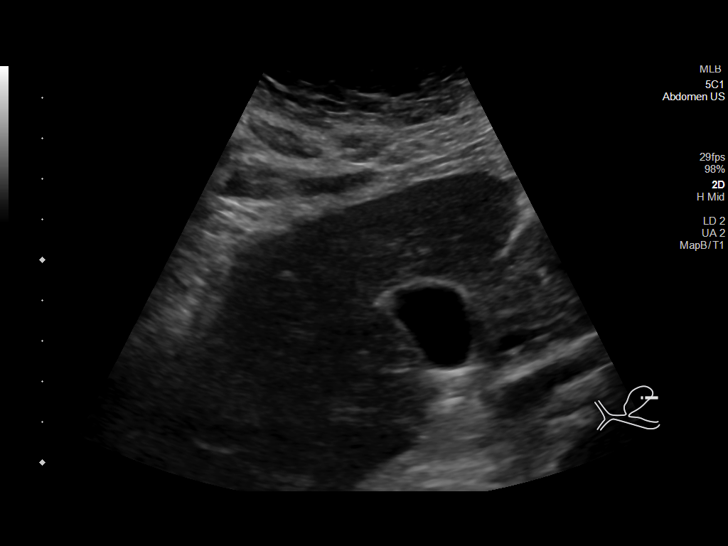
[im 15/90]
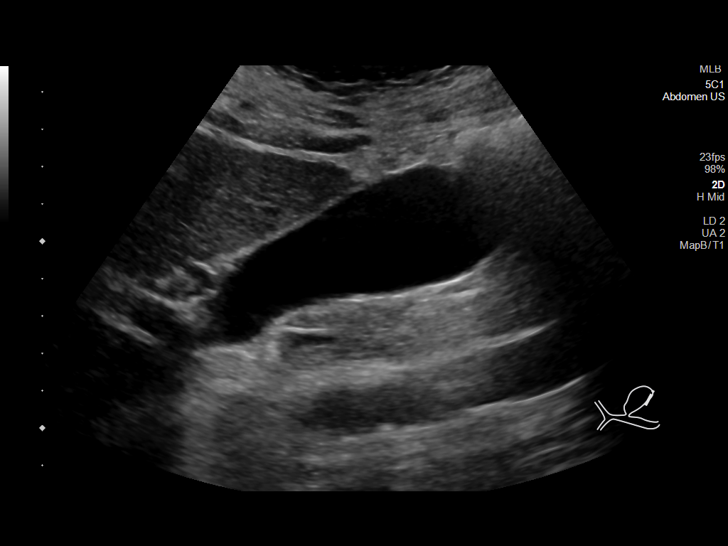
[im 23/90]
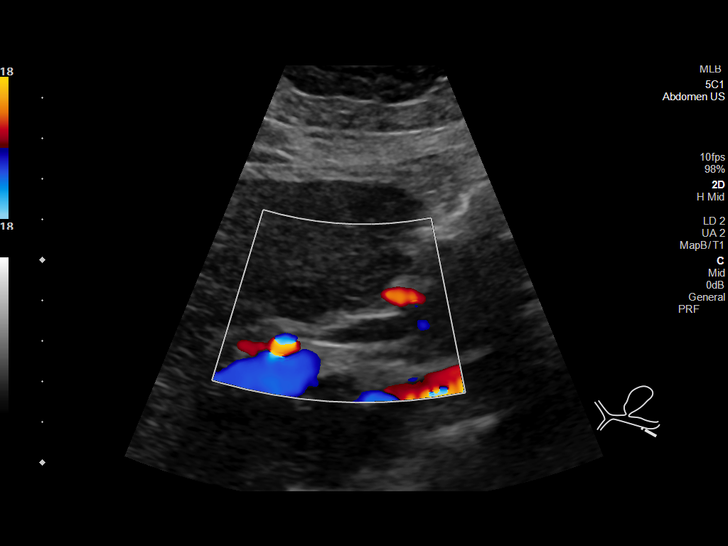
[im 30/90]
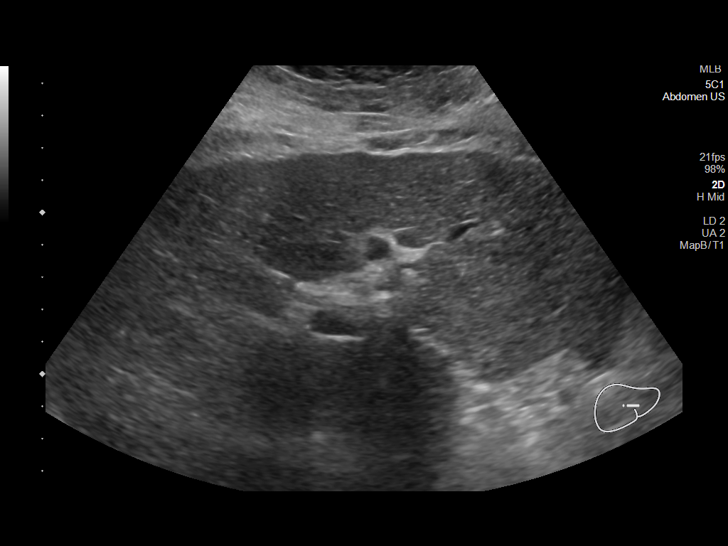
[im 34/90]
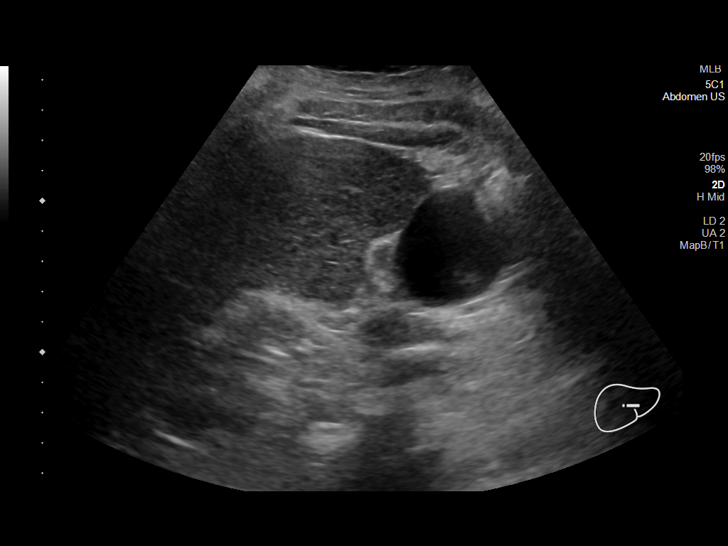
[im 41/90]
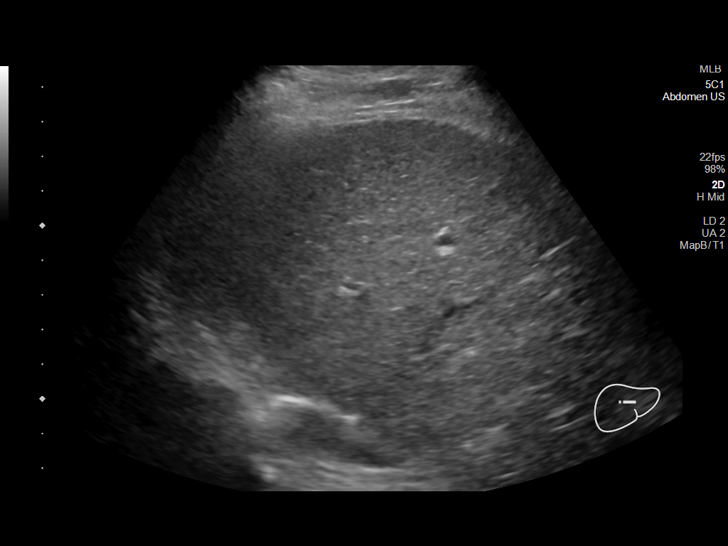
[im 49/90]
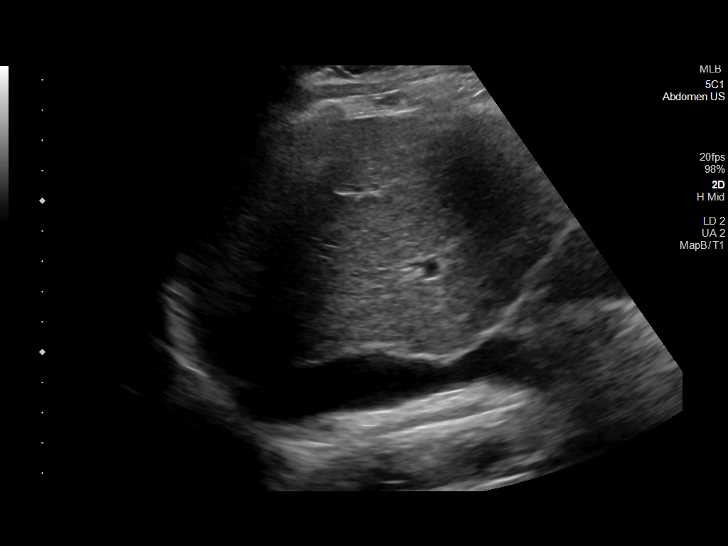
[im 56/90]
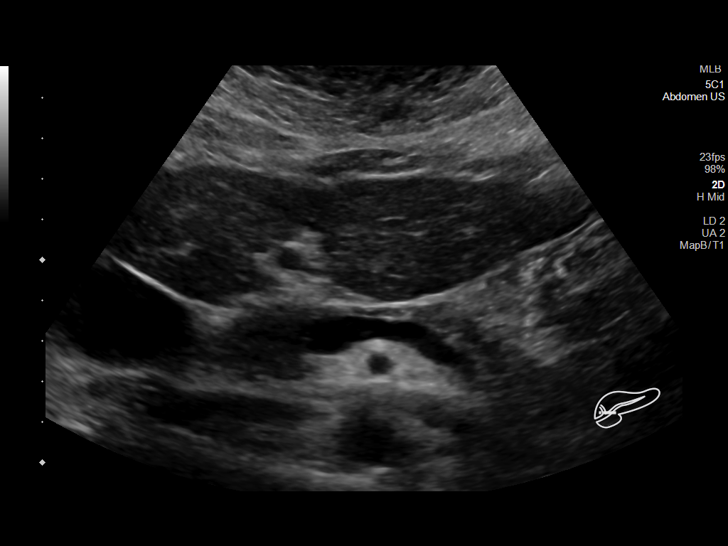
[im 60/90]
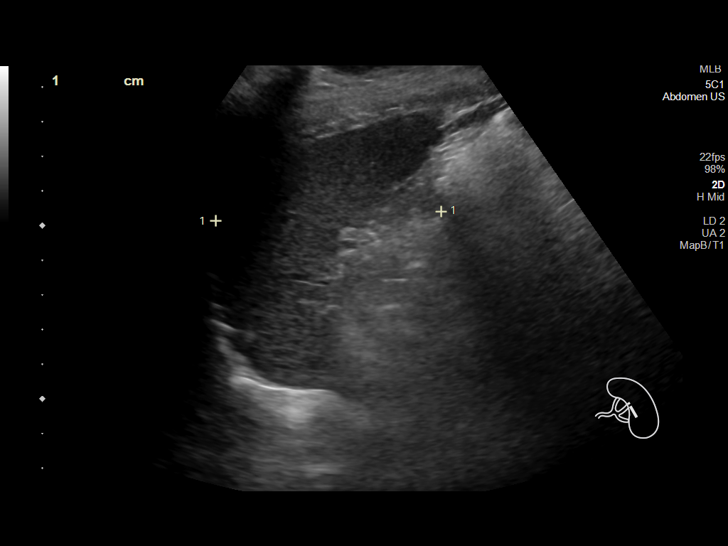
[im 67/90]
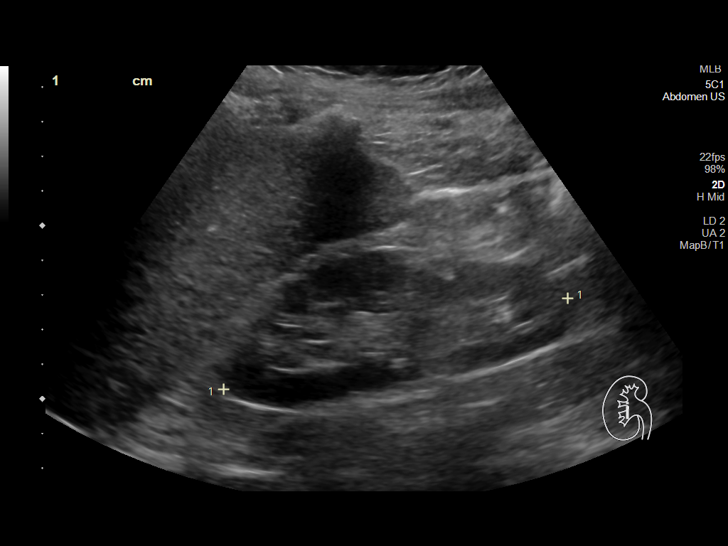
[im 75/90]
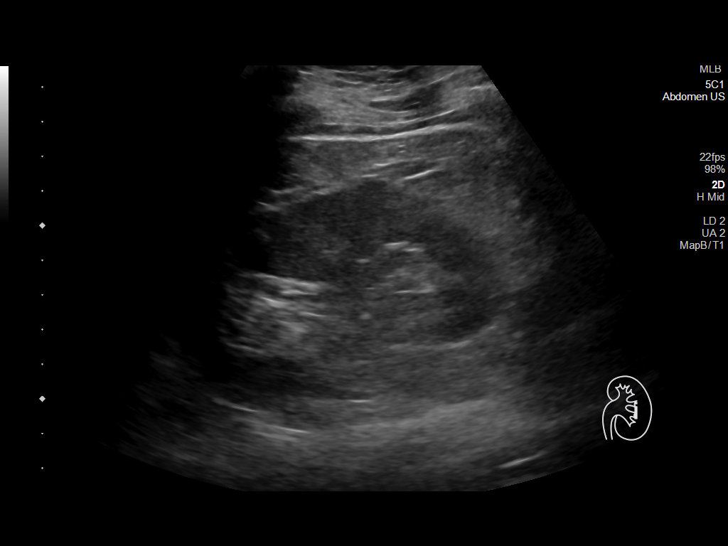
[im 82/90]
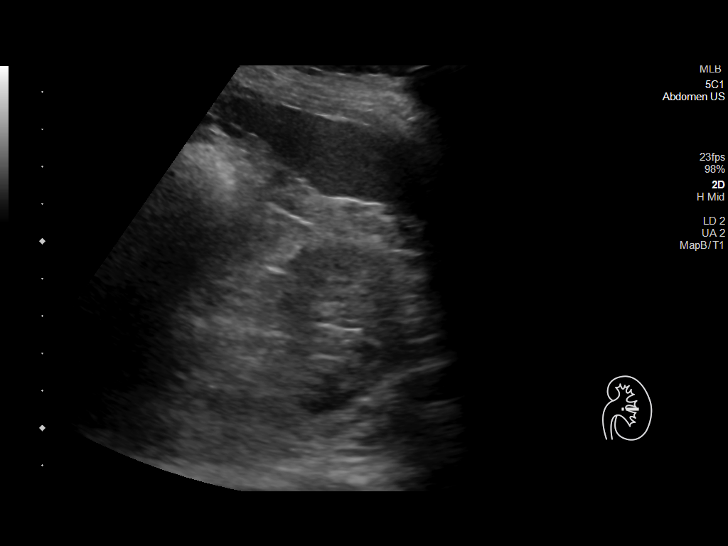
[im 90/90]
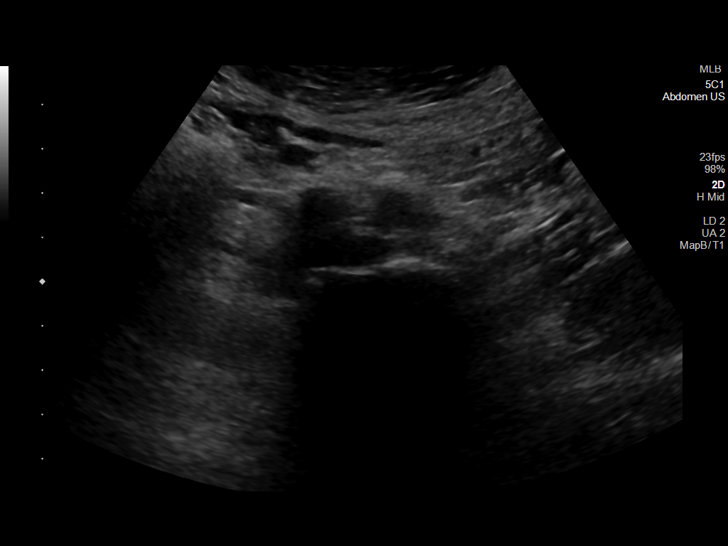

[14 of 25 positions shown; findings below may reference images not displayed]

FINDINGS: Gallbladder: No gallstones or wall thickening visualized. No
sonographic Murphy sign noted by sonographer.

Common bile duct: Diameter: 4 mm

Liver: Nodular in contour and heterogeneous in echogenicity. No
focal lesion. Portal vein is patent on color Doppler imaging with
normal direction of blood flow towards the liver.

IVC: No abnormality visualized.

Pancreas: Visualized portion unremarkable.

Spleen: Size and appearance within normal limits.

Right Kidney: Length: 10.6 cm. Echogenicity within normal limits. No
mass or hydronephrosis visualized.

Left Kidney: Length: 10.2 cm. Echogenicity within normal limits. No
mass or hydronephrosis visualized.

Abdominal aorta: No aneurysm visualized.

Other findings: None.
IMPRESSION: Morphologic changes compatible with cirrhosis.  No focal lesion.

No cholelithiasis or sonographic evidence for acute cholecystitis.

## 2024-05-02 ENCOUNTER — Ambulatory Visit: Admitting: Licensed Clinical Social Worker

## 2024-05-08 ENCOUNTER — Other Ambulatory Visit (INDEPENDENT_AMBULATORY_CARE_PROVIDER_SITE_OTHER)

## 2024-05-08 ENCOUNTER — Ambulatory Visit: Payer: Self-pay | Admitting: Internal Medicine

## 2024-05-08 DIAGNOSIS — R7989 Other specified abnormal findings of blood chemistry: Secondary | ICD-10-CM

## 2024-05-08 LAB — BASIC METABOLIC PANEL WITH GFR
BUN: 8 mg/dL (ref 6–23)
CO2: 26 meq/L (ref 19–32)
Calcium: 9.5 mg/dL (ref 8.4–10.5)
Chloride: 100 meq/L (ref 96–112)
Creatinine, Ser: 0.69 mg/dL (ref 0.40–1.20)
GFR: 88.53 mL/min (ref >60.00)
Glucose, Bld: 115 mg/dL — ABNORMAL HIGH (ref 70–99)
Potassium: 3.4 meq/L — ABNORMAL LOW (ref 3.5–5.1)
Sodium: 140 meq/L (ref 135–145)

## 2024-05-09 ENCOUNTER — Other Ambulatory Visit: Payer: Self-pay | Admitting: Internal Medicine

## 2024-05-09 ENCOUNTER — Encounter: Payer: Self-pay | Admitting: Internal Medicine

## 2024-05-09 DIAGNOSIS — K7030 Alcoholic cirrhosis of liver without ascites: Secondary | ICD-10-CM

## 2024-05-10 ENCOUNTER — Ambulatory Visit: Admitting: Licensed Clinical Social Worker

## 2024-05-10 DIAGNOSIS — F411 Generalized anxiety disorder: Secondary | ICD-10-CM | POA: Diagnosis not present

## 2024-05-12 DIAGNOSIS — H5203 Hypermetropia, bilateral: Secondary | ICD-10-CM | POA: Diagnosis not present

## 2024-05-13 NOTE — Progress Notes (Signed)
 [facility] Counselor/Therapist Progress Note  Patient ID: [name] [name], MRN: [id number]    Date: 05/10/2024   Time Spent: 0500  pm - 0600 pm : 60 Minutes  Treatment Type: Group Therapy.  Group Topic: Understanding and Accepting Loss  Facilitator began group by a brief introduction of herself and co facilitator. The group members discussed pre-established norms, include confidentiality, not interrupting, and listening respectfully. Facilitator also identified the various types of loss and that grief is not specific to death. Types of loss identified and discussed were:  Loss of a child, miscarriage, or stillbirth Loss of a parent or family member (for children and teens) Traumatic loss Loss due to suicide or homicide Loss due to community violence or natural disasters Loss related to substance use or COVID-19 Loss of a spouse or partner Loss of a pet Ambiguous loss (e.g., due to serious mental illness) Loss due to dementia  Understanding and accepting grief involves allowing yourself to feel its emotions, recognizing that grief is a personal and non-linear process, and taking steps to cope with the new reality. Key strategies include expressing your feelings through talking or journaling, maintaining a routine and taking care of your physical health, seeking support from friends and family, and getting professional help if needed. Acceptance is not the absence of pain, but rather not resisting the reality of the loss and finding a way to live with it.   We also discussed the details of the group  and what to expect for the next 6 weeks of the group. Group members were ask to introduce themselves and share any information they are comfortable with and the relationship of the person they lost or the reason behind their grief. We also used an [employer] activity to identify not carrying the burden of grief alone. Clinician prompted group members to identify why we don't ask  for help.   [name] was engaged in group discussion and was eager to share and listen to other group members. [name] identified that she hopes to gain insight on ways to cope with her emotions.   Damien Junk MSW, LCSW/DATE 05/10/2024

## 2024-05-16 ENCOUNTER — Ambulatory Visit: Admitting: Licensed Clinical Social Worker

## 2024-05-16 DIAGNOSIS — F411 Generalized anxiety disorder: Secondary | ICD-10-CM | POA: Diagnosis not present

## 2024-05-16 NOTE — Progress Notes (Signed)
 [facility] Counselor/Therapist Progress Note  Patient ID: [name] [name], MRN: [id number]    Date: 05/16/24  Time Spent: 0803  am - 0900 am : 57 Minutes  Treatment Type: Individual Therapy.  Reported Symptoms: Aron presented with symptoms of depression and anxiety related to the grief over her husbands passing. Patient reports that she cries daily and struggles to find things to do in the evenings to assist her with staying busy. Patient states she has been having issues with her blood pressure likely related to her stress.   Mental Status Exam: Appearance:  Neat  Behavior: Appropriate  Motor: WNL  Speech/Language:  Clear and Coherent  Affect: Appropriate  Mood: Sad  Thought process: normal  Thought content:   WNL  Sensory/Perceptual disturbances:   WNL  Orientation: oriented to person, place, time/date, situation, day of week, month of year, and year  Attention: Good  Concentration: Good  Memory: WNL  Fund of knowledge:  Good  Insight:   Good  Judgment:  Good  Impulse Control: Good    Risk Assessment: Danger to Self:  No Self-injurious Behavior: No Danger to Others: No Duty to Warn:no Physical Aggression / Violence:No  Access to Firearms a concern: No  Gang Involvement:No    Subjective:    [name] [name] participated from office located at [facility] with Clinician present. Fabiha consented to treatment.   Audris presented for her session stating she has been remaining busy. Patient shares that she has been maintaining her daily routine. She reports that this has helped her to stay afloat. She states that she is still lonely and misses [name] [name] more during this season. She states that he loved Christmas and the lights. She reports that she often has such mixed emotions as she sees people with their families. [name] [name] remains tearful as she speaks of her spouse and her missing him. [name] [name] also shared having a bit of a uncomfortable conversation with a  friend who means well but lacks the understanding of what patient is currently going through. [name] [name] states that she found herself frustrated but felt that she remained calm and collected.  Clinician actively listened and provided support and encouragement. Clinician processed with patient her strong emotions and provided a safe space fro patient to express herself. Clinician also discussed with patient that people often have good intentions but truly don't understand until they have experienced the situation. Clinician encouraged patient to continue to remain active and engaged socially as much as possible.  [name] [name]  was fully engaged in session. She was tearful at times, but was transparent with Clinician about her grief. [name] [name] remains active and attempts to utilize coping skills to assist with both her grief and anxiety. Daphne will use CBT, mindfulness and coping skills to help manage decrease symptoms associated with their diagnosis. Treatment planning to be reviewed by 01/31/2025.   Interventions: Cognitive Behavioral Therapy, Dialectical Behavioral Therapy, Mindfulness Meditation, Solution-Oriented/Positive Psychology, and Grief Therapy   Diagnosis: Generalized Anxiety Disorder   Damien Junk MSW, LCSW/DATE 05/16/2024

## 2024-05-17 ENCOUNTER — Ambulatory Visit

## 2024-05-23 DIAGNOSIS — Z85828 Personal history of other malignant neoplasm of skin: Secondary | ICD-10-CM | POA: Diagnosis not present

## 2024-05-23 DIAGNOSIS — D1801 Hemangioma of skin and subcutaneous tissue: Secondary | ICD-10-CM | POA: Diagnosis not present

## 2024-05-23 DIAGNOSIS — L738 Other specified follicular disorders: Secondary | ICD-10-CM | POA: Diagnosis not present

## 2024-05-23 DIAGNOSIS — D225 Melanocytic nevi of trunk: Secondary | ICD-10-CM | POA: Diagnosis not present

## 2024-05-23 DIAGNOSIS — L821 Other seborrheic keratosis: Secondary | ICD-10-CM | POA: Diagnosis not present

## 2024-05-23 DIAGNOSIS — D2272 Melanocytic nevi of left lower limb, including hip: Secondary | ICD-10-CM | POA: Diagnosis not present

## 2024-05-29 ENCOUNTER — Other Ambulatory Visit (INDEPENDENT_AMBULATORY_CARE_PROVIDER_SITE_OTHER)

## 2024-05-29 DIAGNOSIS — K7030 Alcoholic cirrhosis of liver without ascites: Secondary | ICD-10-CM | POA: Diagnosis not present

## 2024-05-29 LAB — BASIC METABOLIC PANEL WITH GFR
BUN: 8 mg/dL (ref 6–23)
CO2: 31 meq/L (ref 19–32)
Calcium: 9.7 mg/dL (ref 8.4–10.5)
Chloride: 98 meq/L (ref 96–112)
Creatinine, Ser: 0.68 mg/dL (ref 0.40–1.20)
GFR: 88.81 mL/min (ref >60.00)
Glucose, Bld: 92 mg/dL (ref 70–99)
Potassium: 3.6 meq/L (ref 3.5–5.1)
Sodium: 137 meq/L (ref 135–145)

## 2024-05-30 ENCOUNTER — Ambulatory Visit: Admitting: Licensed Clinical Social Worker

## 2024-05-31 DIAGNOSIS — F331 Major depressive disorder, recurrent, moderate: Secondary | ICD-10-CM | POA: Diagnosis not present

## 2024-06-05 ENCOUNTER — Ambulatory Visit: Payer: Self-pay | Admitting: Internal Medicine

## 2024-06-06 DIAGNOSIS — F331 Major depressive disorder, recurrent, moderate: Secondary | ICD-10-CM | POA: Diagnosis not present

## 2024-06-09 ENCOUNTER — Encounter: Payer: Self-pay | Admitting: Internal Medicine

## 2024-06-12 ENCOUNTER — Encounter: Payer: Self-pay | Admitting: Internal Medicine

## 2024-06-13 ENCOUNTER — Ambulatory Visit: Admitting: Licensed Clinical Social Worker

## 2024-06-13 DIAGNOSIS — F331 Major depressive disorder, recurrent, moderate: Secondary | ICD-10-CM | POA: Diagnosis not present

## 2024-06-22 DIAGNOSIS — F331 Major depressive disorder, recurrent, moderate: Secondary | ICD-10-CM | POA: Diagnosis not present

## 2024-06-23 ENCOUNTER — Encounter: Payer: Self-pay | Admitting: Internal Medicine

## 2024-07-04 ENCOUNTER — Ambulatory Visit: Admitting: Licensed Clinical Social Worker

## 2024-07-07 ENCOUNTER — Other Ambulatory Visit (HOSPITAL_BASED_OUTPATIENT_CLINIC_OR_DEPARTMENT_OTHER): Payer: Self-pay

## 2024-07-07 ENCOUNTER — Encounter (INDEPENDENT_AMBULATORY_CARE_PROVIDER_SITE_OTHER): Payer: Commercial Managed Care - PPO | Admitting: Ophthalmology

## 2024-07-07 ENCOUNTER — Other Ambulatory Visit: Payer: Self-pay

## 2024-07-07 DIAGNOSIS — H43813 Vitreous degeneration, bilateral: Secondary | ICD-10-CM | POA: Diagnosis not present

## 2024-07-07 DIAGNOSIS — H3581 Retinal edema: Secondary | ICD-10-CM

## 2024-07-07 DIAGNOSIS — H35033 Hypertensive retinopathy, bilateral: Secondary | ICD-10-CM | POA: Diagnosis not present

## 2024-07-07 DIAGNOSIS — H3553 Other dystrophies primarily involving the sensory retina: Secondary | ICD-10-CM | POA: Diagnosis not present

## 2024-07-07 DIAGNOSIS — I10 Essential (primary) hypertension: Secondary | ICD-10-CM | POA: Diagnosis not present

## 2024-07-07 MED ORDER — POLYMYXIN B-TRIMETHOPRIM 10000-0.1 UNIT/ML-% OP SOLN
1.0000 [drp] | Freq: Four times a day (QID) | OPHTHALMIC | 12 refills | Status: AC
  Filled 2024-07-07: qty 10, 50d supply, fill #0

## 2024-07-12 ENCOUNTER — Other Ambulatory Visit: Payer: Self-pay | Admitting: Internal Medicine

## 2024-07-12 DIAGNOSIS — Z1231 Encounter for screening mammogram for malignant neoplasm of breast: Secondary | ICD-10-CM

## 2024-07-18 ENCOUNTER — Ambulatory Visit: Admitting: *Deleted

## 2024-07-18 VITALS — Ht 63.0 in | Wt 144.0 lb

## 2024-07-18 DIAGNOSIS — K7030 Alcoholic cirrhosis of liver without ascites: Secondary | ICD-10-CM

## 2024-07-18 NOTE — Progress Notes (Signed)
 Pt's name and DOB verified at the beginning of the pre-visit with 2 identifiers   Pt denies any difficulty with ambulating,sitting, laying down or rolling side to side  Pt has no issues moving head neck or swallowing  No egg or soy allergy known to patient   No issues known to pt with past sedation  No FH of Malignant Hyperthermia  Pt is not on home 02   Pt is not on blood thinners   Pt denies issues with constipation   Pt is not on dialysis  Pt denise any abnormal heart rhythms   Pt denies any upcoming cardiac testing  Patient's chart reviewed by Norleen Schillings CNRA prior to pre-visit and patient appropriate for the [facility].  Pre-visit completed and red dot placed by patient's name on their procedure day (on provider's schedule).    Pt states weight is 144 lb   Pt given  both [facility] main # and MD on call # prior to instructions.  Informed pt to come in at the time discussed and is shown on PV instructions.  Pt instructed to use Single[employer].com or GoodRx for a price reduction on prep  Instructed pt where to find PV instructions in My Chart. . Instructed pt on all aspects of written instructions including med holds clothing to wear and foods to eat and not eat as well as after procedure legal restrictions and to call MD on call if needed.. Pt states understanding. Instructed pt to review instructions again prior to procedure and call main # given if has any questions or any issues. Pt states they will.

## 2024-07-25 ENCOUNTER — Other Ambulatory Visit: Payer: Self-pay | Admitting: Internal Medicine

## 2024-07-25 ENCOUNTER — Other Ambulatory Visit (HOSPITAL_BASED_OUTPATIENT_CLINIC_OR_DEPARTMENT_OTHER): Payer: Self-pay

## 2024-07-25 ENCOUNTER — Other Ambulatory Visit (HOSPITAL_COMMUNITY): Payer: Self-pay

## 2024-07-26 ENCOUNTER — Encounter: Payer: Self-pay | Admitting: Internal Medicine

## 2024-07-28 ENCOUNTER — Telehealth: Payer: Self-pay | Admitting: Internal Medicine

## 2024-07-28 ENCOUNTER — Other Ambulatory Visit (HOSPITAL_BASED_OUTPATIENT_CLINIC_OR_DEPARTMENT_OTHER): Payer: Self-pay

## 2024-07-28 MED ORDER — FUROSEMIDE 20 MG PO TABS
20.0000 mg | ORAL_TABLET | Freq: Every day | ORAL | 0 refills | Status: AC | PRN
  Filled 2024-07-28: qty 30, 30d supply, fill #0

## 2024-07-28 NOTE — Telephone Encounter (Signed)
 I spoke with [name] [name] and she is requesting a refill on her furosemide  not the spironolactone . She started back on it last week as she was traveling on an airplane. She said she has taken it for 17 years. She had been off it 3-4 months per Dr Darilyn instructions. She has noticed swelling in her legs at the sock line. Her weight is up she states she weighs 144 lb. She thinks that is due to her eating chocolate and cupcakes since she can't drink. She told me she had lost her husband last year. She has an upcoming procedure week after next with Dr Avram. I told her I will send in #30 and she said she will use daily prn.

## 2024-07-28 NOTE — Telephone Encounter (Signed)
 Patient is requesting a refill for spironolactone . Please advise, thank you.

## 2024-08-02 ENCOUNTER — Encounter: Payer: Self-pay | Admitting: *Deleted

## 2024-08-03 ENCOUNTER — Ambulatory Visit (HOSPITAL_BASED_OUTPATIENT_CLINIC_OR_DEPARTMENT_OTHER): Admitting: Student

## 2024-08-03 ENCOUNTER — Ambulatory Visit (HOSPITAL_BASED_OUTPATIENT_CLINIC_OR_DEPARTMENT_OTHER)

## 2024-08-03 DIAGNOSIS — M25532 Pain in left wrist: Secondary | ICD-10-CM | POA: Diagnosis not present

## 2024-08-03 DIAGNOSIS — S52532A Colles' fracture of left radius, initial encounter for closed fracture: Secondary | ICD-10-CM

## 2024-08-03 NOTE — Progress Notes (Signed)
 "                                Chief Complaint: Left wrist injury     History of Present Illness:    [name] [name] is a 69 y.o. right-hand-dominant female who presents today for evaluation of the left wrist injury.  Patient fell earlier this afternoon on an outstretched left hand after slipping on ice while clearing the ice from her driveway.  She developed immediate pain in the left wrist which she now rates as moderate in severity.  She had taken ibuprofen shortly before the fall occurred.  Denies any head trauma or loss of consciousness.   Surgical History:   None  PMH/PSH/Family History/Social History/Meds/Allergies:    Past Medical History:  Diagnosis Date   Allergy    mild   Anxiety June 2024   Arthritis    Asthma    Cancer (HCC)    bladder cancer   Cirrhosis, alcoholic (HCC)    Esophageal varices (HCC)    GERD (gastroesophageal reflux disease)    Hypertension    Hyponatremia    Psoriasis    Past Surgical History:  Procedure Laterality Date   BASAL CELL CARCINOMA EXCISION Right    Ear   BLADDER SURGERY     Tumor removed    BREAST REDUCTION SURGERY     Bilateral    BROW LIFT Bilateral 02/09/2024   Procedure: BLEPHAROPLASTY;  Surgeon: Lowery Estefana RAMAN, DO;  Location: [facility];  Service: Plastics;  Laterality: Bilateral;   COLONOSCOPY     COSMETIC SURGERY  1992   REDUCTION MAMMAPLASTY     UPPER GASTROINTESTINAL ENDOSCOPY     Social History   Socioeconomic History   Marital status: Widowed    Spouse name: Not on file   Number of children: Not on file   Years of education: Not on file   Highest education level: Bachelor's degree (e.g., BA, AB, BS)  Occupational History   Not on file  Tobacco Use   Smoking status: Never   Smokeless tobacco: Never  Vaping Use   Vaping status: Never Used  Substance and Sexual Activity   Alcohol use: No   Drug use: No   Sexual activity: Not Currently    Birth control/protection:  Post-menopausal  Other Topics Concern   Not on file  Social History Narrative   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[employer]   Married-husband in hospice due to alcoholic liver disease 2025   Sober   Never smoker never drug use   No children   Social Drivers of Health   Tobacco Use: Low Risk (07/18/2024)   Patient History    Smoking Tobacco Use: Never    Smokeless Tobacco Use: Never    Passive Exposure: Not on file  Financial Resource Strain: Low Risk (07/30/2023)   Overall Financial Resource Strain (CARDIA)    Difficulty of Paying Living Expenses: Not hard at all  Food Insecurity: No Food Insecurity (08/14/2023)   Hunger Vital Sign    Worried About Running Out of Food in the Last Year: Never true    Ran Out of Food in the Last Year: Never true  Transportation Needs: No Transportation Needs (08/14/2023)   PRAPARE - Administrator, Civil Service (Medical): No    Lack of Transportation (Non-Medical): No  Physical Activity: Sufficiently Active (07/30/2023)   Exercise Vital Sign    Days of Exercise  per Week: 6 days    Minutes of Exercise per Session: 40 min  Stress: Stress Concern Present (07/30/2023)   [employer] of Occupational Health - Occupational Stress Questionnaire    Feeling of Stress : To some extent  Social Connections: Socially Integrated (08/14/2023)   Social Connection and Isolation Panel    Frequency of Communication with Friends and Family: Three times a week    Frequency of Social Gatherings with Friends and Family: Three times a week    Attends Religious Services: More than 4 times per year    Active Member of Clubs or Organizations: No    Attends Banker Meetings: 1 to 4 times per year    Marital Status: Married  Depression (PHQ2-9): Medium Risk (11/30/2023)   Depression (PHQ2-9)    PHQ-2 Score: 6  Alcohol Screen: Not on file  Housing: Low Risk (08/14/2023)   Housing Stability Vital Sign    Unable to Pay for Housing in the Last Year: No     Number of Times Moved in the Last Year: 0    Homeless in the Last Year: No  Utilities: Not At Risk (08/14/2023)   AHC Utilities    Threatened with loss of utilities: No  Health Literacy: Not on file   Family History  Problem Relation Age of Onset   Diabetes Paternal Grandmother    Diabetes Paternal Grandfather    Depression Mother    Alcohol abuse Brother    Cancer Brother    Early death Brother    Colon cancer Neg Hx    Colon polyps Neg Hx    Esophageal cancer Neg Hx    Rectal cancer Neg Hx    Stomach cancer Neg Hx    Allergies[1] Current Outpatient Medications  Medication Sig Dispense Refill   albuterol  (VENTOLIN  HFA) 108 (90 Base) MCG/ACT inhaler Inhale 2 puffs into the lungs every 4 (four) hours as needed. 6.7 g 5   amLODipine  (NORVASC ) 10 MG tablet Take 1 tablet (10 mg total) by mouth daily. 90 tablet 3   b complex vitamins tablet Take 1 tablet by mouth daily.     Calcium Citrate-Vitamin D  (CITRACAL + D PO) daily at 12 noon.     fluticasone  (FLONASE ) 50 MCG/ACT nasal spray Place 2 sprays into both nostrils daily. (Patient taking differently: Place 2 sprays into both nostrils as needed.) 48 g 3   furosemide  (LASIX ) 20 MG tablet Take 1 tablet (20 mg total) by mouth daily as needed. 30 tablet 0   omeprazole (PRILOSEC OTC) 20 MG tablet Take 20 mg by mouth daily.     Prenatal Multivit-Min-Fe-FA (PRE-NATAL FORMULA) TABS Take 1 tablet by mouth daily.     spironolactone  (ALDACTONE ) 25 MG tablet Take 1 tablet (25 mg total) by mouth daily. 90 tablet 3   trimethoprim -polymyxin b  (POLYTRIM ) ophthalmic solution Place 1 drop into the left eye 4 (four) times daily for 2 days AFTER each monthly eye injection. (Patient taking differently: Place 1 drop into the left eye as needed.) 10 mL 12   No current facility-administered medications for this visit.   No results found.  Review of Systems:   A ROS was performed including pertinent positives and negatives as documented in the  HPI.  Physical Exam :   Constitutional: NAD and appears stated age Neurological: Alert and oriented Psych: Appropriate affect and cooperative There were no vitals taken for this visit.   Comprehensive Musculoskeletal Exam:    Tenderness with palpation over the dorsal  distal radius with mild palpable deformity.  Distal ulna nontender.  No tenderness or deformity of the left elbow.  Radial pulse 2+.  Distal neurosensory exam intact.  Imaging:   Xray (left wrist 4 views): Distal radius fracture with mild dorsal angulation   I personally reviewed and interpreted the radiographs.   Assessment:   69 y.o. female who sustained a FOOSH injury of the left wrist due to a fall earlier today.  X-rays do show presence of a Colles' fracture of the distal radius.  She is neurovascularly intact.  Fracture appears mildly displaced therefore I do believe she would be a good candidate for conservative management.  Due to swelling, a wrist brace will be placed today for immobilization.  Discussed potential for casting as an alternative for more rigid immobilization.  Will plan to have patient follow-up next week for reassessment and potential casting at that time.  Plan :    - Wrist brace applied today and return to clinic next week for reassessment     I personally saw and evaluated the patient, and participated in the management and treatment plan.  Leonce Reveal, PA-C Orthopedics    [1]  Allergies Allergen Reactions   Lisinopril Other (See Comments)    Facial swelling  Prinivil   Sulfamethoxazole-Trimethoprim  Hives and Rash    REACTION: unspecified   "

## 2024-08-04 ENCOUNTER — Encounter (INDEPENDENT_AMBULATORY_CARE_PROVIDER_SITE_OTHER): Admitting: Ophthalmology

## 2024-08-04 ENCOUNTER — Encounter (INDEPENDENT_AMBULATORY_CARE_PROVIDER_SITE_OTHER): Payer: Self-pay

## 2024-08-07 ENCOUNTER — Ambulatory Visit: Admitting: Orthopedic Surgery

## 2024-08-07 ENCOUNTER — Encounter: Admitting: Internal Medicine

## 2024-08-09 ENCOUNTER — Other Ambulatory Visit: Payer: Self-pay

## 2024-08-09 ENCOUNTER — Ambulatory Visit (HOSPITAL_BASED_OUTPATIENT_CLINIC_OR_DEPARTMENT_OTHER): Admitting: Student

## 2024-08-09 ENCOUNTER — Ambulatory Visit: Admitting: Orthopedic Surgery

## 2024-08-09 ENCOUNTER — Other Ambulatory Visit: Payer: Self-pay | Admitting: Internal Medicine

## 2024-08-09 ENCOUNTER — Encounter (HOSPITAL_BASED_OUTPATIENT_CLINIC_OR_DEPARTMENT_OTHER): Payer: Self-pay | Admitting: Orthopedic Surgery

## 2024-08-09 DIAGNOSIS — S52532A Colles' fracture of left radius, initial encounter for closed fracture: Secondary | ICD-10-CM

## 2024-08-09 NOTE — Progress Notes (Signed)
" °   08/09/24 1131  PAT Phone Screen  Is the patient taking a GLP-1 receptor agonist? No  Do You Have Diabetes? No  Do You Have Hypertension? (S)  Yes  Have You Ever Been to the ER for Asthma? No  Have You Taken Oral Steroids in the Past 3 Months? No  Do you Take Phenteramine or any Other Diet Drugs? No  Recent  Lab Work, EKG, CXR? No  Do you have a history of heart problems? No  Any Recent Hospitalizations? No  Height 5' 3 (1.6 m)  Weight 65.3 kg  Pat Appointment Scheduled (S)  Yes (EKG, BMP)    "

## 2024-08-10 ENCOUNTER — Encounter (HOSPITAL_BASED_OUTPATIENT_CLINIC_OR_DEPARTMENT_OTHER)
Admission: RE | Admit: 2024-08-10 | Discharge: 2024-08-10 | Disposition: A | Source: Ambulatory Visit | Attending: Orthopedic Surgery

## 2024-08-10 LAB — BASIC METABOLIC PANEL WITH GFR
Anion gap: 13 (ref 5–15)
BUN: 11 mg/dL (ref 8–23)
CO2: 26 mmol/L (ref 22–32)
Calcium: 9.4 mg/dL (ref 8.9–10.3)
Chloride: 102 mmol/L (ref 98–111)
Creatinine, Ser: 0.66 mg/dL (ref 0.44–1.00)
GFR, Estimated: >60 mL/min
Glucose, Bld: 90 mg/dL (ref 70–99)
Potassium: 4 mmol/L (ref 3.5–5.1)
Sodium: 141 mmol/L (ref 135–145)

## 2024-08-10 NOTE — Progress Notes (Signed)

## 2024-08-11 ENCOUNTER — Ambulatory Visit (HOSPITAL_BASED_OUTPATIENT_CLINIC_OR_DEPARTMENT_OTHER)
Admission: RE | Admit: 2024-08-11 | Discharge: 2024-08-11 | Disposition: A | Source: Home / Self Care | Attending: Orthopedic Surgery | Admitting: Orthopedic Surgery

## 2024-08-11 ENCOUNTER — Ambulatory Visit: Admission: RE | Admit: 2024-08-11 | Source: Ambulatory Visit

## 2024-08-11 ENCOUNTER — Other Ambulatory Visit: Payer: Self-pay

## 2024-08-11 ENCOUNTER — Encounter (HOSPITAL_BASED_OUTPATIENT_CLINIC_OR_DEPARTMENT_OTHER): Payer: Self-pay | Admitting: Orthopedic Surgery

## 2024-08-11 ENCOUNTER — Ambulatory Visit (HOSPITAL_BASED_OUTPATIENT_CLINIC_OR_DEPARTMENT_OTHER)

## 2024-08-11 ENCOUNTER — Other Ambulatory Visit (HOSPITAL_BASED_OUTPATIENT_CLINIC_OR_DEPARTMENT_OTHER): Payer: Self-pay

## 2024-08-11 ENCOUNTER — Encounter (HOSPITAL_BASED_OUTPATIENT_CLINIC_OR_DEPARTMENT_OTHER): Admission: RE | Disposition: A | Payer: Self-pay | Source: Home / Self Care | Attending: Orthopedic Surgery

## 2024-08-11 DIAGNOSIS — Z01818 Encounter for other preprocedural examination: Secondary | ICD-10-CM

## 2024-08-11 DIAGNOSIS — S52532A Colles' fracture of left radius, initial encounter for closed fracture: Secondary | ICD-10-CM

## 2024-08-11 DIAGNOSIS — I10 Essential (primary) hypertension: Secondary | ICD-10-CM

## 2024-08-11 DIAGNOSIS — Z1231 Encounter for screening mammogram for malignant neoplasm of breast: Secondary | ICD-10-CM

## 2024-08-11 MED ORDER — ONDANSETRON HCL 4 MG/2ML IJ SOLN
INTRAMUSCULAR | Status: DC | PRN
  Administered 2024-08-11: 4 mg via INTRAVENOUS

## 2024-08-11 MED ORDER — MIDAZOLAM HCL (PF) 2 MG/2ML IJ SOLN
1.0000 mg | Freq: Once | INTRAMUSCULAR | Status: AC
  Administered 2024-08-11: 1 mg via INTRAVENOUS

## 2024-08-11 MED ORDER — FENTANYL CITRATE (PF) 100 MCG/2ML IJ SOLN
INTRAMUSCULAR | Status: DC | PRN
  Administered 2024-08-11: 25 ug via INTRAVENOUS
  Administered 2024-08-11: 50 ug via INTRAVENOUS

## 2024-08-11 MED ORDER — LIDOCAINE 2% (20 MG/ML) 5 ML SYRINGE
INTRAMUSCULAR | Status: AC
  Filled 2024-08-11: qty 5

## 2024-08-11 MED ORDER — DROPERIDOL 2.5 MG/ML IJ SOLN: 0.6250 mg | Freq: Once | INTRAMUSCULAR | Status: DC | PRN

## 2024-08-11 MED ORDER — PROPOFOL 10 MG/ML IV BOLUS
INTRAVENOUS | Status: DC | PRN
  Administered 2024-08-11: 50 ug/kg/min via INTRAVENOUS
  Administered 2024-08-11: 30 mg via INTRAVENOUS

## 2024-08-11 MED ORDER — OXYCODONE HCL 5 MG PO TABS: 5.0000 mg | ORAL_TABLET | Freq: Once | ORAL | Status: DC | PRN

## 2024-08-11 MED ORDER — CEFAZOLIN SODIUM-DEXTROSE 2-4 GM/100ML-% IV SOLN
2.0000 g | INTRAVENOUS | Status: AC
  Administered 2024-08-11: 2 g via INTRAVENOUS

## 2024-08-11 MED ORDER — CEFAZOLIN SODIUM-DEXTROSE 2-4 GM/100ML-% IV SOLN
INTRAVENOUS | Status: AC
  Filled 2024-08-11: qty 100

## 2024-08-11 MED ORDER — ROPIVACAINE HCL 5 MG/ML IJ SOLN
INTRAMUSCULAR | Status: DC | PRN
  Administered 2024-08-11: 25 mL via PERINEURAL

## 2024-08-11 MED ORDER — DEXAMETHASONE SOD PHOSPHATE PF 10 MG/ML IJ SOLN
INTRAMUSCULAR | Status: DC | PRN
  Administered 2024-08-11: 5 mg via INTRAVENOUS

## 2024-08-11 MED ORDER — ACETAMINOPHEN 500 MG PO TABS
1000.0000 mg | ORAL_TABLET | Freq: Once | ORAL | Status: AC
  Administered 2024-08-11: 1000 mg via ORAL

## 2024-08-11 MED ORDER — OXYCODONE HCL 5 MG PO TABS
5.0000 mg | ORAL_TABLET | Freq: Four times a day (QID) | ORAL | 0 refills | Status: AC | PRN
  Filled 2024-08-11: qty 30, 8d supply, fill #0

## 2024-08-11 MED ORDER — LACTATED RINGERS IV SOLN: INTRAVENOUS | Status: DC

## 2024-08-11 MED ORDER — ACETAMINOPHEN 500 MG PO TABS
ORAL_TABLET | ORAL | Status: AC
  Filled 2024-08-11: qty 2

## 2024-08-11 MED ORDER — 0.9 % SODIUM CHLORIDE (POUR BTL) OPTIME
TOPICAL | Status: DC | PRN
  Administered 2024-08-11: 1000 mL

## 2024-08-11 MED ORDER — MIDAZOLAM HCL 2 MG/2ML IJ SOLN
INTRAMUSCULAR | Status: AC
  Filled 2024-08-11: qty 2

## 2024-08-11 MED ORDER — PROPOFOL 10 MG/ML IV BOLUS
INTRAVENOUS | Status: AC
  Filled 2024-08-11: qty 20

## 2024-08-11 MED ORDER — FENTANYL CITRATE (PF) 100 MCG/2ML IJ SOLN
INTRAMUSCULAR | Status: AC
  Filled 2024-08-11: qty 2

## 2024-08-11 MED ORDER — PROPOFOL 500 MG/50ML IV EMUL
INTRAVENOUS | Status: AC
  Filled 2024-08-11: qty 50

## 2024-08-11 MED ORDER — ONDANSETRON HCL 4 MG/2ML IJ SOLN
INTRAMUSCULAR | Status: AC
  Filled 2024-08-11: qty 2

## 2024-08-11 MED ORDER — FENTANYL CITRATE (PF) 100 MCG/2ML IJ SOLN
100.0000 ug | Freq: Once | INTRAMUSCULAR | Status: AC
  Administered 2024-08-11: 50 ug via INTRAVENOUS

## 2024-08-11 MED ORDER — OXYCODONE HCL 5 MG/5ML PO SOLN: 5.0000 mg | Freq: Once | ORAL | Status: DC | PRN

## 2024-08-11 MED ORDER — DEXAMETHASONE SOD PHOSPHATE PF 10 MG/ML IJ SOLN
INTRAMUSCULAR | Status: AC
  Filled 2024-08-11: qty 1

## 2024-08-11 MED ORDER — FENTANYL CITRATE (PF) 100 MCG/2ML IJ SOLN: 25.0000 ug | INTRAMUSCULAR | Status: DC | PRN

## 2024-08-11 NOTE — Discharge Instructions (Addendum)
 "   Hand Surgery Postop Instructions   Dressings: Maintain postoperative dressing until orthopedic follow-up.  Keep operative site clean and dry until orthopedic follow-up.  Wound Care: Keep your hand elevated above the level of your heart.  Do not allow it to dangle by your side. Moving your fingers is advised to stimulate circulation but will depend on the site of your surgery.  If you have a splint applied, your doctor will advise you regarding movement.  Activity: Do not drive or operate machinery until clearance given from physician. No heavy lifting with operative extremity.  Diet:  Drink liquids today or eat a light diet.  You may resume a regular diet tomorrow.    General expectations: Take prescribed medication if given, transition to over-the-counter medication as quickly as possible. Fingers may become slightly swollen.  Call your doctor if any of the following occur: Severe pain not relieved by pain medication. Elevated temperature. Dressing soaked with blood. Inability to move fingers. White or bluish color to fingers.   Per [facility] clinic policy, our goal is ensure optimal postoperative pain control with a multimodal pain management strategy. For all [facility] patients, our goal is to wean post-operative narcotic medications by 6 weeks post-operatively. If this is not possible due to utilization of pain medication prior to surgery, your [facility] doctor will support your acute post-operative pain control for the first 6 weeks postoperatively, with a plan to transition you back to your primary pain team following that. Maralee will work to ensure a therapist, occupational.  Anshul Afton Alderton, M.D. Hand Surgery [facility]   May take Tylenol  after 6 pm, if needed.    Post Anesthesia Home Care Instructions  Activity: Get plenty of rest for the remainder of the day. A responsible individual must stay with you for 24 hours following the procedure.  For  the next 24 hours, DO NOT: -Drive a car -Advertising copywriter -Drink alcoholic beverages -Take any medication unless instructed by your physician -Make any legal decisions or sign important papers.  Meals: Start with liquid foods such as gelatin or soup. Progress to regular foods as tolerated. Avoid greasy, spicy, heavy foods. If nausea and/or vomiting occur, drink only clear liquids until the nausea and/or vomiting subsides. Call your physician if vomiting continues.  Special Instructions/Symptoms: Your throat may feel dry or sore from the anesthesia or the breathing tube placed in your throat during surgery. If this causes discomfort, gargle with warm salt water. The discomfort should disappear within 24 hours.  If you had a scopolamine patch placed behind your ear for the management of post- operative nausea and/or vomiting:  1. The medication in the patch is effective for 72 hours, after which it should be removed.  Wrap patch in a tissue and discard in the trash. Wash hands thoroughly with soap and water. 2. You may remove the patch earlier than 72 hours if you experience unpleasant side effects which may include dry mouth, dizziness or visual disturbances. 3. Avoid touching the patch. Wash your hands with soap and water after contact with the patch.   Regional Anesthesia Blocks  1. You may not be able to move or feel the blocked extremity after a regional anesthetic block. This may last may last from 3-48 hours after placement, but it will go away. The length of time depends on the medication injected and your individual response to the medication. As the nerves start to wake up, you may experience tingling as the movement and feeling returns  to your extremity. If the numbness and inability to move your extremity has not gone away after 48 hours, please call your surgeon.   2. The extremity that is blocked will need to be protected until the numbness is gone and the strength has returned.  Because you cannot feel it, you will need to take extra care to avoid injury. Because it may be weak, you may have difficulty moving it or using it. You may not know what position it is in without looking at it while the block is in effect.  3. For blocks in the legs and feet, returning to weight bearing and walking needs to be done carefully. You will need to wait until the numbness is entirely gone and the strength has returned. You should be able to move your leg and foot normally before you try and bear weight or walk. You will need someone to be with you when you first try to ensure you do not fall and possibly risk injury.  4. Bruising and tenderness at the needle site are common side effects and will resolve in a few days.  5. Persistent numbness or new problems with movement should be communicated to the surgeon or the [facility] [phone] [facility] [phone]). "

## 2024-08-11 NOTE — Interval H&P Note (Signed)
 History and Physical Interval Note:  08/11/2024 12:29 PM  [name] [name]  has presented today for surgery, with the diagnosis of LEFT DISTAL RADIUS FRACTURE.  The various methods of treatment have been discussed with the patient and family. After consideration of risks, benefits and other options for treatment, the patient has consented to  Procedures: OPEN REDUCTION INTERNAL FIXATION (ORIF) DISTAL RADIUS FRACTURE (Left) as a surgical intervention.  The patient's history has been reviewed, patient examined, no change in status, stable for surgery.  I have reviewed the patient's chart and labs.  Questions were answered to the patient's satisfaction.     Nigel Wessman

## 2024-08-11 NOTE — Anesthesia Procedure Notes (Signed)
 Anesthesia Regional Block: Supraclavicular block   Pre-Anesthetic Checklist: , timeout performed,  Correct Patient, Correct Site, Correct Laterality,  Correct Procedure, Correct Position, site marked,  Risks and benefits discussed,  Surgical consent,  Pre-op evaluation,  At surgeon's request and post-op pain management  Laterality: Left  Prep: chloraprep       Needles:  Injection technique: Single-shot  Needle Type: Echogenic Stimulator Needle     Needle Length: 9cm  Needle Gauge: 21     Additional Needles:   Procedures:,,,, ultrasound used (permanent image in chart),,    Narrative:  Start time: 08/11/2024 1:00 PM End time: 08/11/2024 1:12 PM Injection made incrementally with aspirations every 5 mL.  Performed by: Personally  Anesthesiologist: Erma Thom SAUNDERS, MD  Additional Notes: Discussed risks and benefits of the nerve block in detail, including but not limited vascular injury, permanent nerve damage and infection.   Patient tolerated the procedure well. Local anesthetic introduced in an incremental fashion under minimal resistance after negative aspirations. No paresthesias were elicited. After completion of the procedure, no acute issues were identified and patient continued to be monitored by RN.

## 2024-08-11 NOTE — Progress Notes (Signed)
 Assisted Dr. Lyn Henri with left, supraclavicular, ultrasound guided block. Side rails up, monitors on throughout procedure. See vital signs in flow sheet. Tolerated Procedure well.

## 2024-08-11 NOTE — Transfer of Care (Signed)
 Immediate Anesthesia Transfer of Care Note  Patient: [name] [name]  Procedure(s) Performed: Procedures (LRB): OPEN REDUCTION INTERNAL FIXATION (ORIF) DISTAL RADIUS FRACTURE (Left)  Patient Location: PACU  Anesthesia Type: MAC  Level of Consciousness: awake, alert , oriented and patient cooperative  Airway & Oxygen Therapy: Patient Spontanous Breathing and Patient connected to face mask oxygen  Post-op Assessment: Report given to PACU RN and Post -op Vital signs reviewed and stable  Post vital signs: Reviewed and stable  Complications: No apparent anesthesia complications Last Vitals:  Vitals Value Taken Time  BP 136/77 08/11/24 15:11  Temp    Pulse 65 08/11/24 15:14  Resp 24 08/11/24 15:14  SpO2 95 % 08/11/24 15:14  Vitals shown include unfiled device data.  Last Pain:  Vitals:   08/11/24 1313  TempSrc:   PainSc: 0-No pain      Patients Stated Pain Goal: 5 (08/11/24 1227)  Complications: No notable events documented.

## 2024-08-11 NOTE — Anesthesia Preprocedure Evaluation (Addendum)
"                                    Anesthesia Evaluation    Airway        Dental   Pulmonary asthma           Cardiovascular hypertension,      Neuro/Psych  PSYCHIATRIC DISORDERS Anxiety        GI/Hepatic ,GERD  ,,(+) Cirrhosis   Esophageal Varices  substance abuse  alcohol useEsophageal varice   Endo/Other    Renal/GU    bladder cancer    Musculoskeletal Distal left radial fx   Abdominal   Peds  Hematology   Anesthesia Other Findings   Reproductive/Obstetrics                              Anesthesia Physical Anesthesia Plan  ASA: 3  Anesthesia Plan: MAC and Regional   Post-op Pain Management: Regional block*   Induction:   PONV Risk Score and Plan: 2 and Ondansetron , Dexamethasone , TIVA and Treatment may vary due to age or medical condition  Airway Management Planned: Simple Face Mask  Additional Equipment: None  Intra-op Plan:   Post-operative Plan:   Informed Consent: I have reviewed the patients History and Physical, chart, labs and discussed the procedure including the risks, benefits and alternatives for the proposed anesthesia with the patient or authorized representative who has indicated his/her understanding and acceptance.       Plan Discussed with: CRNA  Anesthesia Plan Comments:          Anesthesia Quick Evaluation  "

## 2024-08-11 NOTE — Op Note (Signed)
 NAME: [name] [name] MEDICAL RECORD NO: [id number] DATE OF BIRTH: September 26, 1955 FACILITY: [facility] LOCATION: [facility] PHYSICIAN: GILDARDO ALDERTON, MD   OPERATIVE REPORT   DATE OF PROCEDURE: 08/11/24    PREOPERATIVE DIAGNOSIS: Left distal radius fracture, intra-articular   POSTOPERATIVE DIAGNOSIS: Left distal radius fracture, intra-articular   PROCEDURE: Left distal radius fracture open reduction internal fixation, intra-articular 2 fragments   SURGEON:  Gildardo Alderton, M.D.   ASSISTANT: Joesph Dinsmore, OPA   ANESTHESIA:  Regional with sedation   INTRAVENOUS FLUIDS:  Per anesthesia flow sheet.   ESTIMATED BLOOD LOSS:  Minimal.   COMPLICATIONS:  None.   SPECIMENS:  none   TOURNIQUET TIME:    Total Tourniquet Time Documented: Upper Arm (Left) - 29 minutes Total: Upper Arm (Left) - 29 minutes    DISPOSITION:  Stable to PACU.   INDICATIONS: 69 year old female who sustained a mechanical fall onto the outstretched left wrist.  Upon clinical and radiographic workup was found to have a displaced, angulated and shortened distal radius fracture with questionable intra-articular involvement.  Extensive discussion was had with the patient regarding conservative or surgical treatment options.  Understanding her options and alternatives to surgery, patient elected to proceed with left distal radius fracture open reduction internal fixation.  Benefits of the procedure were to restore anatomy, promote healing and early mobilization.  Risks and benefits of surgery were discussed including the risks of infection, bleeding, scarring, stiffness, nerve injury, vascular injury, tendon injury, need for subsequent operation, hardware failure, nonunion, malunion.  She voiced understanding of these risks and elected to proceed.  OPERATIVE COURSE: Patient was seen and identified in the preoperative area and marked appropriately.  Surgical consent had been signed. Preoperative IV  antibiotic prophylaxis was given. She was transferred to the operating room and placed in supine position with the Left upper extremity on an arm board.  Sedation was induced by the anesthesiologist. A regional block had been performed by anesthesia in preoperative holding.    Left upper extremity was prepped and draped in normal sterile orthopedic fashion.  A surgical pause was performed between the surgeons, anesthesia, and operating room staff and all were in agreement as to the patient, procedure, and site of procedure.  Tourniquet was placed and padded appropriately to the left upper arm.  The arm was exsanguinated and the tourniquet was inflated to 250 mmHg.  Longitudinal incision was designed over the volar radial aspect of the wrist, stemming from just proximal to the wrist crease into the forearm.  15 blade was utilized for skin, blunt dissection was performed down, all neurovascular structures were protected in their entirety throughout.  Radial artery was identified and protected.  Standard FCR approach was used.  Fibers over the FCR tendon were sectioned longitudinally, tendon was retracted ulnarly and FC sheath was incised.  Blunt dissection performed, FPL also retracted ulnarly, after which pronator quadratus was elevated sharply from distal radius at the distal and radial borders in order to expose the underlying fracture site.  There was noted to be significant comminution at the fracture site.  Gentle reduction maneuver was performed of the fragments under biplanar fluoroscopy.  Once we were satisfied with our reduction, standard 4-hole [employer] plate of appropriate laterality was selected and temporarily affixed to bone utilizing a combination of clamp and K wires.  Biplanar fluoroscopy was once again utilized to confirm appropriate plate placement and maintenance of fracture reduction.  Shaft screw was then placed to secure the plate proximally.  Nonlocking  screw was utilized distally  initially to capture the dorsal comminution and restore volar tilt.  Volar tilt was noted to be restored appropriately, joint line was well reduced.  Remaining distal holes were filled utilizing locking screws taking care to ensure all screws remained extra-articular and beneath the joint line.  Nonlocking screw distally was then swapped for a locking screw of shorter variety to once again ensure screw remained well within bone.  Remaining shaft screws were all filled using combination of locking and nonlocking screws in bicortical fashion.  Distal radioulnar joint was then stressed and noted to be stable in all planes.  Final fluoroscopic views were taken utilizing biplanar fluoroscopy which confirmed appropriate plate placement, all fracture fragments had been secured appropriately and joint line had been restored as well.  Gentle flexion and extension of the wrist under fluoroscopy indicated stable appearance of the wrist with appropriately secured fracture fragments.  The tourniquet was deflated at 29 minutes.  Fingertips were pink with brisk capillary refill after deflation of tourniquet.  Copious irrigation was performed.  Layered closure was performed utilizing a combination of 3-0 Monocryl for subcutaneous layer and 4-0 Monocryl for the skin surface.  Sterile dressings were applied followed by application of a short arm volar slab splint utilizing plaster.  The operative drapes were broken down.  Sling was applied.  The patient was awoken from anesthesia safely and taken to PACU in stable condition.   Post-operative plan: The patient will recover in the post-anesthesia care unit and then be discharged home.  The patient will be non weight bearing on the left upper extremity in a short arm splint.   I will see the patient back in the office in 2 weeks for postoperative followup.    [name] Kulinski, MD Electronically signed, 08/11/24

## 2024-08-11 NOTE — Anesthesia Postprocedure Evaluation (Signed)
"   Anesthesia Post Note  Patient: [name] [name]  Procedure(s) Performed: OPEN REDUCTION INTERNAL FIXATION (ORIF) DISTAL RADIUS FRACTURE (Left: Wrist)     Patient location during evaluation: PACU Anesthesia Type: Regional Level of consciousness: awake and alert Pain management: pain level controlled Vital Signs Assessment: post-procedure vital signs reviewed and stable Respiratory status: spontaneous breathing, nonlabored ventilation and respiratory function stable Cardiovascular status: stable and blood pressure returned to baseline Anesthetic complications: no   No notable events documented.  Last Vitals:  Vitals:   08/11/24 1528 08/11/24 1530  BP:  130/88  Pulse:  65  Resp:  16  Temp:    SpO2: 95% 94%    Last Pain:  Vitals:   08/11/24 1511  TempSrc:   PainSc: 0-No pain                 Debby FORBES Like      "

## 2024-08-11 NOTE — Anesthesia Procedure Notes (Signed)
 Procedure Name: MAC Date/Time: 08/11/2024 1:56 PM  Performed by: Delayne Olam BIRCH, CRNAPre-anesthesia Checklist: Patient identified, Emergency Drugs available, Suction available, Patient being monitored and Timeout performed Oxygen Delivery Method: Simple face mask

## 2024-08-24 ENCOUNTER — Encounter: Admitting: Rehabilitative and Restorative Service Providers"

## 2024-08-24 ENCOUNTER — Encounter: Admitting: Orthopedic Surgery

## 2024-09-07 ENCOUNTER — Encounter: Admitting: Internal Medicine
